# Patient Record
Sex: Female | Born: 1985 | Race: Black or African American | Hispanic: No | Marital: Single | Smoking: Never smoker
Health system: Southern US, Community
[De-identification: ages and names within clinical notes are randomized; demographics above are authoritative.]

## PROBLEM LIST (undated history)

## (undated) DIAGNOSIS — R112 Nausea with vomiting, unspecified: Secondary | ICD-10-CM

## (undated) DIAGNOSIS — Z9889 Other specified postprocedural states: Secondary | ICD-10-CM

## (undated) DIAGNOSIS — E559 Vitamin D deficiency, unspecified: Secondary | ICD-10-CM

## (undated) DIAGNOSIS — F32A Depression, unspecified: Secondary | ICD-10-CM

## (undated) DIAGNOSIS — F419 Anxiety disorder, unspecified: Secondary | ICD-10-CM

## (undated) DIAGNOSIS — F329 Major depressive disorder, single episode, unspecified: Secondary | ICD-10-CM

## (undated) DIAGNOSIS — B019 Varicella without complication: Secondary | ICD-10-CM

## (undated) DIAGNOSIS — K829 Disease of gallbladder, unspecified: Secondary | ICD-10-CM

## (undated) DIAGNOSIS — D649 Anemia, unspecified: Secondary | ICD-10-CM

## (undated) DIAGNOSIS — K219 Gastro-esophageal reflux disease without esophagitis: Secondary | ICD-10-CM

## (undated) HISTORY — DX: Disease of gallbladder, unspecified: K82.9

## (undated) HISTORY — DX: Depression, unspecified: F32.A

## (undated) HISTORY — PX: CHOLECYSTECTOMY: SHX55

## (undated) HISTORY — DX: Anxiety disorder, unspecified: F41.9

## (undated) HISTORY — DX: Varicella without complication: B01.9

## (undated) HISTORY — DX: Vitamin D deficiency, unspecified: E55.9

## (undated) HISTORY — PX: WISDOM TOOTH EXTRACTION: SHX21

## (undated) SURGERY — MANOMETRY, ESOPHAGUS

---
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
## 2010-04-07 ENCOUNTER — Ambulatory Visit (HOSPITAL_COMMUNITY): Admission: RE | Admit: 2010-04-07 | Discharge: 2010-04-07 | Payer: Self-pay | Admitting: Gastroenterology

## 2010-07-05 ENCOUNTER — Emergency Department (HOSPITAL_COMMUNITY): Admission: EM | Admit: 2010-07-05 | Discharge: 2010-07-05 | Payer: Self-pay | Admitting: Emergency Medicine

## 2010-10-01 ENCOUNTER — Inpatient Hospital Stay (HOSPITAL_COMMUNITY)
Admission: AD | Admit: 2010-10-01 | Discharge: 2010-10-01 | Payer: Self-pay | Source: Home / Self Care | Attending: Obstetrics and Gynecology | Admitting: Obstetrics and Gynecology

## 2010-12-26 LAB — URINALYSIS, ROUTINE W REFLEX MICROSCOPIC
Leukocytes, UA: NEGATIVE
Nitrite: NEGATIVE
Specific Gravity, Urine: >1.03 — ABNORMAL HIGH (ref 1.005–1.030)
Urobilinogen, UA: 0.2 mg/dL (ref 0.0–1.0)
pH: 6 (ref 5.0–8.0)

## 2010-12-26 LAB — COMPREHENSIVE METABOLIC PANEL
AST: 23 U/L (ref 0–37)
BUN: 8 mg/dL (ref 6–23)
CO2: 23 mEq/L (ref 19–32)
Calcium: 9.6 mg/dL (ref 8.4–10.5)
Creatinine, Ser: 0.72 mg/dL (ref 0.4–1.2)
GFR calc Af Amer: >60 mL/min (ref >60)
GFR calc non Af Amer: >60 mL/min (ref >60)
Total Bilirubin: 0.5 mg/dL (ref 0.3–1.2)

## 2010-12-26 LAB — CBC
Hemoglobin: 12.6 g/dL (ref 12.0–15.0)
MCH: 28 pg (ref 26.0–34.0)
MCHC: 32.9 g/dL (ref 30.0–36.0)
MCV: 85.1 fL (ref 78.0–100.0)
Platelets: 322 10*3/uL (ref 150–400)

## 2010-12-26 LAB — URINE CULTURE: Culture  Setup Time: 201112171056

## 2010-12-26 LAB — URINE MICROSCOPIC-ADD ON

## 2012-03-29 ENCOUNTER — Ambulatory Visit (INDEPENDENT_AMBULATORY_CARE_PROVIDER_SITE_OTHER): Payer: Managed Care, Other (non HMO) | Admitting: Emergency Medicine

## 2012-03-29 ENCOUNTER — Telehealth: Payer: Self-pay

## 2012-03-29 VITALS — BP 128/87 | HR 108 | Temp 99.0°F | Resp 16 | Ht 65.0 in | Wt 260.0 lb

## 2012-03-29 DIAGNOSIS — J018 Other acute sinusitis: Secondary | ICD-10-CM

## 2012-03-29 DIAGNOSIS — J020 Streptococcal pharyngitis: Secondary | ICD-10-CM

## 2012-03-29 DIAGNOSIS — J40 Bronchitis, not specified as acute or chronic: Secondary | ICD-10-CM

## 2012-03-29 MED ORDER — CEFPROZIL 500 MG PO TABS: 500.0000 mg | ORAL_TABLET | Freq: Two times a day (BID) | ORAL | Status: AC

## 2012-03-29 NOTE — Progress Notes (Signed)
  Subjective:    Patient ID: Krystal Harris, female    DOB: [birth year], 26 y.o.   MRN: [id number]  Sore Throat  This is a new problem. The current episode started yesterday. The problem has been unchanged. Neither side of throat is experiencing more pain than the other. The maximum temperature recorded prior to her arrival was 100 - 100.9 F. The fever has been present for less than 1 day. The pain is at a severity of 3/10. The pain is mild. Associated symptoms include coughing, neck pain and swollen glands. Pertinent negatives include no abdominal pain, congestion, diarrhea, drooling, ear discharge, ear pain, headaches, hoarse voice, plugged ear sensation, shortness of breath, stridor, trouble swallowing or vomiting. She has had no exposure to strep. She has tried acetaminophen for the symptoms. The treatment provided no relief.  Cough This is a new problem. The current episode started in the past 7 days. The problem has been unchanged. The problem occurs every few hours. The cough is productive of purulent sputum. Associated symptoms include a fever, nasal congestion, postnasal drip and a sore throat. Pertinent negatives include no chest pain, chills, ear congestion, ear pain, headaches, heartburn, hemoptysis, myalgias, rash, rhinorrhea, shortness of breath, sweats, weight loss or wheezing. Nothing aggravates the symptoms. She has tried nothing for the symptoms. The treatment provided no relief. Her past medical history is significant for pneumonia. There is no history of asthma, bronchiectasis, bronchitis, COPD, emphysema or environmental allergies.  Fever  Associated symptoms include coughing and a sore throat. Pertinent negatives include no abdominal pain, chest pain, congestion, diarrhea, ear pain, headaches, rash, vomiting or wheezing.      Review of Systems  Constitutional: Positive for fever, appetite change and fatigue. Negative for chills and weight loss.  HENT: Positive for sore throat, neck  pain and postnasal drip. Negative for ear pain, congestion, hoarse voice, rhinorrhea, drooling, trouble swallowing and ear discharge.   Eyes: Negative.   Respiratory: Positive for cough. Negative for hemoptysis, chest tightness, shortness of breath, wheezing and stridor.   Cardiovascular: Negative for chest pain.  Gastrointestinal: Negative.  Negative for heartburn, vomiting, abdominal pain and diarrhea.  Genitourinary: Negative.   Musculoskeletal: Negative for myalgias.  Skin: Negative.  Negative for rash.  Neurological: Negative for headaches.  Hematological: Negative for environmental allergies.       Objective:   Physical Exam  Nursing note and vitals reviewed. Constitutional: She appears well-developed and well-nourished.  HENT:  Head: Normocephalic and atraumatic.  Right Ear: External ear normal.  Left Ear: External ear normal.  Nose: Mucosal edema present. Right sinus exhibits maxillary sinus tenderness. Left sinus exhibits maxillary sinus tenderness.  Mouth/Throat: Oropharyngeal exudate, posterior oropharyngeal edema and posterior oropharyngeal erythema present.  Eyes: Conjunctivae and EOM are normal. Pupils are equal, round, and reactive to light.  Neck: Normal range of motion. Neck supple.  Cardiovascular: Normal rate and regular rhythm.   Pulmonary/Chest: Effort normal and breath sounds normal. No respiratory distress. She has no wheezes. She has no rales.  Abdominal: Soft.  Musculoskeletal: Normal range of motion.  Lymphadenopathy:    She has cervical adenopathy.  Neurological: She is alert.  Skin: Skin is warm and dry.          Assessment & Plan:

## 2012-03-29 NOTE — Telephone Encounter (Signed)
Patient calling in regards to prescription. States that pharmacy does not have it.

## 2012-03-29 NOTE — Telephone Encounter (Signed)
Called pharmacy and confirmed that they have. Advised patient.

## 2012-04-07 ENCOUNTER — Ambulatory Visit (INDEPENDENT_AMBULATORY_CARE_PROVIDER_SITE_OTHER): Payer: Managed Care, Other (non HMO) | Admitting: Emergency Medicine

## 2012-04-07 VITALS — BP 123/85 | HR 85 | Temp 98.7°F | Resp 16 | Ht 65.0 in | Wt 265.0 lb

## 2012-04-07 DIAGNOSIS — J029 Acute pharyngitis, unspecified: Secondary | ICD-10-CM

## 2012-04-07 NOTE — Progress Notes (Signed)
       Patient Name: Krystal Harris Date of Birth: [birth year] Medical Record Number: [id number] Gender: female Date of Encounter: 04/07/2012  History of Present Illness:  Krystal Harris is a 26 y.o. very pleasant female patient who presents with the following:  Treated for sinusitis and has persistent sore throat and one day of low grade fever.  Says her sinusitis resolved.      There is no problem list on file for this patient.  No past medical history on file. No past surgical history on file. History  Substance Use Topics  . Smoking status: Never Smoker   . Smokeless tobacco: Not on file  . Alcohol Use: Not on file   No family history on file. Allergies  Allergen Reactions  . Penicillins Nausea And Vomiting    Medication list has been reviewed and updated.  Prior to Admission medications   Medication Sig Start Date End Date Taking? Authorizing Provider  acetaminophen (TYLENOL) 325 MG tablet Take 650 mg by mouth every 6 (six) hours as needed.    Historical Provider, MD  cefPROZIL (CEFZIL) 500 MG tablet Take 1 tablet (500 mg total) by mouth 2 (two) times daily. 03/29/12 04/08/12  Phillips Odor, MD    Review of Systems:  As per HPI, otherwise negative.    Physical Examination: Filed Vitals:   04/07/12 1558  BP: 123/85  Pulse: 85  Temp: 98.7 F (37.1 C)  Resp: 16   Filed Vitals:   04/07/12 1558  Height: 5\' 5"  (1.651 m)  Weight: 265 lb (120.203 kg)   Body mass index is 44.10 kg/(m^2). Ideal Body Weight: Weight in (lb) to have BMI = 25: 149.9   GEN: WDWN, NAD, Non-toxic, A & O x 3 HEENT: Atraumatic, Normocephalic. Neck supple. No masses, No LAD.  Pharynx erythematous Ears and Nose: No external deformity. CV: RRR, No M/G/R. No JVD. No thrill. No extra heart sounds. PULM: CTA B, no wheezes, crackles, rhonchi. No retractions. No resp. distress. No accessory muscle use. ABD: S, NT, ND, +BS. No rebound. No HSM. EXTR: No c/c/e NEURO Normal gait.  PSYCH:  Normally interactive. Conversant. Not depressed or anxious appearing.  Calm demeanor.    Assessment and Plan: Pharyngitis Await mono  Carmelina Dane, MD

## 2012-04-09 LAB — EPSTEIN-BARR VIRUS VCA ANTIBODY PANEL
EBV EA IgG: <5 U/mL (ref <9.0)
EBV NA IgG: <3 U/mL (ref <18.0)
EBV VCA IgM: <10 U/mL (ref <36.0)

## 2012-04-11 ENCOUNTER — Telehealth: Payer: Self-pay

## 2012-04-11 ENCOUNTER — Encounter: Payer: Self-pay | Admitting: Emergency Medicine

## 2012-04-11 NOTE — Telephone Encounter (Signed)
EBV titer negative

## 2012-04-11 NOTE — Telephone Encounter (Signed)
Pt.notified

## 2012-04-11 NOTE — Telephone Encounter (Signed)
Pt states her throat is still sore and can't really afford to come in a third time. Please advise.

## 2012-04-11 NOTE — Telephone Encounter (Signed)
PT STATES SHE WAS SUPPOSE TO HAVE GOTTEN A CALL YESTERDAY REGARDING HER LABS AND DIDN'T HEAR FROM ANYONE PLEASE CALL [phone] AND SHE HOPE IT IS TODAY

## 2012-04-11 NOTE — Telephone Encounter (Signed)
Can someone review labs for Dr Dareen Piano today for pt and let us know what to tell her. EBV titer?

## 2013-02-21 ENCOUNTER — Ambulatory Visit (INDEPENDENT_AMBULATORY_CARE_PROVIDER_SITE_OTHER): Payer: Managed Care, Other (non HMO) | Admitting: Family Medicine

## 2013-02-21 ENCOUNTER — Encounter: Payer: Self-pay | Admitting: Family Medicine

## 2013-02-21 VITALS — BP 130/90 | HR 95 | Temp 98.9°F | Ht 64.75 in | Wt 257.2 lb

## 2013-02-21 DIAGNOSIS — M545 Low back pain: Secondary | ICD-10-CM

## 2013-02-21 DIAGNOSIS — M25472 Effusion, left ankle: Secondary | ICD-10-CM

## 2013-02-21 DIAGNOSIS — Z0000 Encounter for general adult medical examination without abnormal findings: Secondary | ICD-10-CM | POA: Insufficient documentation

## 2013-02-21 DIAGNOSIS — M25473 Effusion, unspecified ankle: Secondary | ICD-10-CM

## 2013-02-21 DIAGNOSIS — Z1331 Encounter for screening for depression: Secondary | ICD-10-CM

## 2013-02-21 LAB — CBC WITH DIFFERENTIAL/PLATELET
Basophils Relative: 0.5 % (ref 0.0–3.0)
Eosinophils Absolute: 0.1 10*3/uL (ref 0.0–0.7)
Eosinophils Relative: 0.9 % (ref 0.0–5.0)
Hemoglobin: 11.8 g/dL — ABNORMAL LOW (ref 12.0–15.0)
Lymphocytes Relative: 13.2 % (ref 12.0–46.0)
MCHC: 33.1 g/dL (ref 30.0–36.0)
Monocytes Relative: 6 % (ref 3.0–12.0)
Neutro Abs: 5.7 10*3/uL (ref 1.4–7.7)
Neutrophils Relative %: 79.4 % — ABNORMAL HIGH (ref 43.0–77.0)
RBC: 4.17 Mil/uL (ref 3.87–5.11)
WBC: 7.1 10*3/uL (ref 4.5–10.5)

## 2013-02-21 LAB — HEPATIC FUNCTION PANEL
ALT: 10 U/L (ref 0–35)
AST: 16 U/L (ref 0–37)
Total Bilirubin: 0.4 mg/dL (ref 0.3–1.2)
Total Protein: 7.9 g/dL (ref 6.0–8.3)

## 2013-02-21 LAB — BASIC METABOLIC PANEL
BUN: 9 mg/dL (ref 6–23)
CO2: 25 mEq/L (ref 19–32)
Chloride: 103 mEq/L (ref 96–112)
Creatinine, Ser: 0.7 mg/dL (ref 0.4–1.2)
Potassium: 4 mEq/L (ref 3.5–5.1)

## 2013-02-21 LAB — LIPID PANEL
Cholesterol: 150 mg/dL (ref 0–200)
LDL Cholesterol: 89 mg/dL (ref 0–99)
VLDL: 9.4 mg/dL (ref 0.0–40.0)

## 2013-02-21 LAB — TSH: TSH: 1.44 u[IU]/mL (ref 0.35–5.50)

## 2013-02-21 NOTE — Progress Notes (Signed)
  Subjective:    Patient ID: Krystal Harris, female    DOB: 1986-04-03, 27 y.o.   MRN: [id number]  HPI New to establish.  Previous MD- [facility]  GYN- Cousins (UTD on pap)  L ankle swelling- started in January.  No known injury.  Swelling is constant.  No pain.  Will ice and elevate but swelling continues.  R low back pain- achy, intermittent.  Resolves spontaneously.  Not related to particular activity.  No radiation of pain.  No bowel or bladder incontinence.  sxs started 'a couple of months ago'.  No known injury.  Improves w/ ibuprofen.   Review of Systems Patient reports no vision/ hearing changes, adenopathy,fever, weight change,  persistant/recurrent hoarseness , swallowing issues, chest pain, palpitations, persistant/recurrent cough, hemoptysis, dyspnea (rest/exertional/paroxysmal nocturnal), gastrointestinal bleeding (melena, rectal bleeding), abdominal pain, significant heartburn, bowel changes, GU symptoms (dysuria, hematuria, incontinence), Gyn symptoms (abnormal  bleeding, pain),  syncope, focal weakness, memory loss, numbness & tingling, skin/hair/nail changes, abnormal bruising or bleeding, anxiety, or depression.     Objective:   Physical Exam General Appearance:    Alert, cooperative, no distress, appears stated age  Head:    Normocephalic, without obvious abnormality, atraumatic  Eyes:    PERRL, conjunctiva/corneas clear, EOM's intact, fundi    benign, both eyes  Ears:    Normal TM's and external ear canals, both ears  Nose:   Nares normal, septum midline, mucosa normal, no drainage    or sinus tenderness  Throat:   Lips, mucosa, and tongue normal; teeth and gums normal  Neck:   Supple, symmetrical, trachea midline, no adenopathy;    Thyroid: no enlargement/tenderness/nodules  Back:     Symmetric, no curvature, ROM normal, no CVA tenderness  Lungs:     Clear to auscultation bilaterally, respirations unlabored  Chest Wall:    No tenderness or deformity   Heart:    Regular rate  and rhythm, S1 and S2 normal, no murmur, rub   or gallop  Breast Exam:    Deferred to GYN  Abdomen:     Soft, non-tender, bowel sounds active all four quadrants,    no masses, no organomegaly  Genitalia:    Deferred to GYN  Rectal:    Extremities:   Extremities normal, atraumatic, no cyanosis.  Mild swelling of L ankle bursa anterior to lateral malleolus- not tender  Pulses:   2+ and symmetric all extremities  Skin:   Skin color, texture, turgor normal, no rashes or lesions  Lymph nodes:   Cervical, supraclavicular, and axillary nodes normal  Neurologic:   CNII-XII intact, normal strength, sensation and reflexes    throughout          Assessment & Plan:

## 2013-02-21 NOTE — Patient Instructions (Addendum)
We'll notify you of your lab results and make any changes if needed Ice and elevate your ankle as needed.  Make sure you are wearing good supportive shoes The back pain is a spasm- do some gentle stretching, ibuprofen as needed, HEAT! Try and make healthy food choices and get regular exercise Call with any questions or concerns Welcome!  We're glad to have you!

## 2013-02-23 NOTE — Assessment & Plan Note (Signed)
New.  Pt's PE WNL today.  Pain is intermittent.  Based on pt's description sounds consistent w/ spasm.  Encouraged gentle stretching, regular exercise, weight loss, heating pad and ibuprofen prn.  Will follow.

## 2013-02-23 NOTE — Assessment & Plan Note (Signed)
New.  Minimal swelling today.  Not painful.  Appears to be consistent w/ bursa swelling.  No hx of injury.  No obvious cause.  Will continue to follow.

## 2013-02-23 NOTE — Assessment & Plan Note (Signed)
New.  Pt's PE WNL w/ exception of obesity and mild bursa swelling of L ankle.  UTD on GYN.  Check labs.  Anticipatory guidance provided.

## 2013-02-26 LAB — VITAMIN D 1,25 DIHYDROXY
Vitamin D 1, 25 (OH)2 Total: 53 pg/mL (ref 18–72)
Vitamin D2 1, 25 (OH)2: 9 pg/mL
Vitamin D3 1, 25 (OH)2: 44 pg/mL

## 2013-03-21 ENCOUNTER — Ambulatory Visit: Payer: Self-pay | Admitting: Family Medicine

## 2013-08-21 ENCOUNTER — Other Ambulatory Visit: Payer: Self-pay

## 2013-09-15 ENCOUNTER — Ambulatory Visit (INDEPENDENT_AMBULATORY_CARE_PROVIDER_SITE_OTHER): Payer: Managed Care, Other (non HMO) | Admitting: Internal Medicine

## 2013-09-15 ENCOUNTER — Ambulatory Visit: Payer: Managed Care, Other (non HMO)

## 2013-09-15 VITALS — BP 122/90 | HR 88 | Temp 98.3°F | Resp 16 | Ht 64.5 in | Wt 252.8 lb

## 2013-09-15 DIAGNOSIS — S90851A Superficial foreign body, right foot, initial encounter: Secondary | ICD-10-CM

## 2013-09-15 DIAGNOSIS — Z23 Encounter for immunization: Secondary | ICD-10-CM

## 2013-09-15 DIAGNOSIS — M79609 Pain in unspecified limb: Secondary | ICD-10-CM

## 2013-09-15 DIAGNOSIS — M79671 Pain in right foot: Secondary | ICD-10-CM

## 2013-09-15 DIAGNOSIS — IMO0002 Reserved for concepts with insufficient information to code with codable children: Secondary | ICD-10-CM

## 2013-09-15 NOTE — Progress Notes (Addendum)
   Subjective:    Patient ID: Krystal Harris, female    DOB: 08-Feb-1986, 27 y.o.   MRN: [id number] This chart was scribed for Ellamae Sia, MD by Clydene Laming, ED Scribe. This patient was seen in room 7 and the patient's care was started at 8:08 PM. HPI HPI Comments: Krystal Harris is a 27 y.o. female who presents to the [facility] complaining of a needle breaking off into the bottom of her right foot. Pt was setting up her Xbox when the incident occurred. She states the needle fell out of a bag on the floor.    Review of Systems  Skin: Positive for wound (right foot needle wound ).       Objective:   Physical Exam Filed Vitals:   09/15/13 1957  BP: 122/90  Pulse: 88  Temp: 98.3 F (36.8 C)  TempSrc: Oral  Resp: 16  Height: 5' 4.5" (1.638 m)  Weight: 252 lb 12.8 oz (114.669 kg)  SpO2: 100%   Entrance wound pinpoint mid sole Tender w/ palp lateral side of wound w/out FB palpation  [facility] reading (PRIMARY) by  Dr. Annaston Upham=FB midfoot       Assessment & Plan:  8:13 PM- Discussed treatment plan with pt at bedside. Pt verbalized understanding and agreement with plan.  I personally performed the services described in this documentation, which was scribed in my presence. The recorded information has been reviewed and is accurate.  Foreign body in foot, right, initial encounter - Plan: DG Foot 2 Views Right, TraMADol HCl 50 MG TBDP, Ambulatory referral to Orthopedic Surgery,   Need for prophylactic vaccination with combined diphtheria-tetanus-pertussis (DTP) vaccine - Plan: Tdap vaccine greater than or equal to 7yo IM  Foot pain, right - Plan: DG Foot 2 Views Right, TraMADol HCl 50 MG TBDP, Ambulatory referral to Orthopedic Surgery-[facility]

## 2013-09-15 NOTE — Progress Notes (Signed)
Procedure Note: Verbal consent obtained from the patient.  Local anesthesia with 1.5cc 2% lidocaine with epinephrine.  Betadine prep.  Sterile technique employed.  Puncture wound extended by approx 2mm proximally and distally.  Wound explored with splinter forceps.  Edge of the foreign body was palpated several times but could not be grasped after multiple attempts by Dr. Merla Riches and myself.  Wound cleansed and dressed.  Will refer patient to surgery for removal of foreign body.

## 2013-09-16 ENCOUNTER — Encounter (HOSPITAL_COMMUNITY): Payer: Self-pay | Admitting: *Deleted

## 2013-09-16 ENCOUNTER — Other Ambulatory Visit (HOSPITAL_COMMUNITY): Payer: Self-pay | Admitting: Orthopedic Surgery

## 2013-09-16 ENCOUNTER — Other Ambulatory Visit (HOSPITAL_COMMUNITY): Payer: Self-pay | Admitting: *Deleted

## 2013-09-16 ENCOUNTER — Encounter (HOSPITAL_COMMUNITY): Payer: Self-pay | Admitting: Pharmacist

## 2013-09-16 MED ORDER — TRAMADOL HCL 50 MG PO TBDP: ORAL_TABLET | ORAL | Status: DC

## 2013-09-16 NOTE — Progress Notes (Signed)
Pt called with time change, instructed pt to be here at 10:30 AM.

## 2013-09-17 ENCOUNTER — Ambulatory Visit (HOSPITAL_COMMUNITY): Payer: Managed Care, Other (non HMO) | Admitting: Anesthesiology

## 2013-09-17 ENCOUNTER — Encounter (HOSPITAL_COMMUNITY): Payer: Managed Care, Other (non HMO) | Admitting: Anesthesiology

## 2013-09-17 ENCOUNTER — Ambulatory Visit (HOSPITAL_COMMUNITY)
Admission: RE | Admit: 2013-09-17 | Discharge: 2013-09-17 | Disposition: A | Discharge status: Home / Self Care | Payer: Managed Care, Other (non HMO) | Source: Ambulatory Visit | Attending: Orthopedic Surgery | Admitting: Orthopedic Surgery

## 2013-09-17 ENCOUNTER — Encounter (HOSPITAL_COMMUNITY)
Admission: RE | Disposition: A | Discharge status: Home / Self Care | Payer: Self-pay | Source: Ambulatory Visit | Attending: Orthopedic Surgery

## 2013-09-17 DIAGNOSIS — W278XXA Contact with other nonpowered hand tool, initial encounter: Secondary | ICD-10-CM | POA: Insufficient documentation

## 2013-09-17 DIAGNOSIS — Z880 Allergy status to penicillin: Secondary | ICD-10-CM | POA: Insufficient documentation

## 2013-09-17 DIAGNOSIS — S91309A Unspecified open wound, unspecified foot, initial encounter: Secondary | ICD-10-CM | POA: Insufficient documentation

## 2013-09-17 DIAGNOSIS — Z1811 Retained magnetic metal fragments: Secondary | ICD-10-CM | POA: Insufficient documentation

## 2013-09-17 DIAGNOSIS — S90851A Superficial foreign body, right foot, initial encounter: Secondary | ICD-10-CM

## 2013-09-17 HISTORY — PX: FOREIGN BODY REMOVAL: SHX962

## 2013-09-17 LAB — COMPREHENSIVE METABOLIC PANEL
AST: 11 U/L (ref 0–37)
Albumin: 3.4 g/dL — ABNORMAL LOW (ref 3.5–5.2)
Alkaline Phosphatase: 99 U/L (ref 39–117)
BUN: 8 mg/dL (ref 6–23)
Calcium: 9.3 mg/dL (ref 8.4–10.5)
Creatinine, Ser: 0.67 mg/dL (ref 0.50–1.10)
GFR calc Af Amer: >90 mL/min (ref >90)
Total Protein: 7.9 g/dL (ref 6.0–8.3)

## 2013-09-17 LAB — CBC
MCH: 28.6 pg (ref 26.0–34.0)
MCHC: 32.4 g/dL (ref 30.0–36.0)
MCV: 88.3 fL (ref 78.0–100.0)
Platelets: 316 10*3/uL (ref 150–400)
RDW: 13 % (ref 11.5–15.5)
WBC: 6.8 10*3/uL (ref 4.0–10.5)

## 2013-09-17 SURGERY — REMOVAL FOREIGN BODY EXTREMITY
Anesthesia: General | Site: Foot | Laterality: Right

## 2013-09-17 MED ORDER — MEPERIDINE HCL 25 MG/ML IJ SOLN: 6.2500 mg | INTRAMUSCULAR | Status: DC | PRN

## 2013-09-17 MED ORDER — OXYCODONE HCL 5 MG/5ML PO SOLN: 5.0000 mg | Freq: Once | ORAL | Status: DC | PRN

## 2013-09-17 MED ORDER — MIDAZOLAM HCL 5 MG/5ML IJ SOLN
INTRAMUSCULAR | Status: DC | PRN
  Administered 2013-09-17: 2 mg via INTRAVENOUS

## 2013-09-17 MED ORDER — HYDROMORPHONE HCL PF 1 MG/ML IJ SOLN: 0.2500 mg | INTRAMUSCULAR | Status: DC | PRN

## 2013-09-17 MED ORDER — 0.9 % SODIUM CHLORIDE (POUR BTL) OPTIME
TOPICAL | Status: DC | PRN
  Administered 2013-09-17: 1000 mL

## 2013-09-17 MED ORDER — OXYCODONE HCL 5 MG PO TABS: 5.0000 mg | ORAL_TABLET | Freq: Once | ORAL | Status: DC | PRN

## 2013-09-17 MED ORDER — MIDAZOLAM HCL 2 MG/2ML IJ SOLN: 0.5000 mg | Freq: Once | INTRAMUSCULAR | Status: DC | PRN

## 2013-09-17 MED ORDER — ONDANSETRON HCL 4 MG/2ML IJ SOLN
INTRAMUSCULAR | Status: DC | PRN
  Administered 2013-09-17: 4 mg via INTRAVENOUS

## 2013-09-17 MED ORDER — LACTATED RINGERS IV SOLN
INTRAVENOUS | Status: DC | PRN
  Administered 2013-09-17 (×2): via INTRAVENOUS

## 2013-09-17 MED ORDER — LIDOCAINE HCL (CARDIAC) 20 MG/ML IV SOLN
INTRAVENOUS | Status: DC | PRN
  Administered 2013-09-17: 50 mg via INTRAVENOUS

## 2013-09-17 MED ORDER — FENTANYL CITRATE 0.05 MG/ML IJ SOLN
INTRAMUSCULAR | Status: DC | PRN
  Administered 2013-09-17 (×3): 50 ug via INTRAVENOUS
  Administered 2013-09-17: 100 ug via INTRAVENOUS

## 2013-09-17 MED ORDER — CLINDAMYCIN PHOSPHATE 900 MG/50ML IV SOLN
900.0000 mg | INTRAVENOUS | Status: AC
  Administered 2013-09-17: 900 mg via INTRAVENOUS
  Filled 2013-09-17: qty 50

## 2013-09-17 MED ORDER — LIDOCAINE HCL (PF) 1 % IJ SOLN
INTRAMUSCULAR | Status: AC
  Filled 2013-09-17: qty 30

## 2013-09-17 MED ORDER — LACTATED RINGERS IV SOLN
INTRAVENOUS | Status: DC
  Administered 2013-09-17: 12:00:00 via INTRAVENOUS

## 2013-09-17 MED ORDER — PROMETHAZINE HCL 25 MG/ML IJ SOLN: 6.2500 mg | INTRAMUSCULAR | Status: DC | PRN

## 2013-09-17 MED ORDER — HYDROMORPHONE HCL PF 1 MG/ML IJ SOLN
INTRAMUSCULAR | Status: DC | PRN
  Administered 2013-09-17: .1 mg via INTRAVENOUS
  Administered 2013-09-17 (×2): .2 mg via INTRAVENOUS
  Administered 2013-09-17: .1 mg via INTRAVENOUS
  Administered 2013-09-17: .2 mg via INTRAVENOUS
  Administered 2013-09-17 (×2): .1 mg via INTRAVENOUS

## 2013-09-17 MED ORDER — LIDOCAINE HCL 1 % IJ SOLN
INTRAMUSCULAR | Status: DC | PRN
  Administered 2013-09-17: 10 mL

## 2013-09-17 MED ORDER — PROPOFOL 10 MG/ML IV BOLUS
INTRAVENOUS | Status: DC | PRN
  Administered 2013-09-17: 200 mg via INTRAVENOUS

## 2013-09-17 SURGICAL SUPPLY — 56 items
BLADE AVERAGE 25X9 (BLADE) IMPLANT
BLADE MINI RND TIP GREEN BEAV (BLADE) IMPLANT
BNDG COHESIVE 1X5 TAN STRL LF (GAUZE/BANDAGES/DRESSINGS) IMPLANT
BNDG COHESIVE 4X5 TAN STRL (GAUZE/BANDAGES/DRESSINGS) ×2 IMPLANT
BNDG COHESIVE 6X5 TAN STRL LF (GAUZE/BANDAGES/DRESSINGS) IMPLANT
BNDG ESMARK 4X9 LF (GAUZE/BANDAGES/DRESSINGS) IMPLANT
BNDG GAUZE ELAST 4 BULKY (GAUZE/BANDAGES/DRESSINGS) ×2 IMPLANT
BNDG GAUZE STRTCH 6 (GAUZE/BANDAGES/DRESSINGS) IMPLANT
CLOTH BEACON ORANGE TIMEOUT ST (SAFETY) ×2 IMPLANT
CORDS BIPOLAR (ELECTRODE) IMPLANT
COTTON STERILE ROLL (GAUZE/BANDAGES/DRESSINGS) IMPLANT
COVER SURGICAL LIGHT HANDLE (MISCELLANEOUS) ×4 IMPLANT
CUFF TOURNIQUET SINGLE 18IN (TOURNIQUET CUFF) IMPLANT
CUFF TOURNIQUET SINGLE 24IN (TOURNIQUET CUFF) IMPLANT
DRAPE OEC MINIVIEW 54X84 (DRAPES) ×2 IMPLANT
DRAPE U-SHAPE 47X51 STRL (DRAPES) ×2 IMPLANT
DRSG ADAPTIC 3X8 NADH LF (GAUZE/BANDAGES/DRESSINGS) IMPLANT
DRSG EMULSION OIL 3X3 NADH (GAUZE/BANDAGES/DRESSINGS) ×2 IMPLANT
DRSG PAD ABDOMINAL 8X10 ST (GAUZE/BANDAGES/DRESSINGS) ×2 IMPLANT
DURAPREP 26ML APPLICATOR (WOUND CARE) ×2 IMPLANT
ELECT REM PT RETURN 9FT ADLT (ELECTROSURGICAL) ×2
ELECTRODE REM PT RTRN 9FT ADLT (ELECTROSURGICAL) ×1 IMPLANT
GAUZE SPONGE 2X2 8PLY STRL LF (GAUZE/BANDAGES/DRESSINGS) IMPLANT
GLOVE BIOGEL PI IND STRL 6.5 (GLOVE) ×3 IMPLANT
GLOVE BIOGEL PI IND STRL 7.0 (GLOVE) ×1 IMPLANT
GLOVE BIOGEL PI IND STRL 9 (GLOVE) ×1 IMPLANT
GLOVE BIOGEL PI INDICATOR 6.5 (GLOVE) ×3
GLOVE BIOGEL PI INDICATOR 7.0 (GLOVE) ×1
GLOVE BIOGEL PI INDICATOR 9 (GLOVE) ×1
GLOVE SURG ORTHO 9.0 STRL STRW (GLOVE) ×2 IMPLANT
GLOVE SURG SS PI 6.5 STRL IVOR (GLOVE) ×2 IMPLANT
GOWN PREVENTION PLUS XLARGE (GOWN DISPOSABLE) ×2 IMPLANT
GOWN SRG XL XLNG 56XLVL 4 (GOWN DISPOSABLE) ×2 IMPLANT
GOWN STRL NON-REIN XL XLG LVL4 (GOWN DISPOSABLE) ×2
KIT BASIN OR (CUSTOM PROCEDURE TRAY) ×2 IMPLANT
KIT ROOM TURNOVER OR (KITS) ×2 IMPLANT
MANIFOLD NEPTUNE II (INSTRUMENTS) IMPLANT
NEEDLE HYPO 25GX1X1/2 BEV (NEEDLE) IMPLANT
NEEDLE SPNL 18GX3.5 QUINCKE PK (NEEDLE) ×4 IMPLANT
NS IRRIG 1000ML POUR BTL (IV SOLUTION) ×2 IMPLANT
PACK ORTHO EXTREMITY (CUSTOM PROCEDURE TRAY) ×2 IMPLANT
PAD ARMBOARD 7.5X6 YLW CONV (MISCELLANEOUS) ×4 IMPLANT
PAD CAST 4YDX4 CTTN HI CHSV (CAST SUPPLIES) IMPLANT
PADDING CAST COTTON 4X4 STRL (CAST SUPPLIES)
SPECIMEN JAR SMALL (MISCELLANEOUS) IMPLANT
SPONGE GAUZE 2X2 STER 10/PKG (GAUZE/BANDAGES/DRESSINGS)
SPONGE GAUZE 4X4 12PLY (GAUZE/BANDAGES/DRESSINGS) ×2 IMPLANT
SPONGE LAP 18X18 X RAY DECT (DISPOSABLE) ×2 IMPLANT
SUCTION FRAZIER TIP 10 FR DISP (SUCTIONS) ×2 IMPLANT
SUT ETHILON 2 0 FS 18 (SUTURE) ×2 IMPLANT
SUT VIC AB 2-0 FS1 27 (SUTURE) IMPLANT
SYR CONTROL 10ML LL (SYRINGE) ×2 IMPLANT
TOWEL OR 17X24 6PK STRL BLUE (TOWEL DISPOSABLE) ×2 IMPLANT
TOWEL OR 17X26 10 PK STRL BLUE (TOWEL DISPOSABLE) ×2 IMPLANT
TUBE CONNECTING 12X1/4 (SUCTIONS) ×2 IMPLANT
WATER STERILE IRR 1000ML POUR (IV SOLUTION) ×2 IMPLANT

## 2013-09-17 NOTE — Op Note (Signed)
OPERATIVE REPORT  DATE OF SURGERY: 09/17/2013  PATIENT:  Krystal Harris,  27 y.o. female  PRE-OPERATIVE DIAGNOSIS:  Foreign Body Right Foot  POST-OPERATIVE DIAGNOSIS:  Foreign Body Right Foot  PROCEDURE:  Procedure(s): REMOVAL FOREIGN BODY NEEDLE PLANTAR ASPECT OF THE FOOT  SURGEON:  Surgeon(s): Nadara Mustard, MD  ANESTHESIA:   general  EBL:  min ML  SPECIMEN:  No Specimen  TOURNIQUET:  * No tourniquets in log *  PROCEDURE DETAILS: Patient is a 27 year old woman who presents for removal of deep retained needle plantar aspect right foot. She had an attempt to remove this well at urgent care and they were unsuccessful. Risks and benefits were discussed including infection neurovascular injury pain infection. Patient states she understands and wished to proceed at this time. Description of procedure patient was brought to the operating room and underwent a general anesthetic. After adequate levels of anesthesia were obtained patient's right lower extremity was prepped using DuraPrep and draped into a sterile field. A plantar incision was made directly over the penetration of the needle. 18-gauge needles were then used to triangulate into the needle. The plantar fascia was incised. The needle was embedded in the plantar muscle. The plantar muscle was dissected in the needle was removed. The wound was irrigated with normal saline. The incision was closed using 2-0 nylon. The area was locally infiltrated with 1% lidocaine 10 cc. She tolerated this well sterile dressing was applied with Adaptic orthopedic sponges AB dressing Kerlix and Coban. Patient was extubated taken to the PACU in stable condition.  PLAN OF CARE: Discharge to home after PACU  PATIENT DISPOSITION:  PACU - hemodynamically stable.   Nadara Mustard, MD 09/17/2013 2:36 PM

## 2013-09-17 NOTE — Anesthesia Preprocedure Evaluation (Addendum)
Anesthesia Evaluation  Patient identified by MRN, date of birth, ID band Patient awake    Reviewed: Allergy & Precautions, H&P , NPO status , Patient's Chart, lab work & pertinent test results  History of Anesthesia Complications Negative for: history of anesthetic complications  Airway Mallampati: I TM Distance: >3 FB Neck ROM: Full    Dental  (+) Dental Advisory Given   Pulmonary neg pulmonary ROS,  breath sounds clear to auscultation  Pulmonary exam normal       Cardiovascular negative cardio ROS  Rhythm:Regular Rate:Normal     Neuro/Psych negative neurological ROS     GI/Hepatic negative GI ROS, Neg liver ROS,   Endo/Other  Morbid obesity  Renal/GU negative Renal ROS     Musculoskeletal   Abdominal (+) + obese,   Peds  Hematology  (+) Blood dyscrasia (Hb 11.8), anemia ,   Anesthesia Other Findings   Reproductive/Obstetrics 09/17/13 Preg test NEG                        Anesthesia Physical Anesthesia Plan  ASA: II  Anesthesia Plan: General   Post-op Pain Management:    Induction: Intravenous  Airway Management Planned: LMA  Additional Equipment:   Intra-op Plan:   Post-operative Plan:   Informed Consent: I have reviewed the patients History and Physical, chart, labs and discussed the procedure including the risks, benefits and alternatives for the proposed anesthesia with the patient or authorized representative who has indicated his/her understanding and acceptance.   Dental advisory given and Dental Advisory Given  Plan Discussed with: CRNA, Surgeon and Anesthesiologist  Anesthesia Plan Comments: (Plan routine monitors, GA- LMA OK)       Anesthesia Quick Evaluation

## 2013-09-17 NOTE — H&P (Signed)
Krystal Harris is an 27 y.o. female.   Chief Complaint: Foreign body needle right foot HPI: Patient is a 27 year old woman who states that she stepped on a needle in her midfoot right foot. Patient went to urgent care she states he spent several hours trying to get the foreign body out and they were unsuccessful she has pain with weightbearing.  Past Medical History  Diagnosis Date  . Chicken pox   . Vitamin D deficiency   . Kidney stones     Past Surgical History  Procedure Laterality Date  . Wisdom tooth extraction      Family History  Problem Relation Age of Onset  . Diabetes Mother   . Hypertension Mother   . Stroke Maternal Grandmother   . Cancer Maternal Grandfather     lung  . Diabetes Maternal Grandfather   . Stroke Maternal Grandfather   . Hypertension Maternal Grandfather    Social History:  reports that she has never smoked. She has never used smokeless tobacco. She reports that she drinks alcohol. She reports that she does not use illicit drugs.  Allergies:  Allergies  Allergen Reactions  . Penicillins Hives and Nausea And Vomiting    No prescriptions prior to admission    No results found for this or any previous visit (from the past 48 hour(s)). Dg Foot 2 Views Right  09/16/2013   CLINICAL DATA:  Right foot pain.  EXAM: RIGHT FOOT - 2 VIEW  COMPARISON:  None.  FINDINGS: There is no evidence of fracture or dislocation. There is no evidence of arthropathy or other focal bone abnormality. Linear metallic density consistent with needle is seen in the plantar soft tissues inferior to the cuboid bone.  IMPRESSION: No fracture or dislocation is noted. Probable needle foreign body is seen in plantar soft tissues as described above.   Electronically Signed   By: Roque Lias M.D.   On: 09/16/2013 08:43    Review of Systems  All other systems reviewed and are negative.    Last menstrual period 09/05/2013. Physical Exam   On examination patient has a palpable  dorsalis pedis pulse she has an entry wound on the mid plantar aspect of her foot. Radiograph shows a large foreign body needle approximately 2 cm in length. Assessment/Plan Assessment: Foreign body needle plantar aspect right foot.  Plan: Will plan for removal of foreign body. Risks and benefits were discussed including infection neurovascular injury. Patient states he understands was pursued this time7  DUDA,MARCUS V 09/17/2013, 6:26 AM

## 2013-09-17 NOTE — Anesthesia Procedure Notes (Signed)
Procedure Name: LMA Insertion Date/Time: 09/17/2013 1:32 PM Performed by: Carmela Rima Pre-anesthesia Checklist: Patient identified, Timeout performed, Emergency Drugs available, Suction available and Patient being monitored Patient Re-evaluated:Patient Re-evaluated prior to inductionOxygen Delivery Method: Circle system utilized Preoxygenation: Pre-oxygenation with 100% oxygen Intubation Type: IV induction Ventilation: Mask ventilation without difficulty LMA: LMA inserted LMA Size: 4.0 Tube type: Oral Number of attempts: 1 Placement Confirmation: positive ETCO2 Tube secured with: Tape Dental Injury: Teeth and Oropharynx as per pre-operative assessment

## 2013-09-17 NOTE — Transfer of Care (Signed)
Immediate Anesthesia Transfer of Care Note  Patient: Krystal Harris  Procedure(s) Performed: Procedure(s) with comments: REMOVAL FOREIGN BODY NEEDLE PLANTAR ASPECT OF THE FOOT (Right) - Remove Foreign Body Right Foot  Patient Location: PACU  Anesthesia Type:General  Level of Consciousness: awake, alert  and oriented  Airway & Oxygen Therapy: Patient Spontanous Breathing and Patient connected to nasal cannula oxygen  Post-op Assessment: Report given to PACU RN, Post -op Vital signs reviewed and stable and Patient moving all extremities X 4  Post vital signs: Reviewed and stable  Complications: No apparent anesthesia complications

## 2013-09-19 ENCOUNTER — Encounter (HOSPITAL_COMMUNITY): Payer: Self-pay | Admitting: Orthopedic Surgery

## 2013-10-01 NOTE — Anesthesia Postprocedure Evaluation (Signed)
Anesthesia Post Note  Patient: Krystal Harris  Procedure(s) Performed: Procedure(s) (LRB): REMOVAL FOREIGN BODY NEEDLE PLANTAR ASPECT OF THE FOOT (Right)  Anesthesia type: General  Patient location: PACU  Post pain: Pain level controlled and Adequate analgesia  Post assessment: Post-op Vital signs reviewed, Patient's Cardiovascular Status Stable, Respiratory Function Stable, Patent Airway and Pain level controlled  Last Vitals:  Filed Vitals:   09/17/13 1700  BP:   Pulse: 101  Temp: 37 C  Resp: 13    Post vital signs: Reviewed and stable  Level of consciousness: awake, alert  and oriented  Complications: No apparent anesthesia complications

## 2013-11-28 ENCOUNTER — Ambulatory Visit (INDEPENDENT_AMBULATORY_CARE_PROVIDER_SITE_OTHER): Payer: Managed Care, Other (non HMO) | Admitting: Podiatrist

## 2013-11-28 ENCOUNTER — Encounter: Payer: Self-pay | Admitting: Podiatrist

## 2013-11-28 VITALS — BP 131/86 | HR 93 | Resp 18

## 2013-11-28 DIAGNOSIS — IMO0002 Reserved for concepts with insufficient information to code with codable children: Secondary | ICD-10-CM

## 2013-11-28 DIAGNOSIS — L910 Hypertrophic scar: Secondary | ICD-10-CM

## 2013-11-28 DIAGNOSIS — M792 Neuralgia and neuritis, unspecified: Secondary | ICD-10-CM

## 2013-11-28 DIAGNOSIS — S8490XA Injury of unspecified nerve at lower leg level, unspecified leg, initial encounter: Secondary | ICD-10-CM

## 2013-11-28 MED ORDER — AMITRIPTYLINE HCL 25 MG PO TABS: 25.0000 mg | ORAL_TABLET | Freq: Every day | ORAL | Status: DC

## 2013-11-28 NOTE — Progress Notes (Signed)
   Subjective:    Patient ID: Krystal Harris, female    DOB: July 30, 1986, 28 y.o.   MRN: [id number]  HPI about 2 months ago I stepped on a needle and it broke off in my foot and went to urgent care and they did x-rays and I showed them the needle and they tried to get it out and sore and tender and no feeling in the toes and on the side and I pretty much have no feeling and was never put on any antibotics and they referred me to [facility] orthopaedic doctors to surgery on it and they got the needle out and the doctor states that it is nerve issues and was put on gabapentin and also duloxetine which has not helped    Review of Systems  All other systems reviewed and are negative.       Objective:   Physical Exam GENERAL APPEARANCE: Alert, conversant. Appropriately groomed. No acute distress.  VASCULAR: Pedal pulses palpable at 2/4 DP and PT bilateral.  Capillary refill time is immediate to all digits,  Proximal to distal cooling it warm to warm.   NEUROLOGIC: Subjectively the patient relates decreased sensation to the plantar aspect of the right foot along the incision line and distally. She relates palpation along the incision lines sends tingles to her toes. MUSCULOSKELETAL: Scar tissue is palpated along the incision line and medially. Discomfort with pressure is noted.  DERMATOLOGIC: keloid-type formation and scar tissue status post surgery to remove a needle from her right foot is present.     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 Then recommended physical therapy for the scar tissue has a as well as the nerve and she would like to try that. A prescription for SOS physical therapy was written. Also discussed the possibility of orthotics to take the pressure of the  instep on the right foot where the incision is based. 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 Ultimately recommend that she may need to see a neurologist regarding the pain in her right foot but for now we'll start with physical therapy and see how well she does.

## 2013-12-12 ENCOUNTER — Ambulatory Visit: Payer: Managed Care, Other (non HMO)

## 2013-12-26 ENCOUNTER — Ambulatory Visit (INDEPENDENT_AMBULATORY_CARE_PROVIDER_SITE_OTHER): Payer: Managed Care, Other (non HMO) | Admitting: Podiatrist

## 2013-12-26 VITALS — BP 124/82 | HR 102 | Resp 18

## 2013-12-26 DIAGNOSIS — L905 Scar conditions and fibrosis of skin: Secondary | ICD-10-CM

## 2013-12-26 NOTE — Progress Notes (Signed)
Went on a field trip today and this is the worse the foot has been and it went numb and I am  still going to therapy twice a week Subjective: Patient presents today for continued pain on the right foot where she's developed a large scar and keloid formation. She has been in physical therapy and has made minimal progress.  Objective: Large scar formation and keloid is present on the plantar aspect the right foot where previous incision and drainage was performed by another physician. Physical therapy has done little to improve the appearance of the scar formation and the pain she is experiencing.   Assessment: Keloid and scar right foot   Plan: Recommended injection therapy and the patient wishes to pursue this. I injected the keloid scar and scar tissue on the plantar aspect the right foot with Kenalog and Marcaine mixture. An I'll see her back in 2 weeks for second injection. Also recommended - aspirar cream and we will make sure this could ordered for her.

## 2014-01-09 ENCOUNTER — Encounter: Payer: Self-pay | Admitting: Podiatrist

## 2014-01-09 ENCOUNTER — Encounter: Payer: Self-pay | Admitting: *Deleted

## 2014-01-09 ENCOUNTER — Ambulatory Visit (INDEPENDENT_AMBULATORY_CARE_PROVIDER_SITE_OTHER): Payer: Managed Care, Other (non HMO) | Admitting: Podiatrist

## 2014-01-09 VITALS — BP 133/67 | HR 96 | Resp 12

## 2014-01-09 DIAGNOSIS — L910 Hypertrophic scar: Secondary | ICD-10-CM

## 2014-01-09 DIAGNOSIS — S8490XA Injury of unspecified nerve at lower leg level, unspecified leg, initial encounter: Secondary | ICD-10-CM

## 2014-01-09 NOTE — Progress Notes (Signed)
  Subjective: Patient presents today for continued pain on the right foot where she's developed a large scar and keloid formation. She has been in physical therapy and has made minimal progress. We performed a kenalog injection last visit and it has improved.  Objective: Large scar formation and keloid is present on the plantar aspect the right foot where previous incision and drainage was performed by another physician. Physical therapy has done little to improve the appearance of the scar formation and the pain she is experiencing.   Assessment: Keloid and scar right foot   Plan: Recommended continued injection therapy and the patient wishes to pursue this. I injected the keloid scar and scar tissue on the plantar aspect the right foot with Kenalog and Marcaine mixture. An I'll see her back in 2 weeks for a third injection. Also recommended - aspirar cream and we will make sure this Is ordered for her. I will do a nerve block prior to the kenalog therapy as to pepper in the medication.

## 2014-01-20 NOTE — Progress Notes (Signed)
Dr Valentina Lucks ordered cream (BDV) Tendinosis-Strictures-Scarring containing Baclofen 2%, Diclofenac 3%, Verapamil 10% dispense 240gm with 5 refills from [facility].  Faxed [phone].

## 2014-01-28 ENCOUNTER — Ambulatory Visit (INDEPENDENT_AMBULATORY_CARE_PROVIDER_SITE_OTHER): Payer: Managed Care, Other (non HMO) | Admitting: Podiatrist

## 2014-01-28 ENCOUNTER — Encounter: Payer: Self-pay | Admitting: Podiatrist

## 2014-01-28 VITALS — BP 121/80 | HR 94 | Resp 17 | Ht 64.5 in | Wt 252.0 lb

## 2014-01-28 DIAGNOSIS — L905 Scar conditions and fibrosis of skin: Secondary | ICD-10-CM

## 2014-01-28 DIAGNOSIS — M792 Neuralgia and neuritis, unspecified: Secondary | ICD-10-CM

## 2014-01-28 DIAGNOSIS — L910 Hypertrophic scar: Secondary | ICD-10-CM

## 2014-01-28 DIAGNOSIS — IMO0002 Reserved for concepts with insufficient information to code with codable children: Secondary | ICD-10-CM

## 2014-01-28 MED ORDER — TRIAMCINOLONE ACETONIDE 10 MG/ML IJ SUSP
10.0000 mg | Freq: Once | INTRAMUSCULAR | Status: AC
  Administered 2014-01-28: 10 mg

## 2014-01-28 NOTE — Progress Notes (Signed)
Subjective: Patient presents today for continued pain on the right foot where she's developed a large scar and keloid formation. She has been in physical therapy and has made minimal progress. We performed a second kenalog injection last visit and it has improved.  Objective: Large scar formation and keloid is present on the plantar aspect the right foot where previous incision and drainage was performed by another physician. Physical therapy has done little to improve the appearance of the scar formation and the pain she is experiencing. Injection therapy with kenalog is helping although uncomfortable for the patient.    Assessment: Keloid and scar right foot   Plan: Recommended continued injection therapy and the patient wishes to pursue this. I injected the keloid scar and scar tissue on the plantar aspect the right foot with Kenalog and Marcaine mixture. An I'll see her back in 4 weeks for consideration of a 4th injection.  May need to excise old surgical scar in an attempt to improve the discomfort.. Will consider this once we get the scar tissue worked out.

## 2014-02-20 ENCOUNTER — Encounter: Payer: Self-pay | Admitting: Podiatrist

## 2014-02-20 ENCOUNTER — Ambulatory Visit (INDEPENDENT_AMBULATORY_CARE_PROVIDER_SITE_OTHER): Payer: Managed Care, Other (non HMO) | Admitting: Podiatrist

## 2014-02-20 VITALS — BP 118/76 | HR 105 | Resp 18

## 2014-02-20 DIAGNOSIS — L905 Scar conditions and fibrosis of skin: Secondary | ICD-10-CM

## 2014-02-20 DIAGNOSIS — S8490XA Injury of unspecified nerve at lower leg level, unspecified leg, initial encounter: Secondary | ICD-10-CM

## 2014-02-20 DIAGNOSIS — L910 Hypertrophic scar: Secondary | ICD-10-CM

## 2014-02-20 MED ORDER — TRIAMCINOLONE ACETONIDE 40 MG/ML IJ SUSP: 20.0000 mg | Freq: Once | INTRAMUSCULAR | Status: DC

## 2014-02-20 NOTE — Patient Instructions (Signed)
Call when  You would like to consider surgery-- give [place] about 2-3 week lead time to secure your time slot for surgery.   We will be removing your scar tissue and sewing back up pretty.

## 2014-02-20 NOTE — Progress Notes (Signed)
My surgery was 09/17/13 on my right foot and it is doing good and I had a shot in it and now it feels like electric shock going thru it  Subjective: Patient presents today for continued pain on the right foot where she's developed a large scar and keloid formation. She has been in physical therapy and has made minimal progress. We performed a third kenalog injection last visit and it has improved.  Objective: Large scar formation and keloid is present on the plantar aspect the right foot where previous incision and drainage was performed by another physician. Physical therapy has done little to improve the appearance of the scar formation and the pain she is experiencing. Injection therapy with kenalog is helping although uncomfortable for the patient.  Assessment: Keloid and scar right foot  Plan: Recommended continued injection therapy and the patient wishes to pursue this. I injected the keloid scar and scar tissue on the plantar aspect the right foot with Kenalog and Marcaine mixture.  She is ready to have the keloid removed-- we will schedule at [facility] at her convenience.

## 2014-02-25 ENCOUNTER — Ambulatory Visit: Payer: Managed Care, Other (non HMO) | Admitting: Podiatrist

## 2014-02-25 DIAGNOSIS — L910 Hypertrophic scar: Secondary | ICD-10-CM

## 2014-03-04 ENCOUNTER — Encounter: Payer: Self-pay | Admitting: Podiatrist

## 2014-03-04 ENCOUNTER — Ambulatory Visit (INDEPENDENT_AMBULATORY_CARE_PROVIDER_SITE_OTHER): Payer: Self-pay | Admitting: Podiatrist

## 2014-03-04 VITALS — BP 155/85 | HR 97 | Temp 99.1°F | Resp 16

## 2014-03-04 DIAGNOSIS — L910 Hypertrophic scar: Secondary | ICD-10-CM

## 2014-03-04 DIAGNOSIS — Z9889 Other specified postprocedural states: Secondary | ICD-10-CM

## 2014-03-04 NOTE — Progress Notes (Signed)
   Subjective:    Patient ID: Krystal Harris, female    DOB: 29-Apr-1986, 28 y.o.   MRN: [id number]  HPI Pt states that she is still having some pain in right foot, "shooting" type pain that comes and goes.  Pain medications are effective in treating.   Review of Systems     Objective:   Physical Exam Neurovascular status is intact.  Her feeling is starting to return in the digits that has been absent for the last 5 months.  The incision is healing well.  Minimal area of dehiscence in the central portion of the incision.  Mild maceration is present along the suture line centrally as well.  No redness, no swelling, no malodor, no signs of infection are present locally or systemically.  Overall the foot is improving.    Assessment & Plan:  Status post excision of keloid scar right foot (2.0 cm)  Plan:  Cleansed the incision well and allowed it to dry out completely.  I then applied dermabond to the incision line to reinforce the incision line itself.  steristrips are applied and a dry, sterile, compression dressing is placed.  She will continue to wear the air fracture walker and use her knee scooter.  I will see her back in 1 week.  We will evaluate the foot to see if the suture line is ready to be removed.  Likely the sutures will need to stay in for 3-4 weeks.

## 2014-03-13 ENCOUNTER — Ambulatory Visit (INDEPENDENT_AMBULATORY_CARE_PROVIDER_SITE_OTHER): Payer: Managed Care, Other (non HMO) | Admitting: *Deleted

## 2014-03-13 ENCOUNTER — Encounter: Payer: Managed Care, Other (non HMO) | Admitting: Podiatrist

## 2014-03-13 DIAGNOSIS — S8490XA Injury of unspecified nerve at lower leg level, unspecified leg, initial encounter: Secondary | ICD-10-CM

## 2014-03-13 NOTE — Progress Notes (Signed)
   Subjective:    Patient ID: Krystal Harris, female    DOB: Nov 07, 1985, 28 y.o.   MRN: [id number]  HPI  PT LT FOOT ARCH IS NOT READY FOR SUTURE REMOVAL AT THIS POINT BECAUSE HAVE SLIGHT OPENING ON THE WOUND  WHERE THE SUTURE  BUT NO DRAINAGE. BUT  PT REQUESTED TO SEE DR. E AGAIN IF POSSIBLE.    Review of Systems     Objective:   Physical Exam        Assessment & Plan:

## 2014-03-25 ENCOUNTER — Encounter: Payer: Self-pay | Admitting: Podiatrist

## 2014-03-25 ENCOUNTER — Ambulatory Visit (INDEPENDENT_AMBULATORY_CARE_PROVIDER_SITE_OTHER): Payer: Managed Care, Other (non HMO) | Admitting: Podiatrist

## 2014-03-25 ENCOUNTER — Encounter: Payer: Managed Care, Other (non HMO) | Admitting: Podiatrist

## 2014-03-25 VITALS — BP 161/100 | HR 100 | Resp 16

## 2014-03-25 DIAGNOSIS — Z9889 Other specified postprocedural states: Secondary | ICD-10-CM

## 2014-03-25 DIAGNOSIS — L910 Hypertrophic scar: Secondary | ICD-10-CM

## 2014-03-26 NOTE — Progress Notes (Signed)
Subjective:  Patient presents for follow up of right foot where a keloid scar was removed.  She is 1 month post op.  She relates she stepped on the foot wrong and there was a little bleeding.  She bandaged the foot and states it is OK now.  She relates the pain has resolved and the foot is looking more normal to her.  She is still using her rolling knee scooter and has been out of work at [employer] her summer job.  Objective:  Neurovascular status unchanged.  The incision line is healing nicely and sutures are removed.  The wound is well coapted and dermabond is stil in place.  The keloid scar does not appear to be returning on her foot.  Pain has improved significantly  Assessment:  Status post excision keloid scar  Plan:  Sutures removed today.  steristrips applied.  Compressive dressing applied.  Recommended she continue using her rolling scooter.  She is to stay in the boot and she may get the foot wet now in the shower.  She can place minimal/ 10% body weight on the foot for balance only.  She may return to work with a non weight bearing restriction.  i will see her again in 2 weeks and will lift restictions based on the appearance of the foot.

## 2014-04-10 ENCOUNTER — Encounter: Payer: Self-pay | Admitting: Podiatrist

## 2014-04-10 ENCOUNTER — Ambulatory Visit (INDEPENDENT_AMBULATORY_CARE_PROVIDER_SITE_OTHER): Payer: Managed Care, Other (non HMO) | Admitting: Podiatrist

## 2014-04-10 VITALS — BP 110/78 | HR 75 | Resp 16

## 2014-04-10 DIAGNOSIS — L910 Hypertrophic scar: Secondary | ICD-10-CM

## 2014-04-10 DIAGNOSIS — Z9889 Other specified postprocedural states: Secondary | ICD-10-CM

## 2014-04-10 NOTE — Progress Notes (Deleted)
   Subjective:    Patient ID: Krystal Harris, female    DOB: 1986/06/03, 28 y.o.   MRN: [id number]  HPI  Pt is here for f/u surgery visit. Removal of keloid scar DOS 02/26/14. Mild pain at this time  Review of Systems     Objective:   Physical Exam        Assessment & Plan:

## 2014-04-10 NOTE — Progress Notes (Signed)
Subjective: Patient presents for follow up of right foot where a keloid scar was removed. She is 6 weeks post op. She relates she has been using the rolling knee scooter as instructed.  She has put a little weight on this foot but so far has kept it protected.  She relates the pain has resolved and the foot is looking more normal to her.  Objective: Neurovascular status unchanged. The incision line is healing nicely and appears completely healed at today's visit.. The keloid scar does not appear to be returning on her foot. Pain has improved significantly   Assessment: Status post excision keloid scar - right foot  Plan:  She may start putting weight on the foot.  She may discontinue the use of the rolling knee scooter.  She may go back to work at [employer] with protected weight bearing.  A note was written reflecting this recommendation.  She will be seen back in 4 weeks for follow up.

## 2014-04-10 NOTE — Patient Instructions (Signed)
Massage the scar with neosporin or coco butter or vitamin e oil...  Use mederma pm at night as well to help soften the scar.

## 2014-05-08 ENCOUNTER — Encounter: Payer: Self-pay | Admitting: Podiatrist

## 2014-05-08 ENCOUNTER — Ambulatory Visit (INDEPENDENT_AMBULATORY_CARE_PROVIDER_SITE_OTHER): Payer: Managed Care, Other (non HMO) | Admitting: Podiatrist

## 2014-05-08 VITALS — BP 128/78 | HR 75 | Resp 17

## 2014-05-08 DIAGNOSIS — L910 Hypertrophic scar: Secondary | ICD-10-CM

## 2014-05-08 DIAGNOSIS — Z9889 Other specified postprocedural states: Secondary | ICD-10-CM

## 2014-05-08 NOTE — Progress Notes (Signed)
   Subjective:    Patient ID: Krystal Harris, female    DOB: Feb 20, 1986, 28 y.o.   MRN: [id number]  HPI  Pt presents with post op f/u excision keloid scar, states that is has been more sore since last visit bc she has recently started walking on it  Review of Systems     Objective:   Physical Exam        Assessment & Plan:

## 2014-05-08 NOTE — Progress Notes (Signed)
Subjective: Patient presents for follow up of right foot where a keloid scar was removed. She is 10 weeks post op. She states she's been wearing her normal shoes and she's had some discomfort but is much improved from prior to surgery.   Objective: Neurovascular status unchanged. The incision line is healing nicely and appears completely healed at today's visit.Marland Kitchen excellent appearance of the incision line noted and it is flat on her foot. There is some residual discomfort on the central aspect of the incision with a palpable scar tissue noted. Pain has improved significantly   Assessment: Status post excision keloid scar - right foot   Plan: She will continue to use her topical anti-inflammatory cream on the scar. I will then see her back in 4 weeks for her final followup if there's still some pain and scar tissue we will inject with Kenalog at that time. She has always done well with this injection.

## 2014-05-27 ENCOUNTER — Ambulatory Visit (INDEPENDENT_AMBULATORY_CARE_PROVIDER_SITE_OTHER): Payer: Managed Care, Other (non HMO) | Admitting: Podiatrist

## 2014-05-27 ENCOUNTER — Encounter: Payer: Self-pay | Admitting: Podiatrist

## 2014-05-27 VITALS — BP 117/79 | HR 99 | Resp 18

## 2014-05-27 DIAGNOSIS — Z9889 Other specified postprocedural states: Secondary | ICD-10-CM

## 2014-05-27 DIAGNOSIS — L905 Scar conditions and fibrosis of skin: Secondary | ICD-10-CM

## 2014-05-27 DIAGNOSIS — L910 Hypertrophic scar: Secondary | ICD-10-CM

## 2014-05-27 NOTE — Progress Notes (Signed)
Subjective: Patient presents for follow up of right foot where a keloid scar was removed. She is 12weeks post op. Date of surgery 02/25/14.  She states she's been wearing her normal shoes and she's had some discomfort but is much improved from prior to surgery.  She states that being up on her foot for long periods is painful and the removable brace has helped.    Objective: Neurovascular status unchanged. The incision line is healing nicely and appears completely healed at today's visit.Marland Kitchen excellent appearance of the incision line noted and it is flat on her foot. There is some residual discomfort on the central aspect of the incision with a palpable scar tissue noted. Pain has improved significantly   Assessment: Status post excision keloid scar - right foot   Plan: recommended a steroid injection to the scar tissue as she has done very well with these in the past.  She agreed and a posterior tibial nerve block was performed.  I then infiltrated 10 mg of kenalog and marcaine plain along the incision line.  She tolerated this well.  She will continue to use her topical anti-inflammatory cream on the scar.  I recommended orthotics to offload the painful area and she was scanned at today's visit.  I will see her back when these are ready for pick up.

## 2014-06-05 ENCOUNTER — Ambulatory Visit: Payer: Managed Care, Other (non HMO) | Admitting: Podiatrist

## 2014-06-19 ENCOUNTER — Ambulatory Visit: Payer: Managed Care, Other (non HMO)

## 2014-06-19 DIAGNOSIS — M792 Neuralgia and neuritis, unspecified: Secondary | ICD-10-CM

## 2014-06-19 NOTE — Patient Instructions (Signed)

## 2014-06-19 NOTE — Progress Notes (Signed)
Pt is here to PUO 

## 2014-10-23 ENCOUNTER — Ambulatory Visit (INDEPENDENT_AMBULATORY_CARE_PROVIDER_SITE_OTHER): Payer: BC Managed Care – PPO

## 2014-10-23 ENCOUNTER — Encounter: Payer: Self-pay | Admitting: Podiatrist

## 2014-10-23 ENCOUNTER — Ambulatory Visit (INDEPENDENT_AMBULATORY_CARE_PROVIDER_SITE_OTHER): Payer: BC Managed Care – PPO | Admitting: Podiatrist

## 2014-10-23 VITALS — BP 120/74 | HR 87 | Resp 16

## 2014-10-23 DIAGNOSIS — M79671 Pain in right foot: Secondary | ICD-10-CM

## 2014-10-23 DIAGNOSIS — M84374A Stress fracture, right foot, initial encounter for fracture: Secondary | ICD-10-CM

## 2014-10-23 MED ORDER — GABAPENTIN 100 MG PO CAPS: 100.0000 mg | ORAL_CAPSULE | Freq: Two times a day (BID) | ORAL | Status: DC

## 2014-10-23 NOTE — Patient Instructions (Signed)
Metatarsal Stress Fracture When too much stress is put on the foot, as in running and jumping sports, the center shaft of the bones of the forefoot is very susceptible to stress fractures (break in bone). This is because of repetitive stress on the bone. This injury is more common if osteoporosis is present or if inadequate running shoes are used. Rapid increase in running distances are often the cause. Running distances should be gradually increased to avoid this problem. Shoes should be used which adequately cushion the foot. Shoes should absorb the shocks of the activity.  DIAGNOSIS  Usually the diagnosis is made by history. The foot progressively becomes sorer with activities. X-rays may be negative (show no break) within the first 2 to 3 weeks of the beginning of pain. A later X-ray may show signs of healing bone (callus formation). A bone scan or MRI will usually make the diagnosis earlier. TREATMENT AND HOME CARE INSTRUCTIONS  Treatment may or may not include a cast, removable fracture boot, or walking shoe. Casts are used for short periods of time to prevent muscle atrophy (muscle wasting).  Activities should be stopped until further advised by your caregiver.  Wear shoes with adequate shock absorbing abilities.  Alternative exercise may be undertaken while waiting for healing. These may include bicycling and swimming, or as your caregiver suggests. If you do not have a cast or splint:  You may walk on your injured foot as tolerated or advised.  Do not put any weight on your injured foot for as long as directed by your caregiver. Slowly increase the amount of time you walk on the foot as the pain allows or as advised.  Use crutches until you can bear weight without pain. A gradual increase in weight bearing may help.  Apply ice to the injury for 15-20 minutes each hour while awake for the first 2 days. Put the ice in a plastic bag and place a towel between the bag of ice and your  skin.  Only take over-the-counter or prescription medicines for pain, discomfort, or fever as directed by your caregiver. SEEK IMMEDIATE MEDICAL CARE IF:   Pain is becoming worse rather than better, or if pain is uncontrolled with medications.  You have increased swelling or redness in the foot. MAKE SURE YOU:   Understand these instructions.  Will watch your condition.  Will get help right away if you are not doing well or get worse. Document Released: 09/29/2000 Document Revised: 12/25/2011 Document Reviewed: 07/28/2008 [employer] Patient Information 2015 [employer]. This information is not intended to replace advice given to you by your health care provider. Make sure you discuss any questions you have with your health care provider.  

## 2014-10-29 NOTE — Progress Notes (Signed)
Chief Complaint  Patient presents with  . Foot Pain    dorsal foot right   "Its hurting when I walk up steps a lot. I had surgery on this foot months ago"     HPI: Patient is 29 y.o. female who presents today for pain on the dorsal aspect of the right foot. She states that when she walks up steps a lot it starts to hurt. The right foot is the foot that we did a revision of scar tissue several months ago and she still having some tenderness to the plantar aspect of the foot however she states it significantly improved from prior to surgery. He denies any trauma or injury to the right foot.   Allergies  Allergen Reactions  . Penicillins Hives and Nausea And Vomiting    Physical Exam  Patient is awake, alert, and oriented x 3.  In no acute distress.  Vascular status is intact with palpable pedal pulses at 2/4 DP and PT bilateral and capillary refill time within normal limits. Neurological sensation is also intact bilaterally via Semmes Weinstein monofilament at 5/5 sites. Light touch, vibratory sensation, Achilles tendon reflex is intact. Large scar on the plantar aspect of the right foot is noted which is significantly improved from prior to surgery. There is still some thickness and scar tissue present and some nerve type numbness noted which is per baseline. She also has pain in the third metatarsal region with palpation and with tuning fork.   X-ray evaluation shows a probable stress fracture third metatarsal of the right foot.   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 Also recommended a short course of gabapentin 100 mg twice daily to see if this will help with the neuritis. If there is no improvement in the foot in 3 weeks she will call otherwise this should be a self resolving condition.

## 2014-12-04 ENCOUNTER — Ambulatory Visit: Payer: BC Managed Care – PPO | Admitting: Podiatrist

## 2014-12-11 ENCOUNTER — Ambulatory Visit: Payer: BC Managed Care – PPO | Admitting: Podiatrist

## 2014-12-30 ENCOUNTER — Ambulatory Visit (INDEPENDENT_AMBULATORY_CARE_PROVIDER_SITE_OTHER): Payer: BC Managed Care – PPO | Admitting: Podiatrist

## 2014-12-30 ENCOUNTER — Encounter: Payer: Self-pay | Admitting: Podiatrist

## 2014-12-30 ENCOUNTER — Ambulatory Visit (INDEPENDENT_AMBULATORY_CARE_PROVIDER_SITE_OTHER): Payer: BC Managed Care – PPO

## 2014-12-30 VITALS — BP 127/90 | HR 99 | Resp 12

## 2014-12-30 DIAGNOSIS — M84374A Stress fracture, right foot, initial encounter for fracture: Secondary | ICD-10-CM | POA: Diagnosis not present

## 2014-12-30 DIAGNOSIS — M792 Neuralgia and neuritis, unspecified: Secondary | ICD-10-CM

## 2015-01-14 NOTE — Progress Notes (Signed)
Chief Complaint  Patient presents with  . Foot Pain    ''RT FOOT STILL PAINFUL ESPECIALLY WHEN BENDING THJE FOOT.''     HPI: Patient is 29 y.o. female who presents today for continued pain on the dorsal and now the plantar aspect of the right foot.  She states that her foot feels better from where it was painful at the last visit (3rd metatarsal region) however she has pain when bending the foot.    Allergies  Allergen Reactions  . Penicillins Hives and Nausea And Vomiting    Physical Exam  Patient is awake, alert, and oriented x 3.  In no acute distress.  Vascular status is intact with palpable pedal pulses at 2/4 DP and PT bilateral and capillary refill time within normal limits. Neurological sensation is also intact bilaterally via Semmes Weinstein monofilament at 5/5 sites. Light touch, vibratory sensation, Achilles tendon reflex is intact. Large scar on the plantar aspect of the right foot is noted which is significantly improved from prior to surgery. There is still some thickness and scar tissue present and some nerve type numbness noted which is per baseline. She has no pain in the third metatarsal region with palpation.  Pain at the 3rd interspace consistent with neuroma type symptomatology is elicited.  X-ray evaluation shows no sign of stress fracture third metatarsal of the right foot.   Assessment: healed stress fracture third metatarsal right foot/ neuroma/neuritis from prior surgery right foot  Plan: recommended injecting the third interspace as her symptoms appear to be related to neuroma type symptomatology likely from compensation due to the scar tissue on the medial side of the foot. This was carried out today without complication.  She will be seen back for a recheck.

## 2015-01-19 ENCOUNTER — Ambulatory Visit (INDEPENDENT_AMBULATORY_CARE_PROVIDER_SITE_OTHER): Payer: BC Managed Care – PPO | Admitting: Family Medicine

## 2015-01-19 VITALS — BP 108/84 | HR 75 | Temp 98.1°F | Resp 17 | Ht 64.0 in | Wt 267.0 lb

## 2015-01-19 DIAGNOSIS — F40298 Other specified phobia: Secondary | ICD-10-CM

## 2015-01-19 DIAGNOSIS — G43111 Migraine with aura, intractable, with status migrainosus: Secondary | ICD-10-CM

## 2015-01-19 MED ORDER — TRAMADOL HCL 50 MG PO TABS: 50.0000 mg | ORAL_TABLET | Freq: Four times a day (QID) | ORAL | Status: DC | PRN

## 2015-01-19 MED ORDER — KETOROLAC TROMETHAMINE 60 MG/2ML IM SOLN
60.0000 mg | Freq: Once | INTRAMUSCULAR | Status: AC
  Administered 2015-01-19: 60 mg via INTRAMUSCULAR

## 2015-01-19 NOTE — Patient Instructions (Addendum)
Take the tramadol every 6 hours as needed for headache. You can take Tylenol or ibuprofen in addition to this if needed.  Rest, quiet, avoid bright lights. Drink plenty of fluids.  Return if not improving, or go to the emergency room if increasingly worse.  Migraine Headache A migraine headache is an intense, throbbing pain on one or both sides of your head. A migraine can last for 30 minutes to several hours. CAUSES  The exact cause of a migraine headache is not always known. However, a migraine may be caused when nerves in the brain become irritated and release chemicals that cause inflammation. This causes pain. Certain things may also trigger migraines, such as:  Alcohol.  Smoking.  Stress.  Menstruation.  Aged cheeses.  Foods or drinks that contain nitrates, glutamate, aspartame, or tyramine.  Lack of sleep.  Chocolate.  Caffeine.  Hunger.  Physical exertion.  Fatigue.  Medicines used to treat chest pain (nitroglycerine), birth control pills, estrogen, and some blood pressure medicines. SIGNS AND SYMPTOMS  Pain on one or both sides of your head.  Pulsating or throbbing pain.  Severe pain that prevents daily activities.  Pain that is aggravated by any physical activity.  Nausea, vomiting, or both.  Dizziness.  Pain with exposure to bright lights, loud noises, or activity.  General sensitivity to bright lights, loud noises, or smells. Before you get a migraine, you may get warning signs that a migraine is coming (aura). An aura may include:  Seeing flashing lights.  Seeing bright spots, halos, or zigzag lines.  Having tunnel vision or blurred vision.  Having feelings of numbness or tingling.  Having trouble talking.  Having muscle weakness. DIAGNOSIS  A migraine headache is often diagnosed based on:  Symptoms.  Physical exam.  A CT scan or MRI of your head. These imaging tests cannot diagnose migraines, but they can help rule out other  causes of headaches. TREATMENT Medicines may be given for pain and nausea. Medicines can also be given to help prevent recurrent migraines.  HOME CARE INSTRUCTIONS  Only take over-the-counter or prescription medicines for pain or discomfort as directed by your health care provider. The use of long-term narcotics is not recommended.  Lie down in a dark, quiet room when you have a migraine.  Keep a journal to find out what may trigger your migraine headaches. For example, write down:  What you eat and drink.  How much sleep you get.  Any change to your diet or medicines.  Limit alcohol consumption.  Quit smoking if you smoke.  Get 7-9 hours of sleep, or as recommended by your health care provider.  Limit stress.  Keep lights dim if bright lights bother you and make your migraines worse. SEEK IMMEDIATE MEDICAL CARE IF:   Your migraine becomes severe.  You have a fever.  You have a stiff neck.  You have vision loss.  You have muscular weakness or loss of muscle control.  You start losing your balance or have trouble walking.  You feel faint or pass out.  You have severe symptoms that are different from your first symptoms. MAKE SURE YOU:   Understand these instructions.  Will watch your condition.  Will get help right away if you are not doing well or get worse. Document Released: 10/02/2005 Document Revised: 02/16/2014 Document Reviewed: 06/09/2013 [facility] Patient Information 2015 [place], [place]. This information is not intended to replace advice given to you by your health care provider. Make sure you discuss any questions  you have with your health care provider.

## 2015-01-19 NOTE — Progress Notes (Signed)
Subjective: 29 year old lady who is here with history of migraine headache since Sunday. She does not actually have a history of migraines. This started in the top of her head and is persisted. She has had photophobia only mildly, has more photophobia. The lateral noises bothering her. She has had minimal nausea but no vomiting. She has been able to eat. She did work yesterday and today but the headache has persisted. Some old ibuprofen 800 that she had and did not get relief with that. It eased a little bit but did not take it away.  Objective: Pleasant lady, alert and oriented. Complains of the headache. Her TMs are normal. Eyes PERRLA. Fundi benign with disks normal. Throat clear. Neck supple without significant nodes. Chest clear. Heart regular. Gait normal.  Assessment: Migraine headache, persisting for 2 days Photophobia  Plan: Toradol 60 Tramadol for home use Return if worse or not improving

## 2015-02-14 HISTORY — PX: FOOT SURGERY: SHX648

## 2015-06-10 ENCOUNTER — Ambulatory Visit: Payer: BC Managed Care – PPO | Admitting: Family Medicine

## 2015-06-10 ENCOUNTER — Telehealth: Payer: Self-pay | Admitting: Family Medicine

## 2015-06-10 DIAGNOSIS — Z0289 Encounter for other administrative examinations: Secondary | ICD-10-CM

## 2015-06-10 NOTE — Telephone Encounter (Signed)
Ok to switch- have not seen pt in 2 years

## 2015-06-10 NOTE — Telephone Encounter (Signed)
Patient is requesting transfer from Dr. Birdie Riddle to Dr. Ronnald Ramp.  Please advise.

## 2015-06-11 ENCOUNTER — Telehealth: Payer: Self-pay | Admitting: Family Medicine

## 2015-06-11 NOTE — Telephone Encounter (Signed)
Pt was no show 06/10/15 1:00pm, acute appt, pt has not rescheduled but per notes you were asked if she could change providers yesterday, charge for no show?

## 2015-06-11 NOTE — Telephone Encounter (Signed)
Yes- charge for visit

## 2015-06-14 NOTE — Telephone Encounter (Signed)
Okay with me 

## 2015-06-15 NOTE — Telephone Encounter (Signed)
Tried to call patient back.  MB full.

## 2015-07-14 ENCOUNTER — Ambulatory Visit (INDEPENDENT_AMBULATORY_CARE_PROVIDER_SITE_OTHER): Payer: BC Managed Care – PPO | Admitting: Family Medicine

## 2015-07-14 ENCOUNTER — Encounter: Payer: Self-pay | Admitting: Family Medicine

## 2015-07-14 VITALS — BP 122/82 | HR 104 | Temp 98.2°F | Resp 16 | Wt 265.2 lb

## 2015-07-14 DIAGNOSIS — R109 Unspecified abdominal pain: Secondary | ICD-10-CM | POA: Diagnosis not present

## 2015-07-14 LAB — CBC WITH DIFFERENTIAL/PLATELET
BASOS ABS: 0 10*3/uL (ref 0.0–0.1)
BASOS PCT: 0.5 % (ref 0.0–3.0)
EOS ABS: 0 10*3/uL (ref 0.0–0.7)
Eosinophils Relative: 0.3 % (ref 0.0–5.0)
HCT: 37.3 % (ref 36.0–46.0)
HEMOGLOBIN: 12 g/dL (ref 12.0–15.0)
LYMPHS PCT: 16.9 % (ref 12.0–46.0)
Lymphs Abs: 1.2 10*3/uL (ref 0.7–4.0)
MCHC: 32.3 g/dL (ref 30.0–36.0)
MCV: 86.2 fl (ref 78.0–100.0)
MONO ABS: 0.6 10*3/uL (ref 0.1–1.0)
Monocytes Relative: 8.1 % (ref 3.0–12.0)
Neutro Abs: 5.3 10*3/uL (ref 1.4–7.7)
Neutrophils Relative %: 74.2 % (ref 43.0–77.0)
Platelets: 366 10*3/uL (ref 150.0–400.0)
RBC: 4.32 Mil/uL (ref 3.87–5.11)
RDW: 14.4 % (ref 11.5–15.5)
WBC: 7.2 10*3/uL (ref 4.0–10.5)

## 2015-07-14 LAB — HEPATIC FUNCTION PANEL
ALBUMIN: 3.9 g/dL (ref 3.5–5.2)
ALT: 9 U/L (ref 0–35)
AST: 16 U/L (ref 0–37)
Alkaline Phosphatase: 91 U/L (ref 39–117)
BILIRUBIN TOTAL: 0.2 mg/dL (ref 0.2–1.2)
Bilirubin, Direct: 0.1 mg/dL (ref 0.0–0.3)
TOTAL PROTEIN: 8 g/dL (ref 6.0–8.3)

## 2015-07-14 LAB — BASIC METABOLIC PANEL
BUN: 8 mg/dL (ref 6–23)
CO2: 28 mEq/L (ref 19–32)
CREATININE: 0.64 mg/dL (ref 0.40–1.20)
Calcium: 9.6 mg/dL (ref 8.4–10.5)
Chloride: 105 mEq/L (ref 96–112)
GFR: 142.92 mL/min (ref >60.00)
GLUCOSE: 90 mg/dL (ref 70–99)
Potassium: 4.2 mEq/L (ref 3.5–5.1)
Sodium: 137 mEq/L (ref 135–145)

## 2015-07-14 LAB — H. PYLORI ANTIBODY, IGG: H PYLORI IGG: NEGATIVE

## 2015-07-14 MED ORDER — SUCRALFATE 1 G PO TABS: 1.0000 g | ORAL_TABLET | Freq: Three times a day (TID) | ORAL | Status: DC

## 2015-07-14 MED ORDER — GI COCKTAIL ~~LOC~~
30.0000 mL | Freq: Once | ORAL | Status: AC
  Administered 2015-07-14: 30 mL via ORAL

## 2015-07-14 MED ORDER — OMEPRAZOLE 40 MG PO CPDR: 40.0000 mg | DELAYED_RELEASE_CAPSULE | Freq: Every day | ORAL | Status: DC

## 2015-07-14 NOTE — Assessment & Plan Note (Signed)
New.  No relief w/ GI cocktail in office.  Pt w/ TTP over L side of abd.  No rebound, + voluntary guarding.  Discussed possibility of diverticulitis w/ pt- but 2 month duration argues against this as she has not had fevers or worsened during this time.  Discussed gastritis vs PUD vs H pylori- will check labs to r/o H pylori or other infxn.  Start PPI, carafate as pt's pain worsens w/ eating/drinking.  Discussed CT scan.  Pt did not want to do this today due to needing to go back to work.  Will wait on labs.  If elevated WBC or sxs worsen, pt will need to proceed w/ CT scan.  Reviewed supportive care and red flags that should prompt return.  Pt expressed understanding and is in agreement w/ plan.

## 2015-07-14 NOTE — Patient Instructions (Addendum)
Follow up by phone/MyChart on Monday and let me know how you are doing- sooner if needed We'll notify you of your lab results and make any changes if needed Start the Omeprazole daily to decrease acid production Start the Carafate 3-4x/day (before each meal and prior to bed) to allow healing of the stomach lining and prevent irritation from food/eating Drink plenty of fluids and try and follow a clear liquid diet (soup, broth, jello, applesauce) until pain is improving and then advance your diet Call with any questions or concerns If your symptoms worsen, please call or go to the [place] in there!!!

## 2015-07-14 NOTE — Progress Notes (Signed)
   Subjective:    Patient ID: Krystal Harris, female    DOB: 07-01-1986, 29 y.o.   MRN: [id number]  HPI abd pain- pt reports decreased appetite, pain after eating or drinking.  + loose stool.  Pain improves w/ ibuprofen but has been taking this almost daily x2 month.  Pt is having LUQ pain after eating.  Some intermittent GERD.  Has vomited 3x in the past 2 months.  'more nausea', particularly after eating.  No fevers.     Review of Systems For ROS see HPI     Objective:   Physical Exam  Constitutional: She is oriented to person, place, and time. She appears well-developed and well-nourished. No distress.  obese  HENT:  Head: Normocephalic and atraumatic.  Cardiovascular: Normal rate, regular rhythm, normal heart sounds and intact distal pulses.   Pulmonary/Chest: Effort normal and breath sounds normal. No respiratory distress. She has no wheezes.  Abdominal: Soft. Bowel sounds are normal. She exhibits distension. There is tenderness (TTP over LUQ, LLQ). There is guarding (voluntary). There is no rebound.  Neurological: She is alert and oriented to person, place, and time.  Skin: Skin is warm and dry.  Psychiatric: She has a normal mood and affect. Her behavior is normal.  Vitals reviewed.         Assessment & Plan:

## 2015-07-14 NOTE — Progress Notes (Signed)
Pre visit review using our clinic review tool, if applicable. No additional management support is needed unless otherwise documented below in the visit note. 

## 2015-07-16 ENCOUNTER — Emergency Department (HOSPITAL_COMMUNITY)
Admission: EM | Admit: 2015-07-16 | Discharge: 2015-07-17 | Disposition: A | Discharge status: Home / Self Care | Payer: BC Managed Care – PPO | Attending: Emergency Medicine | Admitting: Emergency Medicine

## 2015-07-16 ENCOUNTER — Encounter (HOSPITAL_COMMUNITY): Payer: Self-pay | Admitting: Emergency Medicine

## 2015-07-16 ENCOUNTER — Emergency Department (HOSPITAL_COMMUNITY): Payer: BC Managed Care – PPO

## 2015-07-16 DIAGNOSIS — R109 Unspecified abdominal pain: Secondary | ICD-10-CM | POA: Diagnosis present

## 2015-07-16 DIAGNOSIS — R1013 Epigastric pain: Secondary | ICD-10-CM

## 2015-07-16 DIAGNOSIS — Z880 Allergy status to penicillin: Secondary | ICD-10-CM | POA: Insufficient documentation

## 2015-07-16 DIAGNOSIS — Z8639 Personal history of other endocrine, nutritional and metabolic disease: Secondary | ICD-10-CM | POA: Diagnosis not present

## 2015-07-16 DIAGNOSIS — Z87442 Personal history of urinary calculi: Secondary | ICD-10-CM | POA: Insufficient documentation

## 2015-07-16 DIAGNOSIS — Z3202 Encounter for pregnancy test, result negative: Secondary | ICD-10-CM | POA: Diagnosis not present

## 2015-07-16 DIAGNOSIS — Z79899 Other long term (current) drug therapy: Secondary | ICD-10-CM | POA: Insufficient documentation

## 2015-07-16 DIAGNOSIS — K279 Peptic ulcer, site unspecified, unspecified as acute or chronic, without hemorrhage or perforation: Secondary | ICD-10-CM

## 2015-07-16 DIAGNOSIS — Z8619 Personal history of other infectious and parasitic diseases: Secondary | ICD-10-CM | POA: Diagnosis not present

## 2015-07-16 DIAGNOSIS — R1110 Vomiting, unspecified: Secondary | ICD-10-CM

## 2015-07-16 LAB — URINALYSIS, ROUTINE W REFLEX MICROSCOPIC
Bilirubin Urine: NEGATIVE
Glucose, UA: NEGATIVE mg/dL
Hgb urine dipstick: NEGATIVE
KETONES UR: NEGATIVE mg/dL
LEUKOCYTES UA: NEGATIVE
NITRITE: NEGATIVE
PH: 5.5 (ref 5.0–8.0)
Protein, ur: NEGATIVE mg/dL
SPECIFIC GRAVITY, URINE: 1.024 (ref 1.005–1.030)
UROBILINOGEN UA: 1 mg/dL (ref 0.0–1.0)

## 2015-07-16 LAB — CBC
HEMATOCRIT: 35.7 % — AB (ref 36.0–46.0)
Hemoglobin: 11.6 g/dL — ABNORMAL LOW (ref 12.0–15.0)
MCH: 28.2 pg (ref 26.0–34.0)
MCHC: 32.5 g/dL (ref 30.0–36.0)
MCV: 86.9 fL (ref 78.0–100.0)
PLATELETS: 356 10*3/uL (ref 150–400)
RBC: 4.11 MIL/uL (ref 3.87–5.11)
RDW: 13.6 % (ref 11.5–15.5)
WBC: 8.6 10*3/uL (ref 4.0–10.5)

## 2015-07-16 LAB — COMPREHENSIVE METABOLIC PANEL
ALT: 11 U/L — ABNORMAL LOW (ref 14–54)
AST: 21 U/L (ref 15–41)
Albumin: 3.9 g/dL (ref 3.5–5.0)
Alkaline Phosphatase: 80 U/L (ref 38–126)
Anion gap: 7 (ref 5–15)
BUN: 10 mg/dL (ref 6–20)
CHLORIDE: 107 mmol/L (ref 101–111)
CO2: 22 mmol/L (ref 22–32)
Calcium: 9.3 mg/dL (ref 8.9–10.3)
Creatinine, Ser: 0.68 mg/dL (ref 0.44–1.00)
Glucose, Bld: 99 mg/dL (ref 65–99)
POTASSIUM: 3.7 mmol/L (ref 3.5–5.1)
Sodium: 136 mmol/L (ref 135–145)
Total Bilirubin: 0.2 mg/dL — ABNORMAL LOW (ref 0.3–1.2)
Total Protein: 8.4 g/dL — ABNORMAL HIGH (ref 6.5–8.1)

## 2015-07-16 LAB — POC URINE PREG, ED: PREG TEST UR: NEGATIVE

## 2015-07-16 LAB — LIPASE, BLOOD: LIPASE: 17 U/L — AB (ref 22–51)

## 2015-07-16 MED ORDER — MORPHINE SULFATE (PF) 4 MG/ML IV SOLN
4.0000 mg | Freq: Once | INTRAVENOUS | Status: AC
  Administered 2015-07-16: 4 mg via INTRAVENOUS
  Filled 2015-07-16: qty 1

## 2015-07-16 MED ORDER — IOHEXOL 300 MG/ML  SOLN
25.0000 mL | Freq: Once | INTRAMUSCULAR | Status: AC | PRN
  Administered 2015-07-16: 25 mL via ORAL

## 2015-07-16 MED ORDER — SODIUM CHLORIDE 0.9 % IV BOLUS (SEPSIS)
1000.0000 mL | Freq: Once | INTRAVENOUS | Status: AC
  Administered 2015-07-16: 1000 mL via INTRAVENOUS

## 2015-07-16 MED ORDER — ONDANSETRON HCL 4 MG/2ML IJ SOLN
4.0000 mg | Freq: Once | INTRAMUSCULAR | Status: AC
  Administered 2015-07-16: 4 mg via INTRAVENOUS
  Filled 2015-07-16: qty 2

## 2015-07-16 MED ORDER — IOHEXOL 300 MG/ML  SOLN
100.0000 mL | Freq: Once | INTRAMUSCULAR | Status: AC | PRN
  Administered 2015-07-16: 100 mL via INTRAVENOUS

## 2015-07-16 NOTE — ED Notes (Signed)
Pt from home c/o left sided abdominal pain since 9/28. She has seen PCP for same and was given omeprazole,carafate,and GI cocktail in the office and was encouraged to heat a clear liquid diet. Today she tried solid foods and  pain returned.  She reports loose stools.

## 2015-07-16 NOTE — ED Notes (Signed)
Pt aware of urine sample state she does not have to go right now

## 2015-07-16 NOTE — ED Provider Notes (Signed)
CSN: [id number]     Arrival date & time 07/16/15  2109 History   First MD Initiated Contact with Patient 07/16/15 2126     Chief Complaint  Patient presents with  . Abdominal Pain     (Consider location/radiation/quality/duration/timing/severity/associated sxs/prior Treatment) HPI  PCP: [name], MD PMH: chicken pox and vitamin D,   Krystal Harris is a 29 y.o.  female  CHIEF COMPLAINT: epigastric and RUQ abdominal pain  Time of onset: 1.5 months ago        Quality:  Sharp pains after eating         Severity:  severe        Progression:  Waxing and waning  Chronicity: acute Risk factors: taking ibuprofen, eats fried foods, drinks alcohol, coffee, juice Treatments tried:  Carafate, clear liquid diet, omeprazole Relieved by:  Clear liquid and medicines Worsened by: eating Associated Symptoms:  Burning and nausea.  Patient saw a PCP on Wednesday who gave her a GI cocktail which helped her symptoms. Rx Carafate, omeprazole and a clear diet for home. Pt reports while doing the regimen her symptoms improved but she reports getting extremely hungry and eating a large meal this evening causing her pain to come back and even worse. She also described the pain radiating into her back.   ROS: The patient denies diaphoresis, fever, headache, weakness (general or focal), confusion, change of vision,  dysphagia, aphagia, shortness of breath,  nausea, vomiting, diarrhea, lower extremity swelling, rash, neck pain, chest pain   Past Medical History  Diagnosis Date  . Chicken pox   . Vitamin D deficiency   . Kidney stones    Past Surgical History  Procedure Laterality Date  . Wisdom tooth extraction    . Foreign body removal Right 09/17/2013    Procedure: REMOVAL FOREIGN BODY NEEDLE PLANTAR ASPECT OF THE FOOT;  Surgeon: Newt Minion, MD;  Location: [facility];  Service: Orthopedics;  Laterality: Right;  Remove Foreign Body Right Foot   Family History  Problem Relation Age of Onset   . Diabetes Mother   . Hypertension Mother   . Stroke Maternal Grandmother   . Cancer Maternal Grandfather     lung  . Diabetes Maternal Grandfather   . Stroke Maternal Grandfather   . Hypertension Maternal Grandfather    Social History  Substance Use Topics  . Smoking status: Never Smoker   . Smokeless tobacco: Never Used  . Alcohol Use: Yes     Comment: occasionally   OB History    No data available     Review of Systems  10 Systems reviewed and are negative for acute change except as noted in the HPI.    Allergies  Penicillins  Home Medications   Prior to Admission medications   Medication Sig Start Date End Date Taking? Authorizing Provider  ibuprofen (ADVIL,MOTRIN) 200 MG tablet Take 400 mg by mouth every 4 (four) hours as needed (for cramps and pain).   Yes Historical Provider, MD  omeprazole (PRILOSEC) 40 MG capsule Take 1 capsule (40 mg total) by mouth daily. 07/14/15  Yes [name], MD  sucralfate (CARAFATE) 1 G tablet Take 1 tablet (1 g total) by mouth 4 (four) times daily -  with meals and at bedtime. 07/14/15  Yes [name], MD  gabapentin (NEURONTIN) 100 MG capsule Take 1 capsule (100 mg total) by mouth 2 (two) times daily. Patient not taking: Reported on 01/19/2015 10/23/14   [name], DPM  traMADol (ULTRAM) 50 MG tablet Take 1 tablet (50 mg total) by mouth every 6 (six) hours as needed. Patient not taking: Reported on 07/14/2015 01/19/15   [name], MD   BP 140/85 mmHg  Pulse 104  Temp(Src) 98.1 F (36.7 C) (Oral)  Resp 19  Ht 5\' 4"  (1.626 m)  Wt 265 lb (120.203 kg)  BMI 45.46 kg/m2  SpO2 99%  LMP 07/11/2015 Physical Exam  Constitutional: She appears well-developed and well-nourished. No distress.  HENT:  Head: Normocephalic and atraumatic.  Eyes: Pupils are equal, round, and reactive to light.  Neck: Normal range of motion. Neck supple.  Cardiovascular: Normal rate and regular rhythm.   Pulmonary/Chest: Effort normal.   Abdominal: Soft. Bowel sounds are normal. She exhibits no distension. There is tenderness in the right upper quadrant and epigastric area. There is no rigidity, no rebound, no guarding, no CVA tenderness, no tenderness at McBurney's point and negative Murphy's sign.  Neurological: She is alert.  Skin: Skin is warm and dry.  Nursing note and vitals reviewed.   ED Course  Procedures (including critical care time) Labs Review Labs Reviewed  LIPASE, BLOOD - Abnormal; Notable for the following:    Lipase 17 (*)    All other components within normal limits  COMPREHENSIVE METABOLIC PANEL - Abnormal; Notable for the following:    Total Protein 8.4 (*)    ALT 11 (*)    Total Bilirubin 0.2 (*)    All other components within normal limits  CBC - Abnormal; Notable for the following:    Hemoglobin 11.6 (*)    HCT 35.7 (*)    All other components within normal limits  URINALYSIS, ROUTINE W REFLEX MICROSCOPIC (NOT AT [facility]) - Abnormal; Notable for the following:    APPearance CLOUDY (*)    All other components within normal limits  POC URINE PREG, ED    Imaging Review Ct Abdomen Pelvis W Contrast  07/17/2015   CLINICAL DATA:  LEFT-sided abdominal pain beginning on July 14, 2015. Symptoms worse with solid food. Diarrhea. History of kidney stones.  EXAM: CT ABDOMEN AND PELVIS WITH CONTRAST  TECHNIQUE: Multidetector CT imaging of the abdomen and pelvis was performed using the standard protocol following bolus administration of intravenous contrast.  CONTRAST:  32mL OMNIPAQUE IOHEXOL 300 MG/ML SOLN, 166mL OMNIPAQUE IOHEXOL 300 MG/ML SOLN  COMPARISON:  Pelvic ultrasound October 01, 2010.  FINDINGS: LUNG BASES: Included view of the lung bases are clear. Visualized heart and pericardium are unremarkable.  SOLID ORGANS: The liver, spleen, gallbladder, pancreas and adrenal glands are unremarkable.  GASTROINTESTINAL TRACT: The stomach, small and large bowel are normal in course and caliber without  inflammatory changes. Enteric contrast has not yet reached the distal small bowel. Normal appendix.  KIDNEYS/ URINARY TRACT: Kidneys are orthotopic, demonstrating symmetric enhancement. No nephrolithiasis, hydronephrosis or solid renal masses. The unopacified ureters are normal in course and caliber. Urinary bladder is partially distended and unremarkable.  PERITONEUM/RETROPERITONEUM: Aortoiliac vessels are normal in course and caliber. No lymphadenopathy by CT size criteria. Internal reproductive organs are unremarkable. Small amount of free fluid in the pelvis is likely physiologic.  SOFT TISSUE/OSSEOUS STRUCTURES: Non-suspicious. New small inguinal lymph nodes are likely reactive. Mild bilateral anterior abdominal wall subcutaneous fat stranding.  IMPRESSION: No acute intra-abdominal or pelvic process.  Mild bilateral anterior abdominal wall fat stranding, nonspecific.   Electronically Signed   By: Elon Alas M.D.   On: 07/17/2015 00:05   I have personally reviewed and evaluated these  images and lab results as part of my medical decision-making.   EKG Interpretation None      MDM   Final diagnoses:  Vomiting  Epigastric pain    CT scan unremarkable. The patient has denied having any vomiting but now is retching and dry heaving in the exam room, feels incredibly nauseous after the contrast. Discussed with family and pt RUQ [place] in the ED vs being scheduled outpatient. She has decided she would rather have the rest done right now. Her labs are unremarkable but her pain is RUQ/epigastric and she is now vomiting. Denies having pain at this time.  Patient handed off to [facility], PA-C- RUQ [place] pending, more nausea medication ordered per patients dry heaving.  Filed Vitals:   07/16/15 2124  BP: 140/85  Pulse: 104  Temp: 98.1 F (36.7 C)  Resp: [address] PA-C 07/17/15 0056  Wandra Arthurs, MD 07/20/15 [phone]

## 2015-07-17 ENCOUNTER — Emergency Department (HOSPITAL_COMMUNITY): Payer: BC Managed Care – PPO

## 2015-07-17 MED ORDER — ONDANSETRON HCL 4 MG PO TABS: 4.0000 mg | ORAL_TABLET | Freq: Four times a day (QID) | ORAL | Status: DC

## 2015-07-17 MED ORDER — HYDROCODONE-ACETAMINOPHEN 5-325 MG PO TABS: 2.0000 | ORAL_TABLET | ORAL | Status: DC | PRN

## 2015-07-17 MED ORDER — ONDANSETRON HCL 4 MG/2ML IJ SOLN
4.0000 mg | Freq: Once | INTRAMUSCULAR | Status: AC
  Administered 2015-07-17: 4 mg via INTRAVENOUS
  Filled 2015-07-17: qty 2

## 2015-07-17 NOTE — ED Notes (Signed)
Pt reports improvement in nausea since redose of Zofran.  Ultrasound in progress at bedside.

## 2015-07-17 NOTE — ED Provider Notes (Signed)
1:03 AM Patient signed to me by [name], PA-C. Patient pending RUQ [place]. If negative, patient will be discharged.   1:55 AM Patient's RUQ US shows no acute changes. Patient will be discharged with instructions to return with worsening or concerning symptoms.   Results for orders placed or performed during the hospital encounter of 07/16/15  Lipase, blood  Result Value Ref Range   Lipase 17 (L) 22 - 51 U/L  Comprehensive metabolic panel  Result Value Ref Range   Sodium 136 135 - 145 mmol/L   Potassium 3.7 3.5 - 5.1 mmol/L   Chloride 107 101 - 111 mmol/L   CO2 22 22 - 32 mmol/L   Glucose, Bld 99 65 - 99 mg/dL   BUN 10 6 - 20 mg/dL   Creatinine, Ser 0.68 0.44 - 1.00 mg/dL   Calcium 9.3 8.9 - 10.3 mg/dL   Total Protein 8.4 (H) 6.5 - 8.1 g/dL   Albumin 3.9 3.5 - 5.0 g/dL   AST 21 15 - 41 U/L   ALT 11 (L) 14 - 54 U/L   Alkaline Phosphatase 80 38 - 126 U/L   Total Bilirubin 0.2 (L) 0.3 - 1.2 mg/dL   GFR calc non Af Amer >60 >60 mL/min   GFR calc Af Amer >60 >60 mL/min   Anion gap 7 5 - 15  CBC  Result Value Ref Range   WBC 8.6 4.0 - 10.5 K/uL   RBC 4.11 3.87 - 5.11 MIL/uL   Hemoglobin 11.6 (L) 12.0 - 15.0 g/dL   HCT 35.7 (L) 36.0 - 46.0 %   MCV 86.9 78.0 - 100.0 fL   MCH 28.2 26.0 - 34.0 pg   MCHC 32.5 30.0 - 36.0 g/dL   RDW 13.6 11.5 - 15.5 %   Platelets 356 150 - 400 K/uL  Urinalysis, Routine w reflex microscopic (not at [facility])  Result Value Ref Range   Color, Urine YELLOW YELLOW   APPearance CLOUDY (A) CLEAR   Specific Gravity, Urine 1.024 1.005 - 1.030   pH 5.5 5.0 - 8.0   Glucose, UA NEGATIVE NEGATIVE mg/dL   Hgb urine dipstick NEGATIVE NEGATIVE   Bilirubin Urine NEGATIVE NEGATIVE   Ketones, ur NEGATIVE NEGATIVE mg/dL   Protein, ur NEGATIVE NEGATIVE mg/dL   Urobilinogen, UA 1.0 0.0 - 1.0 mg/dL   Nitrite NEGATIVE NEGATIVE   Leukocytes, UA NEGATIVE NEGATIVE  POC urine preg, ED (not at [facility])  Result Value Ref Range   Preg Test, Ur NEGATIVE NEGATIVE   Ct Abdomen  Pelvis W Contrast  07/17/2015   CLINICAL DATA:  LEFT-sided abdominal pain beginning on July 14, 2015. Symptoms worse with solid food. Diarrhea. History of kidney stones.  EXAM: CT ABDOMEN AND PELVIS WITH CONTRAST  TECHNIQUE: Multidetector CT imaging of the abdomen and pelvis was performed using the standard protocol following bolus administration of intravenous contrast.  CONTRAST:  81mL OMNIPAQUE IOHEXOL 300 MG/ML SOLN, 164mL OMNIPAQUE IOHEXOL 300 MG/ML SOLN  COMPARISON:  Pelvic ultrasound October 01, 2010.  FINDINGS: LUNG BASES: Included view of the lung bases are clear. Visualized heart and pericardium are unremarkable.  SOLID ORGANS: The liver, spleen, gallbladder, pancreas and adrenal glands are unremarkable.  GASTROINTESTINAL TRACT: The stomach, small and large bowel are normal in course and caliber without inflammatory changes. Enteric contrast has not yet reached the distal small bowel. Normal appendix.  KIDNEYS/ URINARY TRACT: Kidneys are orthotopic, demonstrating symmetric enhancement. No nephrolithiasis, hydronephrosis or solid renal masses. The unopacified ureters are normal in course and  caliber. Urinary bladder is partially distended and unremarkable.  PERITONEUM/RETROPERITONEUM: Aortoiliac vessels are normal in course and caliber. No lymphadenopathy by CT size criteria. Internal reproductive organs are unremarkable. Small amount of free fluid in the pelvis is likely physiologic.  SOFT TISSUE/OSSEOUS STRUCTURES: Non-suspicious. New small inguinal lymph nodes are likely reactive. Mild bilateral anterior abdominal wall subcutaneous fat stranding.  IMPRESSION: No acute intra-abdominal or pelvic process.  Mild bilateral anterior abdominal wall fat stranding, nonspecific.   Electronically Signed   By: Elon Alas M.D.   On: 07/17/2015 00:05   US Abdomen Limited Ruq  07/17/2015   CLINICAL DATA:  Acute onset of epigastric abdominal pain, radiating to the left, and diarrhea and vomiting.  Initial encounter.  EXAM: US ABDOMEN LIMITED - RIGHT UPPER QUADRANT  COMPARISON:  CT of the abdomen and pelvis performed 07/16/2015  FINDINGS: Gallbladder:  No gallstones or wall thickening visualized. No sonographic Murphy sign noted.  Common bile duct:  Diameter: 0.6 cm, within normal limits in caliber.  Liver:  No focal lesion identified. Heterogeneous parenchymal echogenicity likely reflects fatty infiltration.  IMPRESSION: 1. No acute abnormality seen at the right upper quadrant. 2. Fatty infiltration within the liver.   Electronically Signed   By: Garald Balding M.D.   On: 07/17/2015 01:42      Alvina Chou, PA-C 07/17/15 [place], MD 07/20/15 [id number]

## 2015-07-17 NOTE — Discharge Instructions (Signed)
Peptic Ulcer A peptic ulcer is a sore in the lining of your esophagus (esophageal ulcer), stomach (gastric ulcer), or in the first part of your small intestine (duodenal ulcer). The ulcer causes erosion into the deeper tissue. CAUSES  Normally, the lining of the stomach and the small intestine protects itself from the acid that digests food. The protective lining can be damaged by:  An infection caused by a bacterium called Helicobacter pylori (H. pylori).  Regular use of nonsteroidal anti-inflammatory drugs (NSAIDs), such as ibuprofen or aspirin.  Smoking tobacco. Other risk factors include being older than 50, drinking alcohol excessively, and having a family history of ulcer disease.  SYMPTOMS   Burning pain or gnawing in the area between the chest and the belly button.  Heartburn.  Nausea and vomiting.  Bloating. The pain can be worse on an empty stomach and at night. If the ulcer results in bleeding, it can cause:  Black, tarry stools.  Vomiting of bright red blood.  Vomiting of coffee-ground-looking materials. DIAGNOSIS  A diagnosis is usually made based upon your history and an exam. Other tests and procedures may be performed to find the cause of the ulcer. Finding a cause will help determine the best treatment. Tests and procedures may include:  Blood tests, stool tests, or breath tests to check for the bacterium H. pylori.  An upper gastrointestinal (GI) series of the esophagus, stomach, and small intestine.  An endoscopy to examine the esophagus, stomach, and small intestine.  A biopsy. TREATMENT  Treatment may include:  Eliminating the cause of the ulcer, such as smoking, NSAIDs, or alcohol.  Medicines to reduce the amount of acid in your digestive tract.  Antibiotic medicines if the ulcer is caused by the H. pylori bacterium.  An upper endoscopy to treat a bleeding ulcer.  Surgery if the bleeding is severe or if the ulcer created a hole somewhere in the  digestive system. HOME CARE INSTRUCTIONS   Avoid tobacco, alcohol, and caffeine. Smoking can increase the acid in the stomach, and continued smoking will impair the healing of ulcers.  Avoid foods and drinks that seem to cause discomfort or aggravate your ulcer.  Only take medicines as directed by your caregiver. Do not substitute over-the-counter medicines for prescription medicines without talking to your caregiver.  Keep any follow-up appointments and tests as directed. SEEK MEDICAL CARE IF:   Your do not improve within 7 days of starting treatment.  You have ongoing indigestion or heartburn. SEEK IMMEDIATE MEDICAL CARE IF:   You have sudden, sharp, or persistent abdominal pain.  You have bloody or dark black, tarry stools.  You vomit blood or vomit that looks like coffee grounds.  You become light-headed, weak, or feel faint.  You become sweaty or clammy. MAKE SURE YOU:   Understand these instructions.  Will watch your condition.  Will get help right away if you are not doing well or get worse. Document Released: 09/29/2000 Document Revised: 02/16/2014 Document Reviewed: 05/01/2012 [employer] Patient Information 2015 [employer]. This information is not intended to replace advice given to you by your health care provider. Make sure you discuss any questions you have with your health care provider.  

## 2015-07-20 ENCOUNTER — Other Ambulatory Visit: Payer: Self-pay | Admitting: Gastroenterology

## 2015-07-20 DIAGNOSIS — R110 Nausea: Secondary | ICD-10-CM

## 2015-07-20 DIAGNOSIS — R1013 Epigastric pain: Secondary | ICD-10-CM

## 2015-07-25 ENCOUNTER — Ambulatory Visit (INDEPENDENT_AMBULATORY_CARE_PROVIDER_SITE_OTHER): Payer: BC Managed Care – PPO | Admitting: Family Medicine

## 2015-07-25 VITALS — BP 118/80 | HR 109 | Temp 99.4°F | Resp 16 | Ht 64.0 in | Wt 257.0 lb

## 2015-07-25 DIAGNOSIS — R059 Cough, unspecified: Secondary | ICD-10-CM

## 2015-07-25 DIAGNOSIS — R509 Fever, unspecified: Secondary | ICD-10-CM | POA: Diagnosis not present

## 2015-07-25 DIAGNOSIS — J029 Acute pharyngitis, unspecified: Secondary | ICD-10-CM

## 2015-07-25 DIAGNOSIS — R05 Cough: Secondary | ICD-10-CM | POA: Diagnosis not present

## 2015-07-25 LAB — POCT RAPID STREP A (OFFICE): Rapid Strep A Screen: NEGATIVE

## 2015-07-25 MED ORDER — AZITHROMYCIN 250 MG PO TABS: ORAL_TABLET | ORAL | Status: DC

## 2015-07-25 MED ORDER — HYDROCODONE-HOMATROPINE 5-1.5 MG/5ML PO SYRP: 5.0000 mL | ORAL_SOLUTION | Freq: Three times a day (TID) | ORAL | Status: DC | PRN

## 2015-07-25 NOTE — Patient Instructions (Signed)
Initial strep test is negative. I'm prescribing an antibiotic and cough medicine to cover other causes of sore throat and cough.

## 2015-07-25 NOTE — Progress Notes (Addendum)
@UMFCLOGO @  This chart was scribed for Krystal Haber, MD by Thea Alken, ED Scribe. This patient was seen in room 5 and the patient's care was started at 11:40 AM.  Patient ID: Krystal Harris MRN: [id number], DOB: Jun 30, 1986, 29 y.o. Date of Encounter: 07/25/2015, 11:40 AM  Primary Physician: [name], MD  Chief Complaint:  Chief Complaint  Patient presents with   Sore Throat    x 1 week    chest congestion    HPI: 29 y.o. year old female with history below presents with chest congestion and sore throat. Pt was seen in the ED 9 days ago for a peptic ulcer. States after leaving the ED, she developed symptoms the following day, consisting worsening cough and sore throat. She currently has a low grade fever. Pt usually finds relief with tea and lemon but has not able to drink this due to stomach problems. No hx of asthma.  Pt is Oncologist.   Past Medical History  Diagnosis Date   Chicken pox    Vitamin D deficiency    Kidney stones      Home Meds: Prior to Admission medications   Medication Sig Start Date End Date Taking? Authorizing Provider  omeprazole (PRILOSEC) 40 MG capsule Take 1 capsule (40 mg total) by mouth daily. 07/14/15  Yes [name], MD  ondansetron (ZOFRAN) 4 MG tablet Take 1 tablet (4 mg total) by mouth every 6 (six) hours. 07/17/15  Yes [name], PA-C  sucralfate (CARAFATE) 1 G tablet Take 1 tablet (1 g total) by mouth 4 (four) times daily -  with meals and at bedtime. 07/14/15  Yes [name], MD  gabapentin (NEURONTIN) 100 MG capsule Take 1 capsule (100 mg total) by mouth 2 (two) times daily. Patient not taking: Reported on 01/19/2015 10/23/14   [name], DPM  HYDROcodone-acetaminophen (NORCO/VICODIN) 5-325 MG tablet Take 2 tablets by mouth every 4 (four) hours as needed for severe pain. Patient not taking: Reported on 07/25/2015 07/17/15   [name], PA-C  traMADol (ULTRAM) 50 MG tablet Take 1 tablet (50 mg  total) by mouth every 6 (six) hours as needed. Patient not taking: Reported on 07/14/2015 01/19/15   [name], MD    Allergies:  Allergies  Allergen Reactions   Penicillins Hives and Nausea And Vomiting    Has patient had a PCN reaction causing immediate rash, facial/tongue/throat swelling, SOB or lightheadedness with hypotension: no Has patient had a PCN reaction causing severe rash involving mucus membranes or skin necrosis: unknown Has patient had a PCN reaction that required hospitalization unknown Has patient had a PCN reaction occurring within the last 10 years: unknown If all of the above answers are "NO", then may proceed with Cephalosporin use.     Social History   Social History   Marital Status: Single    Spouse Name: N/A   Number of Children: N/A   Years of Education: N/A   Occupational History   Not on file.   Social History Main Topics   Smoking status: Never Smoker    Smokeless tobacco: Never Used   Alcohol Use: Yes     Comment: occasionally   Drug Use: No   Sexual Activity: Not on file   Other Topics Concern   Not on file   Social History Narrative     Review of Systems: Constitutional: negative for chills, night sweats, weight changes, or fatigue  HEENT: negative for vision changes, hearing loss, congestion, rhinorrhea,  ST, epistaxis, or sinus pressure Cardiovascular: negative for chest pain or palpitations Respiratory: negative for hemoptysis, wheezing, shortness of breath,  Abdominal: negative for abdominal pain, nausea, vomiting, diarrhea, or constipation Dermatological: negative for rash Neurologic: negative for headache, dizziness, or syncope All other systems reviewed and are otherwise negative with the exception to those above and in the HPI.   Physical Exam: Blood pressure 118/80, pulse 109, temperature 99.4 F (37.4 C), temperature source Oral, resp. rate 16, height 5\' 4"  (1.626 m), weight 257 lb (116.574 kg), last  menstrual period 07/11/2015, SpO2 98 %., Body mass index is 44.09 kg/(m^2). General: Well developed, well nourished, in no acute distress. Head: Normocephalic, atraumatic, eyes without discharge, sclera non-icteric, nares are without discharge. Bilateral auditory canals clear, TM's are without perforation, pearly grey and translucent with reflective cone of light bilaterally. Oral cavity moist, posterior pharynx with mild erythema. No peritonsillar abscess, or post nasal drip.  Neck: Supple. No thyromegaly. Full ROM. No lymphadenopathy. Lungs: Clear bilaterally to auscultation without wheezes, rales, or rhonchi. Breathing is unlabored. Heart: RRR with S1 S2. No murmurs, rubs, or gallops appreciated. Abdomen: Soft, non-tender, non-distended with normoactive bowel sounds. No hepatomegaly. No rebound/guarding. No obvious abdominal masses. Msk:  Strength and tone normal for age. Extremities/Skin: Warm and dry. No clubbing or cyanosis. No edema. No rashes or suspicious lesions. Neuro: Alert and oriented X 3. Moves all extremities spontaneously. Gait is normal. CNII-XII grossly in tact. Psych:  Responds to questions appropriately with a normal affect.   Labs: Results for orders placed or performed during the hospital encounter of 07/16/15  Lipase, blood  Result Value Ref Range   Lipase 17 (L) 22 - 51 U/L  Comprehensive metabolic panel  Result Value Ref Range   Sodium 136 135 - 145 mmol/L   Potassium 3.7 3.5 - 5.1 mmol/L   Chloride 107 101 - 111 mmol/L   CO2 22 22 - 32 mmol/L   Glucose, Bld 99 65 - 99 mg/dL   BUN 10 6 - 20 mg/dL   Creatinine, Ser 0.68 0.44 - 1.00 mg/dL   Calcium 9.3 8.9 - 10.3 mg/dL   Total Protein 8.4 (H) 6.5 - 8.1 g/dL   Albumin 3.9 3.5 - 5.0 g/dL   AST 21 15 - 41 U/L   ALT 11 (L) 14 - 54 U/L   Alkaline Phosphatase 80 38 - 126 U/L   Total Bilirubin 0.2 (L) 0.3 - 1.2 mg/dL   GFR calc non Af Amer >60 >60 mL/min   GFR calc Af Amer >60 >60 mL/min   Anion gap 7 5 - 15  CBC   Result Value Ref Range   WBC 8.6 4.0 - 10.5 K/uL   RBC 4.11 3.87 - 5.11 MIL/uL   Hemoglobin 11.6 (L) 12.0 - 15.0 g/dL   HCT 35.7 (L) 36.0 - 46.0 %   MCV 86.9 78.0 - 100.0 fL   MCH 28.2 26.0 - 34.0 pg   MCHC 32.5 30.0 - 36.0 g/dL   RDW 13.6 11.5 - 15.5 %   Platelets 356 150 - 400 K/uL  Urinalysis, Routine w reflex microscopic (not at [facility])  Result Value Ref Range   Color, Urine YELLOW YELLOW   APPearance CLOUDY (A) CLEAR   Specific Gravity, Urine 1.024 1.005 - 1.030   pH 5.5 5.0 - 8.0   Glucose, UA NEGATIVE NEGATIVE mg/dL   Hgb urine dipstick NEGATIVE NEGATIVE   Bilirubin Urine NEGATIVE NEGATIVE   Ketones, ur NEGATIVE NEGATIVE mg/dL   Protein, ur  NEGATIVE NEGATIVE mg/dL   Urobilinogen, UA 1.0 0.0 - 1.0 mg/dL   Nitrite NEGATIVE NEGATIVE   Leukocytes, UA NEGATIVE NEGATIVE  POC urine preg, ED (not at [facility])  Result Value Ref Range   Preg Test, Ur NEGATIVE NEGATIVE      ASSESSMENT AND PLAN:  29 y.o. year old female with acute sore throat. This chart was scribed in my presence and reviewed by me personally.    ICD-9-CM ICD-10-CM   1. Sore throat 462 J02.9 POCT rapid strep A     azithromycin (ZITHROMAX) 250 MG tablet     Culture, Group A Strep  2. Fever, unspecified fever cause 780.60 R50.9 POCT rapid strep A     azithromycin (ZITHROMAX) 250 MG tablet     HYDROcodone-homatropine (HYCODAN) 5-1.5 MG/5ML syrup  3. Cough 786.2 R05 POCT rapid strep A     azithromycin (ZITHROMAX) 250 MG tablet     HYDROcodone-homatropine (HYCODAN) 5-1.5 MG/5ML syrup     By signing my name below, I, Raven Small, attest that this documentation has been prepared under the direction and in the presence of Krystal Haber, MD.  Electronically Signed: Thea Alken, ED Scribe. 07/25/2015. 11:48 AM.  Signed, Krystal Haber, MD 07/25/2015 11:40 AM

## 2015-07-27 LAB — CULTURE, GROUP A STREP: Organism ID, Bacteria: NORMAL

## 2015-07-29 ENCOUNTER — Ambulatory Visit (HOSPITAL_COMMUNITY)
Admission: RE | Admit: 2015-07-29 | Discharge: 2015-07-29 | Disposition: A | Discharge status: Home / Self Care | Payer: BC Managed Care – PPO | Source: Ambulatory Visit | Attending: Gastroenterology | Admitting: Gastroenterology

## 2015-07-29 DIAGNOSIS — R112 Nausea with vomiting, unspecified: Secondary | ICD-10-CM | POA: Diagnosis not present

## 2015-07-29 DIAGNOSIS — R1013 Epigastric pain: Secondary | ICD-10-CM | POA: Diagnosis present

## 2015-07-29 DIAGNOSIS — R110 Nausea: Secondary | ICD-10-CM

## 2015-07-29 MED ORDER — TECHNETIUM TC 99M MEBROFENIN IV KIT
5.2000 | PACK | Freq: Once | INTRAVENOUS | Status: DC | PRN
  Administered 2015-07-29: 5 via INTRAVENOUS
  Filled 2015-07-29: qty 6

## 2015-07-29 MED ORDER — SINCALIDE 5 MCG IJ SOLR
INTRAMUSCULAR | Status: AC
  Filled 2015-07-29: qty 5

## 2015-07-29 MED ORDER — SINCALIDE 5 MCG IJ SOLR
0.0200 ug/kg | Freq: Once | INTRAMUSCULAR | Status: AC
  Administered 2015-07-29: 2.3 ug via INTRAVENOUS

## 2015-07-29 MED ORDER — STERILE WATER FOR INJECTION IJ SOLN
INTRAMUSCULAR | Status: AC
  Administered 2015-07-29: 6 mL
  Filled 2015-07-29: qty 10

## 2015-08-04 ENCOUNTER — Ambulatory Visit (HOSPITAL_COMMUNITY): Payer: BC Managed Care – PPO

## 2015-08-13 ENCOUNTER — Other Ambulatory Visit: Payer: Self-pay | Admitting: General Surgery

## 2016-01-05 ENCOUNTER — Ambulatory Visit (INDEPENDENT_AMBULATORY_CARE_PROVIDER_SITE_OTHER): Payer: BC Managed Care – PPO | Admitting: Family Medicine

## 2016-01-05 ENCOUNTER — Telehealth: Payer: Self-pay | Admitting: Family Medicine

## 2016-01-05 ENCOUNTER — Encounter: Payer: Self-pay | Admitting: Family Medicine

## 2016-01-05 VITALS — BP 122/90 | HR 100 | Temp 98.4°F | Ht 64.0 in | Wt 276.2 lb

## 2016-01-05 DIAGNOSIS — R05 Cough: Secondary | ICD-10-CM | POA: Diagnosis not present

## 2016-01-05 DIAGNOSIS — R0989 Other specified symptoms and signs involving the circulatory and respiratory systems: Secondary | ICD-10-CM | POA: Diagnosis not present

## 2016-01-05 DIAGNOSIS — J209 Acute bronchitis, unspecified: Secondary | ICD-10-CM

## 2016-01-05 DIAGNOSIS — J029 Acute pharyngitis, unspecified: Secondary | ICD-10-CM | POA: Diagnosis not present

## 2016-01-05 DIAGNOSIS — R059 Cough, unspecified: Secondary | ICD-10-CM

## 2016-01-05 DIAGNOSIS — R509 Fever, unspecified: Secondary | ICD-10-CM | POA: Diagnosis not present

## 2016-01-05 LAB — POCT INFLUENZA A/B
Influenza A, POC: NEGATIVE
Influenza B, POC: NEGATIVE

## 2016-01-05 MED ORDER — BENZONATATE 100 MG PO CAPS: 100.0000 mg | ORAL_CAPSULE | Freq: Three times a day (TID) | ORAL | Status: DC | PRN

## 2016-01-05 MED ORDER — HYDROCODONE-HOMATROPINE 5-1.5 MG/5ML PO SYRP: 5.0000 mL | ORAL_SOLUTION | Freq: Three times a day (TID) | ORAL | Status: DC | PRN

## 2016-01-05 MED ORDER — AZITHROMYCIN 250 MG PO TABS: ORAL_TABLET | ORAL | Status: DC

## 2016-01-05 MED ORDER — IBUPROFEN 200 MG PO TABS: 400.0000 mg | ORAL_TABLET | Freq: Once | ORAL | Status: DC

## 2016-01-05 NOTE — Telephone Encounter (Signed)
Called pt to ask about the flu shot. Unable to lvm.

## 2016-01-05 NOTE — Progress Notes (Signed)
[place] at [facility] [address] [place], [place] [address] [phone]  Date:  01/05/2016   Name:  Krystal Harris   DOB:  1986-02-10   MRN:  [id number]  PCP:  [name], MD    Chief Complaint: URI   History of Present Illness:  Krystal Harris is a 30 y.o. very pleasant female patient who presents with the following:  Generally healthy young lady here today with illness.  Today is Wednesday.  On Saturday she started to have a cough.  By Monday the cough was worse and now her chest hurts with cough.  She is tired but not achy No ST, she does have a runny nose and sneezing No GI symptoms No meds used today but yesterday she used some OTC cold preps/ antipyretics  LMP 3/4   Patient Active Problem List   Diagnosis Date Noted  . Left sided abdominal pain 07/14/2015  . Routine general medical examination at a health care facility 02/21/2013  . Low back pain 02/21/2013  . Left ankle swelling 02/21/2013    Past Medical History  Diagnosis Date  . Chicken pox   . Vitamin D deficiency   . Kidney stones     Past Surgical History  Procedure Laterality Date  . Wisdom tooth extraction    . Foreign body removal Right 09/17/2013    Procedure: REMOVAL FOREIGN BODY NEEDLE PLANTAR ASPECT OF THE FOOT;  Surgeon: Newt Minion, MD;  Location: [place] [facility];  Service: Orthopedics;  Laterality: Right;  Remove Foreign Body Right Foot    Social History  Substance Use Topics  . Smoking status: Never Smoker   . Smokeless tobacco: Never Used  . Alcohol Use: Yes     Comment: occasionally    Family History  Problem Relation Age of Onset  . Diabetes Mother   . Hypertension Mother   . Stroke Maternal Grandmother   . Cancer Maternal Grandfather     lung  . Diabetes Maternal Grandfather   . Stroke Maternal Grandfather   . Hypertension Maternal Grandfather     Allergies  Allergen Reactions  . Penicillins Hives and Nausea And  Vomiting    Has patient had a PCN reaction causing immediate rash, facial/tongue/throat swelling, SOB or lightheadedness with hypotension: no Has patient had a PCN reaction causing severe rash involving mucus membranes or skin necrosis: unknown Has patient had a PCN reaction that required hospitalization unknown Has patient had a PCN reaction occurring within the last 10 years: unknown If all of the above answers are "NO", then may proceed with Cephalosporin use.     Medication list has been reviewed and updated.  Current Outpatient Prescriptions on File Prior to Visit  Medication Sig Dispense Refill  . omeprazole (PRILOSEC) 40 MG capsule Take 1 capsule (40 mg total) by mouth daily. 30 capsule 3  . sucralfate (CARAFATE) 1 G tablet Take 1 tablet (1 g total) by mouth 4 (four) times daily -  with meals and at bedtime. 90 tablet 0   No current facility-administered medications on file prior to visit.    Review of Systems:  As per HPI- otherwise negative.   Physical Examination: Filed Vitals:   01/05/16 1536  BP: 122/90  Pulse: 116  Temp: 99.3 F (37.4 C)   Filed Vitals:   01/05/16 1536  Height: 5\' 4"  (1.626 m)  Weight: 276 lb 3.2 oz (125.283 kg)   Body mass index is 47.39  kg/(m^2). Ideal Body Weight: Weight in (lb) to have BMI = 25: 145.3  GEN: WDWN, NAD, Non-toxic, A & O x 3, obese, looks well HEENT: Atraumatic, Normocephalic. Neck supple. No masses, No LAD.  Bilateral TM wnl, oropharynx normal.  PEERL,EOMI.   Ears and Nose: No external deformity. CV: RRR, No M/G/R. No JVD. No thrill. No extra heart sounds. PULM: CTA B, no wheezes, crackles, rhonchi. No retractions. No resp. distress. No accessory muscle use. EXTR: No c/c/e NEURO Normal gait.  PSYCH: Normally interactive. Conversant. Not depressed or anxious appearing.  Calm demeanor.   Given 400 mg of ibuprofen and recheck temp and pulse 20 min later Results for orders placed or performed in visit on 01/05/16  POCT  Influenza A/B  Result Value Ref Range   Influenza A, POC Negative Negative   Influenza B, POC Negative Negative   Assessment and Plan: Fever, unspecified - Plan: POCT Influenza A/B, ibuprofen (ADVIL,MOTRIN) tablet 400 mg  Cough - Plan: POCT Influenza A/B, ibuprofen (ADVIL,MOTRIN) tablet 400 mg, HYDROcodone-homatropine (HYCODAN) 5-1.5 MG/5ML syrup, azithromycin (ZITHROMAX) 250 MG tablet, benzonatate (TESSALON) 100 MG capsule, DISCONTINUED: benzonatate (TESSALON) 100 MG capsule, DISCONTINUED: azithromycin (ZITHROMAX) 250 MG tablet  Chest congestion - Plan: POCT Influenza A/B, ibuprofen (ADVIL,MOTRIN) tablet 400 mg  Sore throat  Fever, unspecified fever cause - Plan: HYDROcodone-homatropine (HYCODAN) 5-1.5 MG/5ML syrup  Acute bronchitis, unspecified organism - Plan: azithromycin (ZITHROMAX) 250 MG tablet, DISCONTINUED: azithromycin (ZITHROMAX) 250 MG tablet  Here today with fever and cough.  Flu test negative.  Will treat for bronchitis/ walking pneumonia with azithromycin, tessalon and hycodan for night Encouraged rest and hydration  See patient instructions for more details.   Meds ordered this encounter  Medications  . ibuprofen (ADVIL,MOTRIN) tablet 400 mg    Sig:   . DISCONTD: benzonatate (TESSALON) 100 MG capsule    Sig: Take 1 capsule (100 mg total) by mouth 3 (three) times daily as needed for cough.    Dispense:  40 capsule    Refill:  0  . DISCONTD: azithromycin (ZITHROMAX) 250 MG tablet    Sig: Take 2 tabs PO x 1 dose, then 1 tab PO QD x 4 days    Dispense:  6 tablet    Refill:  0  . HYDROcodone-homatropine (HYCODAN) 5-1.5 MG/5ML syrup    Sig: Take 5 mLs by mouth every 8 (eight) hours as needed for cough.    Dispense:  120 mL    Refill:  0  . azithromycin (ZITHROMAX) 250 MG tablet    Sig: Take 2 tabs PO x 1 dose, then 1 tab PO QD x 4 days    Dispense:  6 tablet    Refill:  0  . benzonatate (TESSALON) 100 MG capsule    Sig: Take 1 capsule (100 mg total) by mouth 3  (three) times daily as needed for cough.    Dispense:  40 capsule    Refill:  0     Signed Lamar Blinks, MD

## 2016-01-05 NOTE — Patient Instructions (Signed)
We are going to treat you for bronchitis with azithromycin (antibiotic) and also with tessalon perles for cough and hycodan cough syrup for cough at night. The cough syrup will make you sleepy so do not use it when you have to drive. Let me know if you are not getting better in the next few days- Sooner if worse.

## 2016-01-10 ENCOUNTER — Ambulatory Visit (INDEPENDENT_AMBULATORY_CARE_PROVIDER_SITE_OTHER): Payer: BC Managed Care – PPO | Admitting: Family Medicine

## 2016-01-10 ENCOUNTER — Ambulatory Visit (HOSPITAL_BASED_OUTPATIENT_CLINIC_OR_DEPARTMENT_OTHER)
Admission: RE | Admit: 2016-01-10 | Discharge: 2016-01-10 | Disposition: A | Discharge status: Home / Self Care | Payer: BC Managed Care – PPO | Source: Ambulatory Visit | Attending: Family Medicine | Admitting: Family Medicine

## 2016-01-10 VITALS — BP 108/84 | HR 96 | Temp 99.2°F | Ht 64.5 in | Wt 271.0 lb

## 2016-01-10 DIAGNOSIS — R059 Cough, unspecified: Secondary | ICD-10-CM

## 2016-01-10 DIAGNOSIS — R509 Fever, unspecified: Secondary | ICD-10-CM | POA: Diagnosis not present

## 2016-01-10 DIAGNOSIS — R918 Other nonspecific abnormal finding of lung field: Secondary | ICD-10-CM | POA: Insufficient documentation

## 2016-01-10 DIAGNOSIS — R05 Cough: Secondary | ICD-10-CM

## 2016-01-10 MED ORDER — HYDROCODONE-HOMATROPINE 5-1.5 MG/5ML PO SYRP: 5.0000 mL | ORAL_SOLUTION | Freq: Three times a day (TID) | ORAL | Status: DC | PRN

## 2016-01-10 MED ORDER — PREDNISONE 20 MG PO TABS: ORAL_TABLET | ORAL | Status: DC

## 2016-01-10 NOTE — Patient Instructions (Signed)
Please go downstairs for a chest x-ray to make sure you do not have pneumonia. I will send you your result on mychart and will call if any other treatment is needed.  Otherwise, please take 1 pill of prednisone daily for 5 days. Continue to use OTC medications and the cough syrup as needed.  However remember that the cough syrup can be habit forming- start tapering off your use now.    Let me know if you are not doing better by the end of the week- Sooner if worse.

## 2016-01-10 NOTE — Progress Notes (Signed)
[place] at [facility] [address], [place], [place] [address] [phone]  Date:  01/10/2016   Name:  Krystal Harris   DOB:  Mar 15, 1986   MRN:  [id number]  PCP:  [name], MD    Chief Complaint: Cough   History of Present Illness:  Krystal Harris is a 30 y.o. very pleasant female patient who presents with the following:  She was seen here last week with illness- HPI from that visit:  Generally healthy young lady here today with illness. Today is Wednesday.  On Saturday she started to have a cough. By Monday the cough was worse and now her chest hurts with cough.  She is tired but not achy No ST, she does have a runny nose and sneezing No GI symptoms No meds used today but yesterday she used some OTC cold preps/ antipyretics  LMP 12/18/2015   Negative flu swab, She was treated with azithromycin and tessalon, hycodan. She finished the abx yesterday.  She notes that her sx are now more nasal congestion and her ears will pop. She may cough up some blood tinged mucus.  She is still coughing.    She has noted a temp up to 99.6, T max of 100.2 on Saturday.   She does have a ST, no GI symptoms She is using the cough medication- she is about out of the hycodan. It does help her to sleep.     Patient Active Problem List   Diagnosis Date Noted  . Left sided abdominal pain 07/14/2015  . Routine general medical examination at a health care facility 02/21/2013  . Low back pain 02/21/2013  . Left ankle swelling 02/21/2013    Past Medical History  Diagnosis Date  . Chicken pox   . Vitamin D deficiency   . Kidney stones     Past Surgical History  Procedure Laterality Date  . Wisdom tooth extraction    . Foreign body removal Right 09/17/2013    Procedure: REMOVAL FOREIGN BODY NEEDLE PLANTAR ASPECT OF THE FOOT;  Surgeon: Newt Minion, MD;  Location: [facility];  Service: Orthopedics;  Laterality: Right;  Remove Foreign  Body Right Foot    Social History  Substance Use Topics  . Smoking status: Never Smoker   . Smokeless tobacco: Never Used  . Alcohol Use: Yes     Comment: occasionally    Family History  Problem Relation Age of Onset  . Diabetes Mother   . Hypertension Mother   . Stroke Maternal Grandmother   . Cancer Maternal Grandfather     lung  . Diabetes Maternal Grandfather   . Stroke Maternal Grandfather   . Hypertension Maternal Grandfather     Allergies  Allergen Reactions  . Penicillins Hives and Nausea And Vomiting    Has patient had a PCN reaction causing immediate rash, facial/tongue/throat swelling, SOB or lightheadedness with hypotension: no Has patient had a PCN reaction causing severe rash involving mucus membranes or skin necrosis: unknown Has patient had a PCN reaction that required hospitalization unknown Has patient had a PCN reaction occurring within the last 10 years: unknown If all of the above answers are "NO", then may proceed with Cephalosporin use.     Medication list has been reviewed and updated.  Current Outpatient Prescriptions on File Prior to Visit  Medication Sig Dispense Refill  . benzonatate (TESSALON) 100 MG capsule Take 1 capsule (100 mg total) by mouth 3 (  three) times daily as needed for cough. 40 capsule 0  . HYDROcodone-homatropine (HYCODAN) 5-1.5 MG/5ML syrup Take 5 mLs by mouth every 8 (eight) hours as needed for cough. 120 mL 0  . omeprazole (PRILOSEC) 40 MG capsule Take 1 capsule (40 mg total) by mouth daily. 30 capsule 3  . sucralfate (CARAFATE) 1 G tablet Take 1 tablet (1 g total) by mouth 4 (four) times daily -  with meals and at bedtime. 90 tablet 0   Current Facility-Administered Medications on File Prior to Visit  Medication Dose Route Frequency Provider Last Rate Last Dose  . ibuprofen (ADVIL,MOTRIN) tablet 400 mg  400 mg Oral Once Darreld Mclean, MD        Review of Systems:  As per HPI- otherwise negative.   Physical  Examination: Filed Vitals:   01/10/16 1057  Pulse: 112  Temp: 99.2 F (37.3 C)   Filed Vitals:   01/10/16 1057  Height: 5' 4.5" (1.638 m)  Weight: 271 lb (122.925 kg)   Body mass index is 45.82 kg/(m^2). Ideal Body Weight: Weight in (lb) to have BMI = 25: 147.6  GEN: WDWN, NAD, Non-toxic, A & O x 3, obese, looks well but is coughing some in room HEENT: Atraumatic, Normocephalic. Neck supple. No masses, No LAD.  Bilateral TM show clear fluid effusion oropharynx normal.  PEERL,EOMI.   Ears and Nose: No external deformity. CV: RRR, No M/G/R. No JVD. No thrill. No extra heart sounds. PULM: CTA B, no wheezes, crackles, rhonchi. No retractions. No resp. distress. No accessory muscle use. EXTR: No c/c/e NEURO Normal gait.  PSYCH: Normally interactive. Conversant. Not depressed or anxious appearing.  Calm demeanor.    Assessment and Plan Fever, unspecified fever cause - Plan: DG Chest 2 View, HYDROcodone-homatropine (HYCODAN) 5-1.5 MG/5ML syrup  Cough - Plan: DG Chest 2 View, HYDROcodone-homatropine (HYCODAN) 5-1.5 MG/5ML syrup, predniSONE (DELTASONE) 20 MG tablet  Here today with persistent illness.   Add prednisone for 5 days, CXR today to ensure no lobar pneumonia.  Counseled to taper off hycodan and she plans to do so No history of DM  Pulse Readings from Last 3 Encounters:  01/10/16 96  01/05/16 100  07/25/15 109     Signed Lamar Blinks, MD

## 2016-03-17 ENCOUNTER — Ambulatory Visit (INDEPENDENT_AMBULATORY_CARE_PROVIDER_SITE_OTHER): Payer: BC Managed Care – PPO | Admitting: Family Medicine

## 2016-03-17 ENCOUNTER — Telehealth: Payer: Self-pay | Admitting: Family Medicine

## 2016-03-17 ENCOUNTER — Encounter: Payer: Self-pay | Admitting: Family Medicine

## 2016-03-17 VITALS — BP 121/84 | HR 81 | Temp 98.2°F | Ht 64.5 in | Wt 282.2 lb

## 2016-03-17 DIAGNOSIS — R202 Paresthesia of skin: Secondary | ICD-10-CM | POA: Diagnosis not present

## 2016-03-17 DIAGNOSIS — R200 Anesthesia of skin: Secondary | ICD-10-CM

## 2016-03-17 NOTE — Telephone Encounter (Signed)
Called pt and left detailed message to return call for further assessment and scheduling. Advised in message to be seen in ED immediately over the weekend if alarm symptoms (weakness of extremity, slurred speech, facial drooping).

## 2016-03-17 NOTE — Telephone Encounter (Signed)
Agree w/ advice given 

## 2016-03-17 NOTE — Telephone Encounter (Signed)
Message Forwarded to nurse at [facility] location for triage.

## 2016-03-17 NOTE — Patient Instructions (Signed)

## 2016-03-17 NOTE — Progress Notes (Signed)
Pre visit review using our clinic review tool, if applicable. No additional management support is needed unless otherwise documented below in the visit note. 

## 2016-03-17 NOTE — Telephone Encounter (Signed)
[place] Patient Name: Krystal Harris DOB: [birth year] Initial Comment Caller states she has a numbness and a tingling feeling in her right thumb and it eventually goes to her whole hand. This has been going on for about 2 weeks and it comes at different times. Nurse Assessment Nurse: Dimas Chyle, RN, Dellis Filbert Date/Time Eilene Ghazi Time): 03/17/2016 3:26:07 PM Confirm and document reason for call. If symptomatic, describe symptoms. You must click the next button to save text entered. ---Caller states she has a numbness and a tingling feeling in her right thumb and it eventually goes to her whole hand. This has been going on for about 2 weeks and it comes at different times. Has the patient traveled out of the country within the last 30 days? ---No Does the patient have any new or worsening symptoms? ---Yes Will a triage be completed? ---Yes Related visit to physician within the last 2 weeks? ---No Does the PT have any chronic conditions? (i.e. diabetes, asthma, etc.) ---No Is the patient pregnant or possibly pregnant? (Ask all females between the ages of 15-55) ---No Is this a behavioral health or substance abuse call? ---No Guidelines Guideline Title Affirmed Question Affirmed Notes Neurologic Deficit [1] Tingling (e.g., pins and needles) of the face, arm / hand, or leg / foot on one side of the body AND [2] present now Final Disposition User See Physician within 4 Hours (or PCP triage) Dimas Chyle, RN, Dellis Filbert Referrals REFERRED TO PCP OFFICE REFERRED TO PCP OFFICE Disagree/Comply: Comply

## 2016-03-18 DIAGNOSIS — R200 Anesthesia of skin: Secondary | ICD-10-CM | POA: Insufficient documentation

## 2016-03-18 DIAGNOSIS — R202 Paresthesia of skin: Principal | ICD-10-CM

## 2016-03-18 NOTE — Assessment & Plan Note (Signed)
Wrist splint r hand Ice , rest If no improvement refer to hand surgeon

## 2016-03-18 NOTE — Progress Notes (Signed)
Patient ID: Krystal Harris, female    DOB: [birth year]  Age: 30 y.o. MRN: [id number]    Subjective:  Subjective HPI Krystal Harris presents for c/o R hand numbness and tingling x 2 weeks.  She does sleep on her arm at times.  Numbness and tingling occurs off and on all day.    Review of Systems  Constitutional: Negative for diaphoresis, appetite change, fatigue and unexpected weight change.  Eyes: Negative for pain, redness and visual disturbance.  Respiratory: Negative for cough, chest tightness, shortness of breath and wheezing.   Cardiovascular: Negative for chest pain, palpitations and leg swelling.  Endocrine: Negative for cold intolerance, heat intolerance, polydipsia, polyphagia and polyuria.  Genitourinary: Negative for dysuria, frequency and difficulty urinating.  Neurological: Positive for numbness. Negative for dizziness, light-headedness and headaches.    History Past Medical History  Diagnosis Date  . Chicken pox   . Vitamin D deficiency   . Kidney stones     She has past surgical history that includes Wisdom tooth extraction and Foreign Body Removal (Right, 09/17/2013).   Her family history includes Cancer in her maternal grandfather; Diabetes in her maternal grandfather and mother; Hypertension in her maternal grandfather and mother; Stroke in her maternal grandfather and maternal grandmother.She reports that she has never smoked. She has never used smokeless tobacco. She reports that she drinks alcohol. She reports that she does not use illicit drugs.  Current Outpatient Prescriptions on File Prior to Visit  Medication Sig Dispense Refill  . omeprazole (PRILOSEC) 40 MG capsule Take 1 capsule (40 mg total) by mouth daily. 30 capsule 3  . sucralfate (CARAFATE) 1 G tablet Take 1 tablet (1 g total) by mouth 4 (four) times daily -  with meals and at bedtime. 90 tablet 0   No current facility-administered medications on file prior to visit.     Objective:   Objective Physical Exam  Constitutional: She is oriented to person, place, and time. She appears well-developed and well-nourished.  HENT:  Head: Normocephalic and atraumatic.  Eyes: Conjunctivae and EOM are normal.  Neck: Normal range of motion. Neck supple. No JVD present. Carotid bruit is not present. No thyromegaly present.  Cardiovascular: Normal rate, regular rhythm and normal heart sounds.   No murmur heard. Pulmonary/Chest: Effort normal and breath sounds normal. No respiratory distress. She has no wheezes. She has no rales. She exhibits no tenderness.  Musculoskeletal: She exhibits no edema.  Neurological: She is alert and oriented to person, place, and time.  Tingling R hand all fingers except 5 th finger  Psychiatric: She has a normal mood and affect. Her behavior is normal. Thought content normal.   BP 121/84 mmHg  Pulse 81  Temp(Src) 98.2 F (36.8 C) (Oral)  Ht 5' 4.5" (1.638 m)  Wt 282 lb 3.2 oz (128.005 kg)  BMI 47.71 kg/m2  SpO2 98% Wt Readings from Last 3 Encounters:  03/17/16 282 lb 3.2 oz (128.005 kg)  01/10/16 271 lb (122.925 kg)  01/05/16 276 lb 3.2 oz (125.283 kg)     Lab Results  Component Value Date   WBC 8.6 07/16/2015   HGB 11.6* 07/16/2015   HCT 35.7* 07/16/2015   PLT 356 07/16/2015   GLUCOSE 99 07/16/2015   CHOL 150 02/21/2013   TRIG 47.0 02/21/2013   HDL 52.00 02/21/2013   LDLCALC 89 02/21/2013   ALT 11* 07/16/2015   AST 21 07/16/2015   NA 136 07/16/2015   K 3.7 07/16/2015   CL 107 07/16/2015  CREATININE 0.68 07/16/2015   BUN 10 07/16/2015   CO2 22 07/16/2015   TSH 1.44 02/21/2013    Dg Chest 2 View  01/10/2016  CLINICAL DATA:  Cough, chest congestion for the past week with onset of hemoptysis 2 days ago, clinical diagnosis of bronchitis made 5 days ago. Nonsmoker. CT scan of the chest of April 04, 2010 chest x-ray of the same day EXAM: CHEST  2 VIEW COMPARISON:  Chest x-ray of July 05, 2010 FINDINGS: The lungs are adequately  inflated. There is no focal infiltrate. There is increased interstitial density in the perihilar regions greatest on the right. There is no pleural effusion. The heart and pulmonary vascularity are normal. The mediastinum is normal in width. IMPRESSION: Mild perihilar prominence especially on the right suggests subsegmental atelectasis possibly reflecting acute bronchitis. There is no alveolar pneumonia. Electronically Signed   By: David  Martinique M.D.   On: 01/10/2016 12:12     Assessment & Plan:  Plan I have discontinued Krystal Harris's benzonatate, HYDROcodone-homatropine, and predniSONE. I am also having her maintain her omeprazole and sucralfate. We will stop administering ibuprofen.  No orders of the defined types were placed in this encounter.    Problem List Items Addressed This Visit    Numbness and tingling in right hand - Primary    Wrist splint r hand Ice , rest If no improvement refer to hand surgeon         Follow-up: Return if symptoms worsen or fail to improve.  Ann Held, DO

## 2016-04-05 ENCOUNTER — Encounter: Payer: Self-pay | Admitting: Family Medicine

## 2016-04-05 DIAGNOSIS — R200 Anesthesia of skin: Secondary | ICD-10-CM

## 2016-04-05 DIAGNOSIS — R202 Paresthesia of skin: Principal | ICD-10-CM

## 2016-04-06 NOTE — Telephone Encounter (Signed)
Ref placed.      KP 

## 2016-04-06 NOTE — Telephone Encounter (Signed)
Refer to hand surgeon 

## 2016-06-22 ENCOUNTER — Ambulatory Visit (INDEPENDENT_AMBULATORY_CARE_PROVIDER_SITE_OTHER): Payer: BC Managed Care – PPO | Admitting: Family Medicine

## 2016-06-22 ENCOUNTER — Encounter: Payer: Self-pay | Admitting: Family Medicine

## 2016-06-22 VITALS — BP 108/80 | HR 122 | Temp 98.9°F | Ht 64.0 in | Wt 287.0 lb

## 2016-06-22 DIAGNOSIS — B9789 Other viral agents as the cause of diseases classified elsewhere: Principal | ICD-10-CM

## 2016-06-22 DIAGNOSIS — H6981 Other specified disorders of Eustachian tube, right ear: Secondary | ICD-10-CM

## 2016-06-22 DIAGNOSIS — J069 Acute upper respiratory infection, unspecified: Secondary | ICD-10-CM | POA: Diagnosis not present

## 2016-06-22 MED ORDER — METHYLPREDNISOLONE 4 MG PO TBPK: ORAL_TABLET | ORAL | 0 refills | Status: DC

## 2016-06-22 NOTE — Patient Instructions (Signed)
Claritin (loratadine), Allegra (fexofenadine), Zyrtec (cetirizine); This is listed in order from weakest to strongest.  Flonase (fluticasone); 2 sprays each side once daily. Aim towards same side eye.  There are available OTC, and the generic versions, which may be cheaper, are in parentheses. Show this to a pharmacist if you have trouble finding any of these items.

## 2016-06-22 NOTE — Progress Notes (Signed)
Pre visit review using our clinic review tool, if applicable. No additional management support is needed unless otherwise documented below in the visit note. 

## 2016-06-22 NOTE — Progress Notes (Signed)
Chief Complaint  Patient presents with  . Nasal Congestion    and chest-sxs started on Sat-cough (product-yellowish)      Subjective: Patient is a 30 y.o. female here for nasal congestion and productive cough.  Started 5 days ago. She has facial pressure, nasal congestion/rhinnorhea, R ear pain/pressure, and a productive cough. Denies fevers, sick contacts, sore throat, eye complaints, or shortness of breath. No hx of asthma, COPD or smoking.  ROS: HEENT: As noted in HPI Lungs: Denies SOB, +cough  Family History  Problem Relation Age of Onset  . Diabetes Mother   . Hypertension Mother   . Stroke Maternal Grandmother   . Cancer Maternal Grandfather     lung  . Diabetes Maternal Grandfather   . Stroke Maternal Grandfather   . Hypertension Maternal Grandfather    Past Medical History:  Diagnosis Date  . Chicken pox   . Kidney stones   . Vitamin D deficiency    Allergies  Allergen Reactions  . Penicillins Hives and Nausea And Vomiting    Has patient had a PCN reaction causing immediate rash, facial/tongue/throat swelling, SOB or lightheadedness with hypotension: no Has patient had a PCN reaction causing severe rash involving mucus membranes or skin necrosis: unknown Has patient had a PCN reaction that required hospitalization unknown Has patient had a PCN reaction occurring within the last 10 years: unknown If all of the above answers are "NO", then may proceed with Cephalosporin use.     Current Outpatient Prescriptions:  .  omeprazole (PRILOSEC) 40 MG capsule, Take 1 capsule (40 mg total) by mouth daily., Disp: 30 capsule, Rfl: 3 .  sucralfate (CARAFATE) 1 G tablet, Take 1 tablet (1 g total) by mouth 4 (four) times daily -  with meals and at bedtime., Disp: 90 tablet, Rfl: 0 .  methylPREDNISolone (MEDROL DOSEPAK) 4 MG TBPK tablet, Follow instructions on package., Disp: 21 tablet, Rfl: 0  Objective: BP 108/80 (BP Location: Left Arm, Patient Position: Sitting, Cuff Size:  Large)   Pulse (!) 122   Temp 98.9 F (37.2 C) (Oral)   Ht 5\' 4"  (1.626 m)   Wt 287 lb (130.2 kg)   LMP 06/07/2016 (Exact Date)   SpO2 99%   BMI 49.26 kg/m  General: Awake, appears stated age HEENT: MMM, pharynx not erythematous or with exudates, EOMi, nares patent, turbinates swollen, greater on the R. Ears patent, TM's negative.  Heart: RRR, no murmurs Lungs: CTAB, no rales, wheezes or rhonchi. Normal effort Psych: Age appropriate judgment and insight, normal affect and mood  Assessment and Plan: Viral URI with cough  ETD (eustachian tube dysfunction), right - Plan: methylPREDNISolone (MEDROL DOSEPAK) 4 MG TBPK tablet  Orders as above. Instructed to use daily antihistamine and daily Flonase as well.  Letter for work given. F/u if symptoms worsen or fail to improve. The patient voiced understanding and agreement to the plan.  [place], DO 06/22/16  4:28 PM

## 2016-08-23 ENCOUNTER — Encounter: Payer: Self-pay | Admitting: Family Medicine

## 2016-08-23 ENCOUNTER — Ambulatory Visit (INDEPENDENT_AMBULATORY_CARE_PROVIDER_SITE_OTHER): Payer: BC Managed Care – PPO | Admitting: Family Medicine

## 2016-08-23 VITALS — BP 120/78 | HR 93 | Temp 98.3°F | Ht 64.0 in | Wt 284.4 lb

## 2016-08-23 DIAGNOSIS — R1031 Right lower quadrant pain: Secondary | ICD-10-CM

## 2016-08-23 DIAGNOSIS — Z23 Encounter for immunization: Secondary | ICD-10-CM | POA: Diagnosis not present

## 2016-08-23 DIAGNOSIS — R197 Diarrhea, unspecified: Secondary | ICD-10-CM | POA: Diagnosis not present

## 2016-08-23 MED ORDER — DICYCLOMINE HCL 10 MG PO CAPS: 10.0000 mg | ORAL_CAPSULE | Freq: Three times a day (TID) | ORAL | 0 refills | Status: DC

## 2016-08-23 NOTE — Progress Notes (Signed)
Chief Complaint  Patient presents with  . Abdominal Pain    lower (R) side-started on Monday-she had diarrhea and gas on Sunday    Krystal Harris is here for abdominal pain.  Duration: 4 days Nighttime awakenings? No Bleeding? No Weight loss? No  Aching/crampy pain in RLQ Palliation: Laying down Provocation: Eating and drinking Associated symptoms: diarrhea, pain with food Denies: fever, nausea and vomiting Treatment to date: Tylenol  ROS: Constitutional: No fevers GI: No N/V/D/C, no bleeding + pain  Past Medical History:  Diagnosis Date  . Chicken pox   . Kidney stones   . Vitamin D deficiency    Family History  Problem Relation Age of Onset  . Diabetes Mother   . Hypertension Mother   . Stroke Maternal Grandmother   . Cancer Maternal Grandfather     lung  . Diabetes Maternal Grandfather   . Stroke Maternal Grandfather   . Hypertension Maternal Grandfather    Past Surgical History:  Procedure Laterality Date  . FOREIGN BODY REMOVAL Right 09/17/2013   Procedure: REMOVAL FOREIGN BODY NEEDLE PLANTAR ASPECT OF THE FOOT;  Surgeon: Newt Minion, MD;  Location: [facility];  Service: Orthopedics;  Laterality: Right;  Remove Foreign Body Right Foot  . WISDOM TOOTH EXTRACTION      BP 120/78 (BP Location: Left Arm, Patient Position: Sitting, Cuff Size: Large)   Pulse 93   Temp 98.3 F (36.8 C) (Oral)   Ht 5\' 4"  (1.626 m)   Wt 284 lb 6.4 oz (129 kg)   LMP 08/04/2016   SpO2 99%   BMI 48.82 kg/m  Gen.: Awake, alert, appears stated age 30: Mucous membranes moist without mucosal lesions Heart: Regular rate and rhythm without murmurs, no LE edema Lungs: Clear auscultation bilaterally, no rales or wheezing, normal effort without accessory muscle use. Abdomen: Bowel sounds are present. Abdomen is soft, TTP in RLQ and suprapubic region, nondistended, no masses or organomegaly. Negative Murphy's, Rovsing's, and Carnett's sign. MSK: Gait normal Psych: Age appropriate judgment  and insight. Normal mood and affect.  Diarrhea, unspecified type - Plan: Stool Culture, Ova and parasite examination  Right lower quadrant abdominal pain - Plan: dicyclomine (BENTYL) 10 MG capsule  Orders as above. R/o infectious diarrhea. F/u prn. Pt voiced understanding and agreement to the plan.  Pen Mar, DO 08/23/16 4:25 PM

## 2016-08-23 NOTE — Progress Notes (Signed)
Pre visit review using our clinic review tool, if applicable. No additional management support is needed unless otherwise documented below in the visit note. 

## 2016-09-27 ENCOUNTER — Encounter: Payer: Self-pay | Admitting: General Practice

## 2016-09-27 ENCOUNTER — Ambulatory Visit (INDEPENDENT_AMBULATORY_CARE_PROVIDER_SITE_OTHER): Payer: BC Managed Care – PPO | Admitting: Family Medicine

## 2016-09-27 ENCOUNTER — Encounter: Payer: Self-pay | Admitting: Family Medicine

## 2016-09-27 DIAGNOSIS — K760 Fatty (change of) liver, not elsewhere classified: Secondary | ICD-10-CM | POA: Diagnosis not present

## 2016-09-27 DIAGNOSIS — J069 Acute upper respiratory infection, unspecified: Secondary | ICD-10-CM

## 2016-09-27 DIAGNOSIS — B9789 Other viral agents as the cause of diseases classified elsewhere: Secondary | ICD-10-CM | POA: Diagnosis not present

## 2016-09-27 LAB — CBC WITH DIFFERENTIAL/PLATELET
BASOS ABS: 0 10*3/uL (ref 0.0–0.1)
Basophils Relative: 0.5 % (ref 0.0–3.0)
EOS ABS: 0.5 10*3/uL (ref 0.0–0.7)
EOS PCT: 6.6 % — AB (ref 0.0–5.0)
HCT: 36.3 % (ref 36.0–46.0)
HEMOGLOBIN: 11.8 g/dL — AB (ref 12.0–15.0)
Lymphocytes Relative: 15.5 % (ref 12.0–46.0)
Lymphs Abs: 1.2 10*3/uL (ref 0.7–4.0)
MCHC: 32.5 g/dL (ref 30.0–36.0)
MCV: 83 fl (ref 78.0–100.0)
MONO ABS: 0.5 10*3/uL (ref 0.1–1.0)
Monocytes Relative: 6.6 % (ref 3.0–12.0)
Neutro Abs: 5.3 10*3/uL (ref 1.4–7.7)
Neutrophils Relative %: 70.8 % (ref 43.0–77.0)
Platelets: 355 10*3/uL (ref 150.0–400.0)
RBC: 4.37 Mil/uL (ref 3.87–5.11)
RDW: 15.2 % (ref 11.5–15.5)
WBC: 7.5 10*3/uL (ref 4.0–10.5)

## 2016-09-27 LAB — HEPATIC FUNCTION PANEL
ALBUMIN: 3.8 g/dL (ref 3.5–5.2)
ALT: 8 U/L (ref 0–35)
AST: 15 U/L (ref 0–37)
Alkaline Phosphatase: 89 U/L (ref 39–117)
Bilirubin, Direct: 0.1 mg/dL (ref 0.0–0.3)
TOTAL PROTEIN: 7.7 g/dL (ref 6.0–8.3)
Total Bilirubin: 0.3 mg/dL (ref 0.2–1.2)

## 2016-09-27 MED ORDER — CETIRIZINE HCL 10 MG PO TABS: 10.0000 mg | ORAL_TABLET | Freq: Every day | ORAL | 11 refills | Status: DC

## 2016-09-27 MED ORDER — GUAIFENESIN-CODEINE 100-10 MG/5ML PO SYRP: 10.0000 mL | ORAL_SOLUTION | Freq: Three times a day (TID) | ORAL | 0 refills | Status: DC | PRN

## 2016-09-27 NOTE — Progress Notes (Signed)
   Subjective:    Patient ID: Krystal Harris, female    DOB: 12-24-1985, 30 y.o.   MRN: [id number]  HPI Fatty liver- pt was called to enter a study for fatty liver but she was unaware that she had this.  Pt saw Dr Collene Mares last fall for gallbladder issue and at that time, she had CT done that was WNL.  US done 07/17/15 noted fatty liver but CT done 07/16/15 does not comment on this.  All labs that I have for review are WNL.  Pt has not had abd pain since removal of gallbladder Oct 2016.    URI- sxs started Sunday w/ sore throat.  Developed laryngitis on Monday and stayed home from work b/c 'i felt awful'.  + cough- intermittently productive but barking.  Tm 100.3 yesterday.  No sinus pain/pressure.  No ear pain.    Review of Systems For ROS see HPI     Objective:   Physical Exam  Constitutional: She is oriented to person, place, and time. She appears well-developed and well-nourished. No distress.  HENT:  Head: Normocephalic and atraumatic.  Right Ear: Tympanic membrane normal.  Left Ear: Tympanic membrane normal.  Nose: Mucosal edema and rhinorrhea present. Right sinus exhibits no maxillary sinus tenderness and no frontal sinus tenderness. Left sinus exhibits no maxillary sinus tenderness and no frontal sinus tenderness.  Mouth/Throat: Mucous membranes are normal. Posterior oropharyngeal erythema (w/ PND) present.  Eyes: Conjunctivae and EOM are normal. Pupils are equal, round, and reactive to light.  Neck: Normal range of motion. Neck supple.  Cardiovascular: Normal rate, regular rhythm and normal heart sounds.   Pulmonary/Chest: Effort normal and breath sounds normal. No respiratory distress. She has no wheezes. She has no rales.  Abdominal: Soft. Bowel sounds are normal. She exhibits no distension. There is no tenderness. There is no rebound.  Lymphadenopathy:    She has no cervical adenopathy.  Neurological: She is alert and oriented to person, place, and time.  Skin: Skin is warm and  dry.  Psychiatric: She has a normal mood and affect. Her behavior is normal. Thought content normal.  Vitals reviewed.         Assessment & Plan:

## 2016-09-27 NOTE — Progress Notes (Signed)
Pre visit review using our clinic review tool, if applicable. No additional management support is needed unless otherwise documented below in the visit note. 

## 2016-09-27 NOTE — Patient Instructions (Signed)
Schedule your complete physical in 4-6 months We'll notify you of your lab results and make any changes if needed Continue to work on low carb diet and get regular exercise to improve fatty liver if present Start the Zyrtec daily for post-nasal drip Mucinex DM for daytime cough, syrup for nights/weekends- will cause drowsiness Drink plenty of fluids REST! Call with any questions or concerns Happy Holidays!!!

## 2016-09-27 NOTE — Assessment & Plan Note (Signed)
New.  Noted on [place] but not on CT from fall 2016.  LFTs have been WNL.  Repeat labs today.  Stressed need for healthy diet and regular exercise to improve fatty liver.  Pt expressed understanding and is in agreement w/ plan.

## 2016-09-27 NOTE — Assessment & Plan Note (Signed)
Pt's sxs and PE consistent w/ viral illness.  No evidence of bacterial infxn so no need for abx.  Cough meds prn.  Reviewed supportive care and red flags that should prompt return.  Pt expressed understanding and is in agreement w/ plan.

## 2016-09-28 ENCOUNTER — Ambulatory Visit: Payer: BC Managed Care – PPO | Admitting: Family Medicine

## 2016-11-29 ENCOUNTER — Ambulatory Visit: Payer: BC Managed Care – PPO | Admitting: Family Medicine

## 2016-11-30 ENCOUNTER — Ambulatory Visit (HOSPITAL_BASED_OUTPATIENT_CLINIC_OR_DEPARTMENT_OTHER): Payer: BC Managed Care – PPO

## 2016-11-30 ENCOUNTER — Other Ambulatory Visit: Payer: Self-pay | Admitting: *Deleted

## 2016-11-30 ENCOUNTER — Encounter: Payer: Self-pay | Admitting: Physician Assistant

## 2016-11-30 ENCOUNTER — Ambulatory Visit (HOSPITAL_BASED_OUTPATIENT_CLINIC_OR_DEPARTMENT_OTHER)
Admission: RE | Admit: 2016-11-30 | Discharge: 2016-11-30 | Disposition: A | Discharge status: Home / Self Care | Payer: BC Managed Care – PPO | Source: Ambulatory Visit | Attending: Physician Assistant | Admitting: Physician Assistant

## 2016-11-30 ENCOUNTER — Ambulatory Visit (INDEPENDENT_AMBULATORY_CARE_PROVIDER_SITE_OTHER): Payer: BC Managed Care – PPO | Admitting: Physician Assistant

## 2016-11-30 ENCOUNTER — Encounter (HOSPITAL_BASED_OUTPATIENT_CLINIC_OR_DEPARTMENT_OTHER): Payer: Self-pay

## 2016-11-30 ENCOUNTER — Other Ambulatory Visit: Payer: Self-pay | Admitting: Physician Assistant

## 2016-11-30 ENCOUNTER — Telehealth: Payer: Self-pay | Admitting: *Deleted

## 2016-11-30 VITALS — BP 122/84 | HR 89 | Temp 98.5°F | Resp 14 | Ht 64.0 in | Wt 275.0 lb

## 2016-11-30 DIAGNOSIS — R2231 Localized swelling, mass and lump, right upper limb: Secondary | ICD-10-CM

## 2016-11-30 NOTE — Patient Instructions (Signed)
Please go to the [facility] at [address] for your ultrasound. It is at 10:30. I will call you with your results. We will determine treatment based on that.   If you note any new or worsening symptoms, please let me know.

## 2016-11-30 NOTE — Progress Notes (Signed)
Pre visit review using our clinic review tool, if applicable. No additional management support is needed unless otherwise documented below in the visit note. 

## 2016-11-30 NOTE — Telephone Encounter (Signed)
The imaging center at [place] called because patient came in for the check of the lump under her arm, and they are not able to ultrasound that are because it is with breast tissue. It was cleared with [facility] for them to send patient over to the breast center for an ultrasound of the right breast with axillary.   Orders were placed and the imaging center was going to handle getting her in today to have it done.     They asked that I complete a phone note routed to Elyn Aquas since he was the ordering provider, just so he would know what was going on.

## 2016-11-30 NOTE — Telephone Encounter (Signed)
Noted! Thank you

## 2016-11-30 NOTE — Progress Notes (Signed)
Patient presents to clinic today c/o a lump in R axillary region present x 1 month. Denies redness or warmth at site. Some mild tenderness intermittently. Was seen by GYN 2 weeks ago. Felt could be infective and was started on Bactrim. Took as directed without any change in symptoms. GYN recommended [place] for further assessment. States she has not been schedule yet and is anxious about the lump. Wants to see if we can help expedite her [place]. Patient denies fever, chills, malaise or fatigue.   Past Medical History:  Diagnosis Date  . Chicken pox   . Kidney stones   . Vitamin D deficiency     Current Outpatient Prescriptions on File Prior to Visit  Medication Sig Dispense Refill  . cetirizine (ZYRTEC) 10 MG tablet Take 1 tablet (10 mg total) by mouth daily. 30 tablet 11  . omeprazole (PRILOSEC) 40 MG capsule Take 1 capsule (40 mg total) by mouth daily. 30 capsule 3  . sucralfate (CARAFATE) 1 G tablet Take 1 tablet (1 g total) by mouth 4 (four) times daily -  with meals and at bedtime. 90 tablet 0   No current facility-administered medications on file prior to visit.     Allergies  Allergen Reactions  . Penicillins Hives and Nausea And Vomiting    Has patient had a PCN reaction causing immediate rash, facial/tongue/throat swelling, SOB or lightheadedness with hypotension: no Has patient had a PCN reaction causing severe rash involving mucus membranes or skin necrosis: unknown Has patient had a PCN reaction that required hospitalization unknown Has patient had a PCN reaction occurring within the last 10 years: unknown If all of the above answers are "NO", then may proceed with Cephalosporin use.     Family History  Problem Relation Age of Onset  . Diabetes Mother   . Hypertension Mother   . Stroke Maternal Grandmother   . Cancer Maternal Grandfather     lung  . Diabetes Maternal Grandfather   . Stroke Maternal Grandfather   . Hypertension Maternal Grandfather     Social History    Social History  . Marital status: Single    Spouse name: N/A  . Number of children: N/A  . Years of education: N/A   Social History Main Topics  . Smoking status: Never Smoker  . Smokeless tobacco: Never Used  . Alcohol use Yes     Comment: occasionally  . Drug use: No  . Sexual activity: Not Asked   Other Topics Concern  . None   Social History Narrative  . None   Review of Systems - See HPI.  All other ROS are negative.  BP 122/84   Pulse 89   Temp 98.5 F (36.9 C) (Oral)   Resp 14   Ht 5\' 4"  (1.626 m)   Wt 275 lb (124.7 kg)   SpO2 99%   BMI 47.20 kg/m   Physical Exam  Constitutional: She is oriented to person, place, and time and well-developed, well-nourished, and in no distress.  HENT:  Head: Normocephalic and atraumatic.  Cardiovascular: Normal rate, regular rhythm, normal heart sounds and intact distal pulses.   Pulmonary/Chest: Effort normal and breath sounds normal. No respiratory distress. She has no wheezes. She has no rales. She exhibits no tenderness.  Lymphadenopathy:       Head (right side): No submental, no submandibular, no tonsillar, no preauricular, no posterior auricular and no occipital adenopathy present.       Head (left side): No submental, no submandibular,  no tonsillar, no preauricular, no posterior auricular and no occipital adenopathy present.    She has no cervical adenopathy.       Right cervical: No superficial cervical, no deep cervical and no posterior cervical adenopathy present.      Left cervical: No superficial cervical, no deep cervical and no posterior cervical adenopathy present.       Right axillary: No pectoral and no lateral adenopathy present.       Right: No supraclavicular adenopathy present.       Left: No supraclavicular adenopathy present.  Neurological: She is alert and oriented to person, place, and time.  Skin: Skin is warm and dry. No rash noted.     Psychiatric: Affect normal.  Vitals reviewed.   Recent  Results (from the past 2160 hour(s))  Hepatic function panel     Status: None   Collection Time: 09/27/16  9:42 AM  Result Value Ref Range   Total Bilirubin 0.3 0.2 - 1.2 mg/dL   Bilirubin, Direct 0.1 0.0 - 0.3 mg/dL   Alkaline Phosphatase 89 39 - 117 U/L   AST 15 0 - 37 U/L   ALT 8 0 - 35 U/L   Total Protein 7.7 6.0 - 8.3 g/dL   Albumin 3.8 3.5 - 5.2 g/dL  CBC with Differential/Platelet     Status: Abnormal   Collection Time: 09/27/16  9:42 AM  Result Value Ref Range   WBC 7.5 4.0 - 10.5 K/uL   RBC 4.37 3.87 - 5.11 Mil/uL   Hemoglobin 11.8 (L) 12.0 - 15.0 g/dL   HCT 36.3 36.0 - 46.0 %   MCV 83.0 78.0 - 100.0 fl   MCHC 32.5 30.0 - 36.0 g/dL   RDW 15.2 11.5 - 15.5 %   Platelets 355.0 150.0 - 400.0 K/uL   Neutrophils Relative % 70.8 43.0 - 77.0 %   Lymphocytes Relative 15.5 12.0 - 46.0 %   Monocytes Relative 6.6 3.0 - 12.0 %   Eosinophils Relative 6.6 (H) 0.0 - 5.0 %   Basophils Relative 0.5 0.0 - 3.0 %   Neutro Abs 5.3 1.4 - 7.7 K/uL   Lymphs Abs 1.2 0.7 - 4.0 K/uL   Monocytes Absolute 0.5 0.1 - 1.0 K/uL   Eosinophils Absolute 0.5 0.0 - 0.7 K/uL   Basophils Absolute 0.0 0.0 - 0.1 K/uL   Assessment/Plan: 1. Axillary mass, right Feels too firm for lipoma. Potential cyst. No cervical, occipital lymphadenopathy. No sign of active infection. Will obtain US today to assess. Will proceed with management after [place] assessment.   - Ambulatory referral to General Surgery - [place] RT UPPER EXTREM LTD SOFT TISSUE NON VASCULAR; Future   Leeanne Rio, PA-C

## 2016-12-01 ENCOUNTER — Ambulatory Visit
Admission: RE | Admit: 2016-12-01 | Discharge: 2016-12-01 | Disposition: A | Discharge status: Home / Self Care | Payer: BC Managed Care – PPO | Source: Ambulatory Visit | Attending: Family Medicine | Admitting: Family Medicine

## 2016-12-01 DIAGNOSIS — R2231 Localized swelling, mass and lump, right upper limb: Secondary | ICD-10-CM

## 2016-12-04 ENCOUNTER — Telehealth: Payer: Self-pay | Admitting: Family Medicine

## 2016-12-04 ENCOUNTER — Emergency Department (HOSPITAL_COMMUNITY): Payer: BC Managed Care – PPO

## 2016-12-04 ENCOUNTER — Emergency Department (HOSPITAL_COMMUNITY)
Admission: EM | Admit: 2016-12-04 | Discharge: 2016-12-04 | Disposition: A | Discharge status: Home / Self Care | Payer: BC Managed Care – PPO | Attending: Emergency Medicine | Admitting: Emergency Medicine

## 2016-12-04 ENCOUNTER — Encounter (HOSPITAL_COMMUNITY): Payer: Self-pay

## 2016-12-04 DIAGNOSIS — Y998 Other external cause status: Secondary | ICD-10-CM | POA: Insufficient documentation

## 2016-12-04 DIAGNOSIS — M545 Low back pain, unspecified: Secondary | ICD-10-CM

## 2016-12-04 DIAGNOSIS — Z79899 Other long term (current) drug therapy: Secondary | ICD-10-CM | POA: Insufficient documentation

## 2016-12-04 DIAGNOSIS — Y9351 Activity, roller skating (inline) and skateboarding: Secondary | ICD-10-CM | POA: Insufficient documentation

## 2016-12-04 DIAGNOSIS — Y929 Unspecified place or not applicable: Secondary | ICD-10-CM | POA: Insufficient documentation

## 2016-12-04 LAB — POC URINE PREG, ED: Preg Test, Ur: NEGATIVE

## 2016-12-04 MED ORDER — CYCLOBENZAPRINE HCL 10 MG PO TABS: 10.0000 mg | ORAL_TABLET | Freq: Two times a day (BID) | ORAL | 0 refills | Status: DC | PRN

## 2016-12-04 NOTE — ED Triage Notes (Addendum)
PT C/O LOWER BACK PAIN AFTER FALLING OFF OF A HOVER BOARD ON Saturday. PT DENIES HEAD INJURY, LOC, NUMBNESS, OR URINARY SYMPTOMS. PT AMBULATORY IN TRIAGE.

## 2016-12-04 NOTE — Discharge Instructions (Signed)
Please read attached information. If you experience any new or worsening signs or symptoms please return to the emergency room for evaluation. Please follow-up with your primary care provider or specialist as discussed. Please use medication prescribed only as directed and discontinue taking if you have any concerning signs or symptoms.   °

## 2016-12-04 NOTE — Telephone Encounter (Signed)
Patient Name: [name] DOB: 1985/11/11 Initial Comment Caller states fell off hoverboard and hit back and head; can barely walk; Nurse Assessment Nurse: Zorita Pang, RN, Deborah Date/Time (Eastern Time): 12/04/2016 2:17:49 PM Confirm and document reason for call. If symptomatic, describe symptoms. ---The caller states that she fell on a hoverboard on Saturday and she hit her head and back. She states that she got some of the hot patches on her lower back. 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 Does the patient have any new or worsening symptoms? ---Yes Will a triage be completed? ---Yes Related visit to physician within the last 2 weeks? ---Yes Does the PT have any chronic conditions? (i.e. diabetes, asthma, etc.) ---No Is the patient pregnant or possibly pregnant? (Ask all females between the ages of 43-55) ---No Is this a behavioral health or substance abuse call? ---No Guidelines Guideline Title Affirmed Question Affirmed Notes Back Injury [1] SEVERE PAIN in kidney area (flank) AND [2] follows direct blow to that site Final Disposition User Go to ED Now Zorita Pang, RN, Neoma Laming Comments The caller states that she fell off of a hoverboard on Saturday evening and she has been having pain in the flank area to the lower back since. She states that it would be a 7 on the 1-10 pain scale. 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 She stated that she plans to go to [facility]. This nurse advised the caller to call before she goes to determine whether they have x-ray capability and the caller verbalizes understanding. Referrals [facility] Disagree/Comply: Comply

## 2016-12-04 NOTE — ED Notes (Signed)
Patient was alert, oriented and stable upon discharge. RN went over AVS and patient had no further questions.  Patient was advised to avoid driving while taking prescribed muscle relaxers.

## 2016-12-04 NOTE — ED Provider Notes (Signed)
Care assumed from [facility], PA-C, at shift change. Pt here with lower back pain after a fall, please see previous provider's notes for full HPI/history. Results so far show Upreg neg. Awaiting Lumbar spine xray. Plan is to d/c if xray is negative, as pt has been told that if the results were unremarkable then she would just be discharged with Jeff's discharge instructions that they agreed upon.    Results for orders placed or performed during the hospital encounter of 12/04/16  POC Urine Pregnancy, ED (do NOT order at [facility])  Result Value Ref Range   Preg Test, Ur NEGATIVE NEGATIVE   Dg Lumbar Spine Complete  Result Date: 12/04/2016 CLINICAL DATA:  Low back pain, right worse than left, since a fall from a hoverboard 2 days ago. EXAM: LUMBAR SPINE - COMPLETE 4+ VIEW COMPARISON:  None. FINDINGS: There is no evidence of lumbar spine fracture. Alignment is normal. Intervertebral disc spaces are maintained. IMPRESSION: Normal exam. Electronically Signed   By: Inge Rise M.D.   On: 12/04/2016 20:27   [place] Limited Joint Space Structures Up Right  Result Date: 12/01/2016 CLINICAL DATA:  31 year old female with a palpable abnormality in her right axilla. The patient states this is sometimes painful and sometimes changes in size. She has applied warm compresses to this area as well as tried antibiotics, however a persistent palpable mass remains. EXAM: ULTRASOUND OF THE RIGHT AXILLA COMPARISON:  None. FINDINGS: Physical examination of the right axilla reveals a firm nodular area involving the skin. Targeted ultrasound of the right axilla at site of palpable concern was performed demonstrating a heterogeneous collection just beneath the skin surface measuring approximately 2.3 x 0.5 x 3.7 cm. A small thin tract is seen extending to the skin with findings most suggestive of a sebaceous cyst. IMPRESSION: Sebaceous cyst in the right axilla measuring up to 3.7 cm. RECOMMENDATION: The patient states she has an  appointment within a surgeon next week for further management. If desired, ultrasound-guided cyst drainage could be attempted. I have discussed the findings and recommendations with the patient. Results were also provided in writing at the conclusion of the visit. If applicable, a reminder letter will be sent to the patient regarding the next appointment. BI-RADS CATEGORY  2: Benign. Electronically Signed   By: Everlean Alstrom M.D.   On: 12/01/2016 12:43     8:34 PM Xray negative. D/c pt with previously outlined plan per Lenn Sink' notes. He explained the diagnosis and have given explicit precautions to return to the ER including for any other new or worsening symptoms. Discharge instructions concerning home care and prescriptions have been given. The patient is STABLE and is discharged to home in good condition.    [address] M[address]onagh, PA-C 12/04/16 2034    Leo Grosser, MD 12/05/16 [phone]

## 2016-12-04 NOTE — Telephone Encounter (Signed)
Per chart, pt went to the ER as originally advised.

## 2016-12-04 NOTE — ED Provider Notes (Signed)
[place] DEPT Provider Note   CSN: [id number] Arrival date & time: 12/04/16  1701  By signing my name below, I, Sonum Patel, attest that this documentation has been prepared under the direction and in the presence of [employer], PA-C. Electronically Signed: Sonum Patel, Education administrator. 12/04/16. 8:16 PM.  History   Chief Complaint Chief Complaint  Patient presents with  . Fall  . Back Pain    The history is provided by the patient. No language interpreter was used.     HPI Comments:  Krystal Harris is a 31 y.o. female who presents to the Emergency Department complaining of constant, unchanged lower back pain that began after falling off of a hover board 2 days ago. She states she fell backwards landing on her buttocks and lower back. She denies head injury or LOC. She tried a leftover Percocet without relief. She denies numbness, weakness, paresthesia, incontinence.   Past Medical History:  Diagnosis Date  . Chicken pox   . Kidney stones   . Vitamin D deficiency     Patient Active Problem List   Diagnosis Date Noted  . Fatty liver 09/27/2016  . URI (upper respiratory infection) 09/27/2016  . Numbness and tingling in right hand 03/18/2016  . Left sided abdominal pain 07/14/2015  . Routine general medical examination at a health care facility 02/21/2013  . Low back pain 02/21/2013  . Left ankle swelling 02/21/2013    Past Surgical History:  Procedure Laterality Date  . FOREIGN BODY REMOVAL Right 09/17/2013   Procedure: REMOVAL FOREIGN BODY NEEDLE PLANTAR ASPECT OF THE FOOT;  Surgeon: Newt Minion, MD;  Location: [place];  Service: Orthopedics;  Laterality: Right;  Remove Foreign Body Right Foot  . WISDOM TOOTH EXTRACTION      OB History    No data available       Home Medications    Prior to Admission medications   Medication Sig Start Date End Date Taking? Authorizing Provider  cetirizine (ZYRTEC) 10 MG tablet Take 1 tablet (10 mg total) by mouth daily.  09/27/16   [name], MD  cyclobenzaprine (FLEXERIL) 10 MG tablet Take 1 tablet (10 mg total) by mouth 2 (two) times daily as needed for muscle spasms. 12/04/16   Okey Regal, PA-C  omeprazole (PRILOSEC) 40 MG capsule Take 1 capsule (40 mg total) by mouth daily. 07/14/15   [name], MD  sucralfate (CARAFATE) 1 G tablet Take 1 tablet (1 g total) by mouth 4 (four) times daily -  with meals and at bedtime. 07/14/15   [name], MD    Family History Family History  Problem Relation Age of Onset  . Diabetes Mother   . Hypertension Mother   . Stroke Maternal Grandmother   . Cancer Maternal Grandfather     lung  . Diabetes Maternal Grandfather   . Stroke Maternal Grandfather   . Hypertension Maternal Grandfather     Social History Social History  Substance Use Topics  . Smoking status: Never Smoker  . Smokeless tobacco: Never Used  . Alcohol use Yes     Comment: occasionally     Allergies   Penicillins   Review of Systems Review of Systems  Musculoskeletal: Positive for back pain.  Neurological: Negative for syncope, weakness and numbness.     Physical Exam Updated Vital Signs BP (!) 136/105 (BP Location: Left Arm)   Pulse 100   Temp 98.7 F (37.1 C) (Oral)   Resp 16   Ht  5\' 4"  (1.626 m)   Wt 126.6 kg   LMP 11/26/2016   SpO2 100%   BMI 47.90 kg/m   Physical Exam  Constitutional: She is oriented to person, place, and time. She appears well-developed and well-nourished.  HENT:  Head: Normocephalic and atraumatic.  Cardiovascular: Normal rate.   Pulmonary/Chest: Effort normal.  Musculoskeletal:  Patient has no CT or L-spine tenderness. Distal sensation strength and motor function intact  Neurological: She is alert and oriented to person, place, and time.  Skin: Skin is warm and dry.  Psychiatric: She has a normal mood and affect.  Nursing note and vitals reviewed.    ED Treatments / Results  DIAGNOSTIC STUDIES: Oxygen Saturation  is 100% on RA, normal by my interpretation.    COORDINATION OF CARE: 6:44 PM Discussed treatment plan with pt at bedside and pt agreed to plan.   Labs (all labs ordered are listed, but only abnormal results are displayed) Labs Reviewed  POC URINE PREG, ED    EKG  EKG Interpretation None       Radiology No results found.  Procedures Procedures (including critical care time)  Medications Ordered in ED Medications - No data to display   Initial Impression / Assessment and Plan / ED Course  I have reviewed the triage vital signs and the nursing notes.  Pertinent labs & imaging results that were available during my care of the patient were reviewed by me and considered in my medical decision making (see chart for details).      Final Clinical Impressions(s) / ED Diagnoses   Final diagnoses:  Acute bilateral low back pain without sciatica    Labs: POC urine preg   Imaging: DG lumbar   Consults:  Therapeutics:  Discharge Meds:   Assessment/Plan: 31 year old female presents status post fall. She has no tenderness upon evaluation. Patient complaining of back pain and need for complete evaluation to rule out any significant causes of back pain. She has no neurological deficits, no red flags for back pain. She will have plain films of her lumbar spine, anticipate no significant findings. She will be discharged home on muscle relaxers, orthopedic follow-up.     New Prescriptions New Prescriptions   CYCLOBENZAPRINE (FLEXERIL) 10 MG TABLET    Take 1 tablet (10 mg total) by mouth 2 (two) times daily as needed for muscle spasms.    I personally performed the services described in this documentation, which was scribed in my presence. The recorded information has been reviewed and is accurate.   Okey Regal, PA-C 12/04/16 2016    Leo Grosser, MD 12/05/16 [phone]

## 2016-12-08 ENCOUNTER — Ambulatory Visit: Payer: Self-pay | Admitting: General Surgery

## 2016-12-08 NOTE — H&P (Signed)
History of Present Illness (Ruvim Risko MD; 12/08/2016 10:51 AM) The patient is a 30 year old female who presents with a complaint of cyst. 22-year-old female who is referred by Dr. Mann for evaluation of a right axillary cyst. Patient states she's had pain and noticed increase in size. Patient ultrasound which revealed: IMPRESSION: Sebaceous cyst in the right axilla measuring up to 3.7 cm.  Patient states that she's tried warm compresses over this does not help. Patient was also given a trial of Bactrim which did not improve.   Allergies (Christen Lambert, RMA; 12/08/2016 10:31 AM) Penicillin G Pot in Dextrose *PENICILLINS*   Medication History (Christen Lambert, RMA; 12/08/2016 10:32 AM) Flexeril (10MG Tablet, Oral) Active. Omeprazole (40MG Capsule ER, Oral) Active. Medications Reconciled  Vitals (Christen Lambert RMA; 12/08/2016 10:33 AM) 12/08/2016 10:32 AM Weight: 274.4 lb Height: 64in Body Surface Area: 2.24 m Body Mass Index: 47.1 kg/m  Temp.: 98.6F  Pulse: 68 (Regular)  BP: 130/90 (Sitting, Left Arm, Standard)       Physical Exam (Maxie Slovacek MD; 12/08/2016 10:51 AM) General Mental Status-Alert. General Appearance-Consistent with stated age. Hydration-Well hydrated. Voice-Normal.  Integumentary Note: Right axillary cyst approximately 2 x 3 cm, subcutaneous, noninflamed, no drainage   Cardiovascular Cardiovascular examination reveals -normal heart sounds, regular rate and rhythm with no murmurs and normal pedal pulses bilaterally.  Abdomen Inspection Inspection of the abdomen reveals - No Hernias. Skin - Scar - no surgical scars. Palpation/Percussion Palpation and Percussion of the abdomen reveal - Soft, Non Tender, No Rebound tenderness, No Rigidity (guarding) and No hepatosplenomegaly. Auscultation Auscultation of the abdomen reveals - Bowel sounds normal.  Neurologic Neurologic evaluation reveals -alert and oriented x 3  with no impairment of recent or remote memory. Mental Status-Normal.    Assessment & Plan (Taron Mondor MD; 12/08/2016 10:52 AM) SEBACEOUS CYST OF RIGHT AXILLA (L72.3) Impression: 30-year-old female with a right sebaceous cyst 1. Will proceed. In for excision of cyst 2. I discussed with the patient the risks and benefits the procedure to include but not limited to: Infection, bleeding, damage to structures, possible recurrence, possible wound healing issues. Patient was understanding and wished to proceed. 

## 2016-12-20 NOTE — Patient Instructions (Signed)
Krystal Harris  12/20/2016   Your procedure is scheduled on: 12/29/16  Report to [facility] Main  Entrance take [place]  elevators to 3rd floor to  [place] at   [phone].  Call this number if you have problems the morning of surgery [phone]   Remember: ONLY 1 PERSON MAY GO WITH YOU TO SHORT STAY TO GET  READY MORNING OF YOUR SURGERY.  Do not eat food or drink liquids :After Midnight.     Take these medicines the morning of surgery with A SIP OF WATER: PRILOSEC IF NEEDED                                You may not have any metal on your body including hair pins and              piercings  Do not wear jewelry, make-up, lotions, powders or perfumes, deodorant             Do not wear nail polish.  Do not shave  48 hours prior to surgery.              Men may shave face and neck.   Do not bring valuables to the hospital. [place].  Contacts, dentures or bridgework may not be worn into surgery.      Patients discharged the day of surgery will not be allowed to drive home.  Name and phone number of your driver:  Special Instructions: N/A              Please read over the following fact sheets you were given: _____________________________________________________________________             [facility] - Preparing for Surgery Before surgery, you can play an important role.  Because skin is not sterile, your skin needs to be as free of germs as possible.  You can reduce the number of germs on your skin by washing with CHG (chlorahexidine gluconate) soap before surgery.  CHG is an antiseptic cleaner which kills germs and bonds with the skin to continue killing germs even after washing. Please DO NOT use if you have an allergy to CHG or antibacterial soaps.  If your skin becomes reddened/irritated stop using the CHG and inform your nurse when you arrive at Short Stay. Do not shave (including legs and  underarms) for at least 48 hours prior to the first CHG shower.  You may shave your face/neck. Please follow these instructions carefully:  1.  Shower with CHG Soap the night before surgery and the  morning of Surgery.  2.  If you choose to wash your hair, wash your hair first as usual with your  normal  shampoo.  3.  After you shampoo, rinse your hair and body thoroughly to remove the  shampoo.                           4.  Use CHG as you would any other liquid soap.  You can apply chg directly  to the skin and wash  Gently with a scrungie or clean washcloth.  5.  Apply the CHG Soap to your body ONLY FROM THE NECK DOWN.   Do not use on face/ open                           Wound or open sores. Avoid contact with eyes, ears mouth and genitals (private parts).                       Wash face,  Genitals (private parts) with your normal soap.             6.  Wash thoroughly, paying special attention to the area where your surgery  will be performed.  7.  Thoroughly rinse your body with warm water from the neck down.  8.  DO NOT shower/wash with your normal soap after using and rinsing off  the CHG Soap.                9.  Pat yourself dry with a clean towel.            10.  Wear clean pajamas.            11.  Place clean sheets on your bed the night of your first shower and do not  sleep with pets. Day of Surgery : Do not apply any lotions/deodorants the morning of surgery.  Please wear clean clothes to the hospital/surgery center.  FAILURE TO FOLLOW THESE INSTRUCTIONS MAY RESULT IN THE CANCELLATION OF YOUR SURGERY PATIENT SIGNATURE_________________________________  NURSE SIGNATURE__________________________________  ________________________________________________________________________

## 2016-12-25 ENCOUNTER — Encounter (HOSPITAL_COMMUNITY): Payer: Self-pay

## 2016-12-25 ENCOUNTER — Encounter (HOSPITAL_COMMUNITY)
Admission: RE | Admit: 2016-12-25 | Discharge: 2016-12-25 | Disposition: A | Discharge status: Home / Self Care | Payer: BC Managed Care – PPO | Source: Ambulatory Visit | Attending: General Surgery | Admitting: General Surgery

## 2016-12-25 DIAGNOSIS — Z01818 Encounter for other preprocedural examination: Secondary | ICD-10-CM | POA: Diagnosis not present

## 2016-12-25 DIAGNOSIS — R229 Localized swelling, mass and lump, unspecified: Secondary | ICD-10-CM | POA: Diagnosis not present

## 2016-12-25 HISTORY — DX: Gastro-esophageal reflux disease without esophagitis: K21.9

## 2016-12-25 HISTORY — DX: Other specified postprocedural states: R11.2

## 2016-12-25 HISTORY — DX: Nausea with vomiting, unspecified: Z98.890

## 2016-12-25 LAB — CBC
HCT: 34.8 % — ABNORMAL LOW (ref 36.0–46.0)
HEMOGLOBIN: 11.2 g/dL — AB (ref 12.0–15.0)
MCH: 26.7 pg (ref 26.0–34.0)
MCHC: 32.2 g/dL (ref 30.0–36.0)
MCV: 82.9 fL (ref 78.0–100.0)
Platelets: 369 10*3/uL (ref 150–400)
RBC: 4.2 MIL/uL (ref 3.87–5.11)
RDW: 14.4 % (ref 11.5–15.5)
WBC: 8 10*3/uL (ref 4.0–10.5)

## 2016-12-25 LAB — HCG, SERUM, QUALITATIVE: PREG SERUM: NEGATIVE

## 2016-12-25 NOTE — Progress Notes (Signed)
CXR 01/10/16 epic

## 2016-12-28 MED ORDER — VANCOMYCIN HCL 10 G IV SOLR
1500.0000 mg | INTRAVENOUS | Status: AC
  Administered 2016-12-29: 1500 mg via INTRAVENOUS
  Filled 2016-12-28: qty 1500

## 2016-12-29 ENCOUNTER — Ambulatory Visit (HOSPITAL_COMMUNITY): Payer: BC Managed Care – PPO | Admitting: Anesthesiology

## 2016-12-29 ENCOUNTER — Encounter (HOSPITAL_COMMUNITY)
Admission: RE | Disposition: A | Discharge status: Home / Self Care | Payer: Self-pay | Source: Ambulatory Visit | Attending: General Surgery

## 2016-12-29 ENCOUNTER — Ambulatory Visit (HOSPITAL_COMMUNITY)
Admission: RE | Admit: 2016-12-29 | Discharge: 2016-12-29 | Disposition: A | Discharge status: Home / Self Care | Payer: BC Managed Care – PPO | Source: Ambulatory Visit | Attending: General Surgery | Admitting: General Surgery

## 2016-12-29 ENCOUNTER — Encounter (HOSPITAL_COMMUNITY): Payer: Self-pay | Admitting: *Deleted

## 2016-12-29 DIAGNOSIS — Z6841 Body Mass Index (BMI) 40.0 and over, adult: Secondary | ICD-10-CM | POA: Insufficient documentation

## 2016-12-29 DIAGNOSIS — K219 Gastro-esophageal reflux disease without esophagitis: Secondary | ICD-10-CM | POA: Insufficient documentation

## 2016-12-29 DIAGNOSIS — L723 Sebaceous cyst: Secondary | ICD-10-CM | POA: Diagnosis present

## 2016-12-29 DIAGNOSIS — L720 Epidermal cyst: Secondary | ICD-10-CM | POA: Diagnosis not present

## 2016-12-29 HISTORY — PX: CYST EXCISION: SHX5701

## 2016-12-29 SURGERY — CYST REMOVAL
Anesthesia: General | Site: Axilla | Laterality: Right

## 2016-12-29 MED ORDER — ONDANSETRON HCL 4 MG/2ML IJ SOLN
INTRAMUSCULAR | Status: AC
  Filled 2016-12-29: qty 2

## 2016-12-29 MED ORDER — HYDROMORPHONE HCL 2 MG/ML IJ SOLN
INTRAMUSCULAR | Status: AC
  Filled 2016-12-29: qty 1

## 2016-12-29 MED ORDER — MORPHINE SULFATE (PF) 4 MG/ML IV SOLN: 2.0000 mg | INTRAVENOUS | Status: DC | PRN

## 2016-12-29 MED ORDER — SODIUM CHLORIDE 0.9% FLUSH: 3.0000 mL | INTRAVENOUS | Status: DC | PRN

## 2016-12-29 MED ORDER — ACETAMINOPHEN 650 MG RE SUPP
650.0000 mg | RECTAL | Status: DC | PRN
  Filled 2016-12-29: qty 1

## 2016-12-29 MED ORDER — PROPOFOL 10 MG/ML IV BOLUS
INTRAVENOUS | Status: DC | PRN
  Administered 2016-12-29: 200 mg via INTRAVENOUS

## 2016-12-29 MED ORDER — HYDROMORPHONE HCL 1 MG/ML IJ SOLN
0.2500 mg | INTRAMUSCULAR | Status: DC | PRN
  Administered 2016-12-29: 0.5 mg via INTRAVENOUS
  Administered 2016-12-29 (×4): 0.25 mg via INTRAVENOUS
  Administered 2016-12-29: 0.5 mg via INTRAVENOUS

## 2016-12-29 MED ORDER — SODIUM CHLORIDE 0.9 % IV SOLN: 250.0000 mL | INTRAVENOUS | Status: DC | PRN

## 2016-12-29 MED ORDER — OXYCODONE HCL 5 MG PO TABS
5.0000 mg | ORAL_TABLET | ORAL | Status: DC | PRN
Start: 2016-12-29 — End: 2016-12-29
  Administered 2016-12-29: 10 mg via ORAL
  Filled 2016-12-29: qty 2

## 2016-12-29 MED ORDER — MIDAZOLAM HCL 2 MG/2ML IJ SOLN
INTRAMUSCULAR | Status: AC
  Filled 2016-12-29: qty 2

## 2016-12-29 MED ORDER — LACTATED RINGERS IV SOLN
INTRAVENOUS | Status: DC
  Administered 2016-12-29: 1000 mL via INTRAVENOUS

## 2016-12-29 MED ORDER — SODIUM CHLORIDE 0.9% FLUSH: 3.0000 mL | Freq: Two times a day (BID) | INTRAVENOUS | Status: DC

## 2016-12-29 MED ORDER — FENTANYL CITRATE (PF) 100 MCG/2ML IJ SOLN
INTRAMUSCULAR | Status: DC | PRN
  Administered 2016-12-29: 50 ug via INTRAVENOUS
  Administered 2016-12-29 (×2): 25 ug via INTRAVENOUS

## 2016-12-29 MED ORDER — ACETAMINOPHEN 325 MG PO TABS: 650.0000 mg | ORAL_TABLET | ORAL | Status: DC | PRN

## 2016-12-29 MED ORDER — MEPERIDINE HCL 50 MG/ML IJ SOLN: 6.2500 mg | INTRAMUSCULAR | Status: DC | PRN

## 2016-12-29 MED ORDER — FENTANYL CITRATE (PF) 100 MCG/2ML IJ SOLN
INTRAMUSCULAR | Status: AC
  Filled 2016-12-29: qty 2

## 2016-12-29 MED ORDER — ONDANSETRON HCL 4 MG/2ML IJ SOLN
INTRAMUSCULAR | Status: DC | PRN
  Administered 2016-12-29: 4 mg via INTRAVENOUS

## 2016-12-29 MED ORDER — OXYCODONE-ACETAMINOPHEN 5-325 MG PO TABS: 1.0000 | ORAL_TABLET | ORAL | 0 refills | Status: DC | PRN

## 2016-12-29 MED ORDER — BUPIVACAINE-EPINEPHRINE 0.25% -1:200000 IJ SOLN
INTRAMUSCULAR | Status: AC
  Filled 2016-12-29: qty 1

## 2016-12-29 MED ORDER — PROPOFOL 10 MG/ML IV BOLUS
INTRAVENOUS | Status: AC
  Filled 2016-12-29: qty 20

## 2016-12-29 MED ORDER — BUPIVACAINE-EPINEPHRINE 0.25% -1:200000 IJ SOLN
INTRAMUSCULAR | Status: DC | PRN
  Administered 2016-12-29: 8 mL

## 2016-12-29 MED ORDER — 0.9 % SODIUM CHLORIDE (POUR BTL) OPTIME
TOPICAL | Status: DC | PRN
  Administered 2016-12-29: 1000 mL

## 2016-12-29 MED ORDER — ONDANSETRON HCL 4 MG/2ML IJ SOLN
4.0000 mg | Freq: Once | INTRAMUSCULAR | Status: AC | PRN
  Administered 2016-12-29: 4 mg via INTRAVENOUS

## 2016-12-29 MED ORDER — LIDOCAINE 2% (20 MG/ML) 5 ML SYRINGE
INTRAMUSCULAR | Status: DC | PRN
  Administered 2016-12-29: 100 mg via INTRAVENOUS

## 2016-12-29 MED ORDER — MIDAZOLAM HCL 2 MG/2ML IJ SOLN
INTRAMUSCULAR | Status: DC | PRN
  Administered 2016-12-29: 2 mg via INTRAVENOUS

## 2016-12-29 SURGICAL SUPPLY — 25 items
BENZOIN TINCTURE PRP APPL 2/3 (GAUZE/BANDAGES/DRESSINGS) IMPLANT
BLADE HEX COATED 2.75 (ELECTRODE) ×3 IMPLANT
COVER SURGICAL LIGHT HANDLE (MISCELLANEOUS) ×3 IMPLANT
DECANTER SPIKE VIAL GLASS SM (MISCELLANEOUS) IMPLANT
DRAPE LAPAROTOMY T 102X78X121 (DRAPES) IMPLANT
DRAPE LAPAROTOMY TRNSV 102X78 (DRAPE) IMPLANT
ELECT REM PT RETURN 9FT ADLT (ELECTROSURGICAL) ×3
ELECTRODE REM PT RTRN 9FT ADLT (ELECTROSURGICAL) ×1 IMPLANT
EVACUATOR SILICONE 100CC (DRAIN) IMPLANT
GAUZE SPONGE 4X4 12PLY STRL (GAUZE/BANDAGES/DRESSINGS) ×3 IMPLANT
GLOVE BIO SURGEON STRL SZ7.5 (GLOVE) ×6 IMPLANT
GLOVE BIOGEL PI IND STRL 7.0 (GLOVE) ×1 IMPLANT
GLOVE BIOGEL PI INDICATOR 7.0 (GLOVE) ×2
GOWN STRL REUS W/TWL XL LVL3 (GOWN DISPOSABLE) ×6 IMPLANT
KIT BASIN OR (CUSTOM PROCEDURE TRAY) ×3 IMPLANT
MARKER SKIN DUAL TIP RULER LAB (MISCELLANEOUS) IMPLANT
NEEDLE HYPO 25X1 1.5 SAFETY (NEEDLE) ×3 IMPLANT
NS IRRIG 1000ML POUR BTL (IV SOLUTION) ×3 IMPLANT
PACK GENERAL/GYN (CUSTOM PROCEDURE TRAY) ×3 IMPLANT
STAPLER VISISTAT 35W (STAPLE) IMPLANT
SUT MNCRL AB 4-0 PS2 18 (SUTURE) IMPLANT
SUT NYLON 3 0 (SUTURE) ×3 IMPLANT
SUT VIC AB 3-0 SH 18 (SUTURE) IMPLANT
SYR CONTROL 10ML LL (SYRINGE) ×3 IMPLANT
TOWEL OR 17X26 10 PK STRL BLUE (TOWEL DISPOSABLE) IMPLANT

## 2016-12-29 NOTE — Anesthesia Procedure Notes (Signed)
Procedure Name: LMA Insertion Date/Time: 12/29/2016 9:31 AM Performed by: Dione Booze Pre-anesthesia Checklist: Patient identified, Emergency Drugs available, Patient being monitored and Suction available Patient Re-evaluated:Patient Re-evaluated prior to inductionOxygen Delivery Method: Circle system utilized Preoxygenation: Pre-oxygenation with 100% oxygen LMA: LMA with gastric port inserted LMA Size: 4.0 Number of attempts: 1 Placement Confirmation: positive ETCO2 Tube secured with: Tape Dental Injury: Teeth and Oropharynx as per pre-operative assessment

## 2016-12-29 NOTE — Anesthesia Postprocedure Evaluation (Signed)
Anesthesia Post Note  Patient: IASIA FORCIER  Procedure(s) Performed: Procedure(s) (LRB): EXCISION OF RIGHT AXILLARY CYST (Right)  Patient location during evaluation: PACU Anesthesia Type: General Level of consciousness: awake and alert Pain management: pain level controlled Vital Signs Assessment: post-procedure vital signs reviewed and stable Respiratory status: spontaneous breathing, nonlabored ventilation, respiratory function stable and patient connected to nasal cannula oxygen Cardiovascular status: blood pressure returned to baseline and stable Postop Assessment: no signs of nausea or vomiting Anesthetic complications: no       Last Vitals:  Vitals:   12/29/16 1129 12/29/16 1238  BP: 105/82 111/65  Pulse: 93 88  Resp: 16 16  Temp: 36.7 C     Last Pain:  Vitals:   12/29/16 1238  TempSrc:   PainSc: 1                  Asuncion Shibata DAVID

## 2016-12-29 NOTE — Interval H&P Note (Signed)
History and Physical Interval Note:  12/29/2016 9:13 AM  Krystal Harris  has presented today for surgery, with the diagnosis of right axillary cyst  The various methods of treatment have been discussed with the patient and family. After consideration of risks, benefits and other options for treatment, the patient has consented to  Procedure(s): EXCISION OF RIGHT AXILLARY CYST (Right) as a surgical intervention .  The patient's history has been reviewed, patient examined, no change in status, stable for surgery.  I have reviewed the patient's chart and labs.  Questions were answered to the patient's satisfaction.     Rosario Jacks., Anne Hahn

## 2016-12-29 NOTE — Op Note (Signed)
12/29/2016  10:04 AM  PATIENT:  Krystal Harris  31 y.o. female  PRE-OPERATIVE DIAGNOSIS:  right axillary cyst  POST-OPERATIVE DIAGNOSIS:  right axillary cyst  PROCEDURE:  Procedure(s): EXCISION OF RIGHT AXILLARY CYST 3x4cm (Right)  SURGEON:  Surgeon(s) and Role:    * [name], MD - Primary  PHYSICIAN ASSISTANT:   ASSISTANTS: none   ANESTHESIA:   local and general  EBL:  Total I/O In: 500 [I.V.:500] Out: 15 [Blood:10]  BLOOD ADMINISTERED:none  DRAINS: none   LOCAL MEDICATIONS USED:  BUPIVICAINE   SPECIMEN:  Source of Specimen:  Right axillary cyst  DISPOSITION OF SPECIMEN:  PATHOLOGY  COUNTS:  YES  TOURNIQUET:  * No tourniquets in log *  DICTATION: .Dragon Dictation  Indications for procedure: Patient is a 31 year old female who was seen in clinic secondary to a right axillary cyst. Patient states there is been no drainage from the area was become painful and has gotten bigger recently. Patient states that at this likely excised.  Details of the procedure: After the patient was consented she was taken back to the OR and placed in supine position bilateral testes in place. After appropriate antibiotic from a timeout was called all facts verified.  An elliptical incision was made over the area of the cyst dissection was taken down to healthy fat tissue. The cyst was encapsulated in the subcutaneous tissues fat. There is no spillage of the cyst contents. This measured approximately 3 x 4 cm in size. This was sent off to pathology. 1/4% Marcaine was used to infiltrate the incisional area.  At this time hemostasis was achieved using cautery. 2-0 Vicryl sutures were used to reapproximate the deep dermal layer. 3-0 Monocryl was used to reapproximate the skin. The wound was then dressed with Dermabond. The patient tied the procedure well was taken to the recovery room stable condition.  PLAN OF CARE: Discharge to home after PACU  PATIENT DISPOSITION:  PACU -  hemodynamically stable.   Delay start of Pharmacological VTE agent (>24hrs) due to surgical blood loss or risk of bleeding: not applicable

## 2016-12-29 NOTE — Transfer of Care (Signed)
Immediate Anesthesia Transfer of Care Note  Patient: Krystal Harris  Procedure(s) Performed: Procedure(s): EXCISION OF RIGHT AXILLARY CYST (Right)  Patient Location: PACU  Anesthesia Type:General  Level of Consciousness: awake, alert , oriented and patient cooperative  Airway & Oxygen Therapy: Patient Spontanous Breathing and Patient connected to face mask oxygen  Post-op Assessment: Report given to RN and Post -op Vital signs reviewed and stable  Post vital signs: Reviewed and stable  Last Vitals:  Vitals:   12/29/16 0736  BP: 139/90  Pulse: 92  Resp: 18  Temp: 36.8 C    Last Pain:  Vitals:   12/29/16 0736  TempSrc: Oral      Patients Stated Pain Goal: 4 (42/68/34 1962)  Complications: No apparent anesthesia complications

## 2016-12-29 NOTE — Discharge Instructions (Signed)
Incision Care, Adult An incision is a surgical cut that is made through your skin. Most incisions are closed after surgery. Your incision may be closed with stitches (sutures), staples, skin glue, or adhesive strips. You may need to return to your health care provider to have sutures or staples removed. This may occur several days to several weeks after your surgery. The incision needs to be cared for properly to prevent infection. How to care for your incision Incision care    Follow instructions from your health care provider about how to take care of your incision. Make sure you:  Wash your hands with soap and water before you change the bandage (dressing). If soap and water are not available, use hand sanitizer.  Change your dressing as told by your health care provider.  Leave sutures, skin glue, or adhesive strips in place. These skin closures may need to stay in place for 2 weeks or longer. If adhesive strip edges start to loosen and curl up, you may trim the loose edges. .  Check your incision area every day for signs of infection. Check for:  More redness, swelling, or pain.  More fluid or blood.  Warmth.  Pus or a bad smell.  Ask your health care provider how to clean the incision. This may include:  Using mild soap and water.  Using a clean towel to pat the incision dry after cleaning it.  Applying a cream or ointment. Do this only as told by your health care provider.  Covering the incision with a clean dressing.  Ask your health care provider when you can leave the incision uncovered.  Do not take baths, swim, or use a hot tub until your health care provider approves. Ask your health care provider if you can take showers. You may only be allowed to take sponge baths for bathing. Medicines   If you were prescribed an antibiotic medicine, cream, or ointment, take or apply the antibiotic as told by your health care provider. Do not stop taking or applying the  antibiotic even if your condition improves.  Take over-the-counter and prescription medicines only as told by your health care provider. General instructions   Limit movement around your incision to improve healing.  Avoid straining, lifting, or exercise for the first month, or for as long as told by your health care provider.  Follow instructions from your health care provider about returning to your normal activities.  Ask your health care provider what activities are safe.  Protect your incision from the sun when you are outside for the first 6 months, or for as long as told by your health care provider. Apply sunscreen around the scar or cover it up.  Keep all follow-up visits as told by your health care provider. This is important. Contact a health care provider if:  Your have more redness, swelling, or pain around the incision.  You have more fluid or blood coming from the incision.  Your incision feels warm to the touch.  You have pus or a bad smell coming from the incision.  You have a fever or shaking chills.  You are nauseous or you vomit.  You are dizzy.  Your sutures or staples come undone. Get help right away if:  You have a red streak coming from your incision.  Your incision bleeds through the dressing and the bleeding does not stop with gentle pressure.  The edges of your incision open up and separate.  You have severe pain.  You have a rash.  You are confused.  You faint.  You have trouble breathing and a fast heartbeat. This information is not intended to replace advice given to you by your health care provider. Make sure you discuss any questions you have with your health care provider. Document Released: 04/21/2005 Document Revised: 06/09/2016 Document Reviewed: 04/19/2016 Elsevier Interactive Patient Education  2017 [employer].

## 2016-12-29 NOTE — H&P (View-Only) (Signed)
History of Present Illness [name] MD; 12/08/2016 10:51 AM) The patient is a 31 year old female who presents with a complaint of cyst. 31 year old female who is referred by Dr. Collene Mares for evaluation of a right axillary cyst. Patient states she's had pain and noticed increase in size. Patient ultrasound which revealed: IMPRESSION: Sebaceous cyst in the right axilla measuring up to 3.7 cm.  Patient states that she's tried warm compresses over this does not help. Patient was also given a trial of Bactrim which did not improve.   Allergies Malachy Moan, RMA; 12/08/2016 10:31 AM) Penicillin G Pot in Dextrose *PENICILLINS*   Medication History Malachy Moan, RMA; 12/08/2016 10:32 AM) Flexeril (10MG  Tablet, Oral) Active. Omeprazole (40MG  Capsule ER, Oral) Active. Medications Reconciled  Vitals Malachy Moan RMA; 12/08/2016 10:33 AM) 12/08/2016 10:32 AM Weight: 274.4 lb Height: 64in Body Surface Area: 2.24 m Body Mass Index: 47.1 kg/m  Temp.: 98.18F  Pulse: 68 (Regular)  BP: 130/90 (Sitting, Left Arm, Standard)       Physical Exam [name] MD; 12/08/2016 10:51 AM) General Mental Status-Alert. General Appearance-Consistent with stated age. Hydration-Well hydrated. Voice-Normal.  Integumentary Note: Right axillary cyst approximately 2 x 3 cm, subcutaneous, noninflamed, no drainage   Cardiovascular Cardiovascular examination reveals -normal heart sounds, regular rate and rhythm with no murmurs and normal pedal pulses bilaterally.  Abdomen Inspection Inspection of the abdomen reveals - No Hernias. Skin - Scar - no surgical scars. Palpation/Percussion Palpation and Percussion of the abdomen reveal - Soft, Non Tender, No Rebound tenderness, No Rigidity (guarding) and No hepatosplenomegaly. Auscultation Auscultation of the abdomen reveals - Bowel sounds normal.  Neurologic Neurologic evaluation reveals -alert and oriented x 3  with no impairment of recent or remote memory. Mental Status-Normal.    Assessment & Plan [name] MD; 12/08/2016 10:52 AM) SEBACEOUS CYST OF RIGHT AXILLA (L72.3) Impression: 31 year old female with a right sebaceous cyst 1. Will proceed. In for excision of cyst 2. I discussed with the patient the risks and benefits the procedure to include but not limited to: Infection, bleeding, damage to structures, possible recurrence, possible wound healing issues. Patient was understanding and wished to proceed.

## 2016-12-29 NOTE — Anesthesia Preprocedure Evaluation (Signed)
Anesthesia Evaluation  Patient identified by MRN, date of birth, ID band Patient awake    Reviewed: Allergy & Precautions, NPO status , Patient's Chart, lab work & pertinent test results  History of Anesthesia Complications (+) PONV  Airway Mallampati: I  TM Distance: >3 FB Neck ROM: Full    Dental   Pulmonary    Pulmonary exam normal        Cardiovascular Normal cardiovascular exam     Neuro/Psych    GI/Hepatic GERD  Medicated and Controlled,  Endo/Other  Morbid obesity  Renal/GU      Musculoskeletal   Abdominal   Peds  Hematology   Anesthesia Other Findings   Reproductive/Obstetrics                             Anesthesia Physical Anesthesia Plan  ASA: III  Anesthesia Plan: General   Post-op Pain Management:    Induction: Intravenous  Airway Management Planned: LMA  Additional Equipment:   Intra-op Plan:   Post-operative Plan: Extubation in OR  Informed Consent: I have reviewed the patients History and Physical, chart, labs and discussed the procedure including the risks, benefits and alternatives for the proposed anesthesia with the patient or authorized representative who has indicated his/her understanding and acceptance.     Plan Discussed with: CRNA and Surgeon  Anesthesia Plan Comments:         Anesthesia Quick Evaluation

## 2017-01-25 ENCOUNTER — Encounter: Payer: BC Managed Care – PPO | Admitting: Family Medicine

## 2017-02-12 ENCOUNTER — Encounter: Payer: Self-pay | Admitting: Physician Assistant

## 2017-02-12 ENCOUNTER — Ambulatory Visit (INDEPENDENT_AMBULATORY_CARE_PROVIDER_SITE_OTHER): Payer: BC Managed Care – PPO | Admitting: Physician Assistant

## 2017-02-12 VITALS — BP 130/88 | HR 110 | Temp 98.5°F | Resp 16 | Ht 64.0 in | Wt 278.0 lb

## 2017-02-12 DIAGNOSIS — J329 Chronic sinusitis, unspecified: Secondary | ICD-10-CM | POA: Diagnosis not present

## 2017-02-12 DIAGNOSIS — B9789 Other viral agents as the cause of diseases classified elsewhere: Secondary | ICD-10-CM

## 2017-02-12 MED ORDER — FLUTICASONE PROPIONATE 50 MCG/ACT NA SUSP: 2.0000 | Freq: Every day | NASAL | 1 refills | Status: DC

## 2017-02-12 NOTE — Progress Notes (Signed)
Pre visit review using our clinic review tool, if applicable. No additional management support is needed unless otherwise documented below in the visit note. 

## 2017-02-12 NOTE — Progress Notes (Signed)
Patient presents to clinic today c/o 4 days of sinus pressure, nasal congestion. Endorses L ear ache x 1 day. Denies fever, chills, chest congestion. Endorses headache. Denies tooth pain. Denies recent travel.  Has tried Sudafed and benadryl helping somewhat.   Past Medical History:  Diagnosis Date  . Chicken pox   . GERD (gastroesophageal reflux disease)   . PONV (postoperative nausea and vomiting)    vomiting after surgery gallbladder removal 2016  . Vitamin D deficiency     Current Outpatient Prescriptions on File Prior to Visit  Medication Sig Dispense Refill  . omeprazole (PRILOSEC) 40 MG capsule Take 1 capsule (40 mg total) by mouth daily. (Patient taking differently: Take 40 mg by mouth daily as needed. ) 30 capsule 3   No current facility-administered medications on file prior to visit.     Allergies  Allergen Reactions  . Penicillins Hives and Nausea And Vomiting    Has patient had a PCN reaction causing immediate rash, facial/tongue/throat swelling, SOB or lightheadedness with hypotension: no Has patient had a PCN reaction causing severe rash involving mucus membranes or skin necrosis: unknown Has patient had a PCN reaction that required hospitalization unknown Has patient had a PCN reaction occurring within the last 10 years: unknown If all of the above answers are "NO", then may proceed with Cephalosporin use.     Family History  Problem Relation Age of Onset  . Diabetes Mother   . Hypertension Mother   . Stroke Maternal Grandmother   . Cancer Maternal Grandfather     lung  . Diabetes Maternal Grandfather   . Stroke Maternal Grandfather   . Hypertension Maternal Grandfather     Social History   Social History  . Marital status: Single    Spouse name: N/A  . Number of children: N/A  . Years of education: N/A   Social History Main Topics  . Smoking status: Never Smoker  . Smokeless tobacco: Never Used  . Alcohol use Yes     Comment: occasionally  .  Drug use: No  . Sexual activity: Yes   Other Topics Concern  . None   Social History Narrative  . None   Review of Systems - See HPI.  All other ROS are negative.  BP 130/88   Pulse (!) 110   Temp 98.5 F (36.9 C) (Oral)   Resp 16   Ht 5\' 4"  (1.626 m)   Wt 278 lb (126.1 kg)   SpO2 98%   BMI 47.72 kg/m   Physical Exam  Constitutional: She is oriented to person, place, and time and well-developed, well-nourished, and in no distress.  HENT:  Head: Normocephalic and atraumatic.  Right Ear: Tympanic membrane normal.  Left Ear: Tympanic membrane normal.  Nose: Mucosal edema present. No rhinorrhea.  Mouth/Throat: Uvula is midline, oropharynx is clear and moist and mucous membranes are normal.  Eyes: Conjunctivae are normal.  Neck: Neck supple.  Cardiovascular: Normal rate, regular rhythm, normal heart sounds and intact distal pulses.   Pulmonary/Chest: Effort normal and breath sounds normal. No respiratory distress. She has no wheezes. She has no rales. She exhibits no tenderness.  Neurological: She is alert and oriented to person, place, and time.  Skin: Skin is warm and dry. No rash noted.  Psychiatric: Affect normal.  Vitals reviewed.  Recent Results (from the past 2160 hour(s))  POC Urine Pregnancy, ED (do NOT order at [facility]     Status: None   Collection Time: 12/04/16  7:53  PM  Result Value Ref Range   Preg Test, Ur NEGATIVE NEGATIVE    Comment:        THE SENSITIVITY OF THIS METHODOLOGY IS >24 mIU/mL   CBC     Status: Abnormal   Collection Time: 12/25/16  1:11 PM  Result Value Ref Range   WBC 8.0 4.0 - 10.5 K/uL   RBC 4.20 3.87 - 5.11 MIL/uL   Hemoglobin 11.2 (L) 12.0 - 15.0 g/dL   HCT 34.8 (L) 36.0 - 46.0 %   MCV 82.9 78.0 - 100.0 fL   MCH 26.7 26.0 - 34.0 pg   MCHC 32.2 30.0 - 36.0 g/dL   RDW 14.4 11.5 - 15.5 %   Platelets 369 150 - 400 K/uL  hCG, serum, qualitative     Status: None   Collection Time: 12/25/16  1:11 PM  Result Value Ref Range   Preg,  Serum NEGATIVE NEGATIVE    Comment:        THE SENSITIVITY OF THIS METHODOLOGY IS >10 mIU/mL.    Assessment/Plan: 1. Viral sinusitis Rx Flonase. Start saline nasal spray and Claritin D. Supportive measures reviewed. Follow-up or call office if not improving within 48 hours or if new symptoms develop. - fluticasone (FLONASE) 50 MCG/ACT nasal spray; Place 2 sprays into both nostrils daily.  Dispense: 16 g; Refill: 1   Leeanne Rio, [place]

## 2017-02-12 NOTE — Patient Instructions (Signed)
We are sorry that you are not feeling well.  Here is how we plan to help!  Based on what you have shared with me it looks like you have sinusitis.  Sinusitis is inflammation and infection in the sinus cavities of the head.  Based on your presentation I believe you most likely have Acute Viral Sinusitis.This is an infection most likely caused by a virus. There is not specific treatment for viral sinusitis other than to help you with the symptoms until the infection runs its course.  You may use an oral decongestant such as Mucinex D or if you have glaucoma or high blood pressure use plain Mucinex. Saline nasal spray help and can safely be used as often as needed for congestion, I have prescribed: Fluticasone nasal spray two sprays in each nostril twice a day  Some authorities believe that zinc sprays or the use of Echinacea may shorten the course of your symptoms.  Sinus infections are not as easily transmitted as other respiratory infection, however we still recommend that you avoid close contact with loved ones, especially the very young and elderly.  Remember to wash your hands thoroughly throughout the day as this is the number one way to prevent the spread of infection!  Home Care:  Only take medications as instructed by your medical team.  Complete the entire course of an antibiotic.  Do not take these medications with alcohol.  A steam or ultrasonic humidifier can help congestion.  You can place a towel over your head and breathe in the steam from hot water coming from a faucet.  Avoid close contacts especially the very young and the elderly.  Cover your mouth when you cough or sneeze.  Always remember to wash your hands.  Get Help Right Away If:  You develop worsening fever or sinus pain.  You develop a severe head ache or visual changes.  Your symptoms persist after you have completed your treatment plan.  Make sure you  Understand these instructions.  Will watch your  condition.  Will get help right away if you are not doing well or get worse.   

## 2017-02-14 ENCOUNTER — Telehealth: Payer: Self-pay | Admitting: Family Medicine

## 2017-02-14 MED ORDER — DOXYCYCLINE HYCLATE 100 MG PO CAPS: 100.0000 mg | ORAL_CAPSULE | Freq: Two times a day (BID) | ORAL | 0 refills | Status: DC

## 2017-02-14 NOTE — Telephone Encounter (Signed)
I have sent in a script for Doxycycline 100 mg BID x 10 days. Continue supportive measures. The only contraindication for her using this antibiotic would be if she has concerns of pregnancy. If she does let us know.  Follow-up if symptoms are not improving.

## 2017-02-14 NOTE — Telephone Encounter (Signed)
Spoke with patient and her symptoms are worst since Monday. At appointment on Monday she was having nasal congestion and sinus pressure. Using Zyrtec and Flonase Now having chest congestion, coughing up yellow/brownish sputum, fever of 100.8 F yesterday  Preferred pharmacy is [facility] on [address]

## 2017-02-14 NOTE — Telephone Encounter (Signed)
LMOVM advising patient the rx for antibiotic to the pharmacy. Only contradiction is if she is pregnant. Please let us know if this is a concern. Please let us know if symptoms have not improved after finishing abx.

## 2017-02-14 NOTE — Telephone Encounter (Signed)
Pt states that she is not feeling any better from Monday's visit with [facility] and asking if abx is what she needs or does she need to come back in. Please advise

## 2017-04-11 ENCOUNTER — Encounter: Payer: Self-pay | Admitting: Family Medicine

## 2017-04-11 ENCOUNTER — Ambulatory Visit (INDEPENDENT_AMBULATORY_CARE_PROVIDER_SITE_OTHER): Payer: BC Managed Care – PPO | Admitting: Family Medicine

## 2017-04-11 DIAGNOSIS — F419 Anxiety disorder, unspecified: Secondary | ICD-10-CM

## 2017-04-11 DIAGNOSIS — G4700 Insomnia, unspecified: Secondary | ICD-10-CM

## 2017-04-11 MED ORDER — SERTRALINE HCL 50 MG PO TABS: 50.0000 mg | ORAL_TABLET | Freq: Every day | ORAL | 3 refills | Status: DC

## 2017-04-11 MED ORDER — TRAZODONE HCL 50 MG PO TABS: 25.0000 mg | ORAL_TABLET | Freq: Every evening | ORAL | 3 refills | Status: DC | PRN

## 2017-04-11 NOTE — Patient Instructions (Signed)
Follow up in 4-6 weeks to recheck sleep and anxiety Start the Trazodone nightly- 1/2 tab to start and increase to 1 tab as needed Start the Sertraline once daily for anxiety- start w/ 1/2 tab x2 weeks and then increase to 1 tab daily Call with any questions or concerns Hang in there!!!
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
## 2017-04-11 NOTE — Assessment & Plan Note (Signed)
New.  Pt is having a hard time dealing w/ all of her recent stressors.  Start low dose Zoloft and monitor for improvement.  Will follow.

## 2017-04-11 NOTE — Assessment & Plan Note (Signed)
New.  Pt has been struggling with this for 3-4 months.  I suspect this is a consequence of her increased anxiety.  Will start Zoloft for anxiety and trazodone as needed for sleep.  Will follow.  Pt expressed understanding and is in agreement w/ plan.

## 2017-04-11 NOTE — Progress Notes (Signed)
Pre visit review using our clinic review tool, if applicable. No additional management support is needed unless otherwise documented below in the visit note. 

## 2017-05-16 ENCOUNTER — Encounter: Payer: Self-pay | Admitting: Family Medicine

## 2017-05-16 ENCOUNTER — Ambulatory Visit (INDEPENDENT_AMBULATORY_CARE_PROVIDER_SITE_OTHER): Payer: BC Managed Care – PPO | Admitting: Family Medicine

## 2017-05-16 VITALS — BP 122/82 | HR 99 | Temp 98.8°F | Resp 16 | Ht 64.0 in | Wt 281.0 lb

## 2017-05-16 DIAGNOSIS — G4700 Insomnia, unspecified: Secondary | ICD-10-CM | POA: Diagnosis not present

## 2017-05-16 DIAGNOSIS — F419 Anxiety disorder, unspecified: Secondary | ICD-10-CM

## 2017-05-16 MED ORDER — OMEPRAZOLE 40 MG PO CPDR: 40.0000 mg | DELAYED_RELEASE_CAPSULE | Freq: Every day | ORAL | 1 refills | Status: DC

## 2017-05-16 MED ORDER — SERTRALINE HCL 100 MG PO TABS: 100.0000 mg | ORAL_TABLET | Freq: Every day | ORAL | 3 refills | Status: DC

## 2017-05-16 NOTE — Progress Notes (Signed)
Pre visit review using our clinic review tool, if applicable. No additional management support is needed unless otherwise documented below in the visit note. 

## 2017-05-16 NOTE — Progress Notes (Signed)
   Subjective:    Patient ID: Krystal Harris, female    DOB: 08-22-1986, 31 y.o.   MRN: [id number]  HPI Anxiety- noted at last visit.  Started on Zoloft at last visit.  Pt doesn't feel anxiety has improved.  Is going to counseling once a week- Dr Eulas Post.  Pt feels her confidence is low and she 'shuts down'.  Is working on that.  Insomnia- trazodone 'definitely works' but is unable to sleep w/out it.   Review of Systems For ROS see HPI     Objective:   Physical Exam  Constitutional: She is oriented to person, place, and time. She appears well-developed and well-nourished. No distress.  HENT:  Head: Normocephalic and atraumatic.  Neurological: She is alert and oriented to person, place, and time.  Skin: Skin is warm and dry.  Psychiatric: She has a normal mood and affect. Her behavior is normal. Thought content normal.  Vitals reviewed.         Assessment & Plan:

## 2017-05-16 NOTE — Assessment & Plan Note (Signed)
Improved since starting Trazodone.  Pt is able to fall asleep and stay asleep when taking medication

## 2017-05-16 NOTE — Assessment & Plan Note (Signed)
Improving w/ the help of medication and counseling but still not well controlled.  Increase Zoloft to 100mg  daily and continue to monitor.  Pt expressed understanding and is in agreement w/ plan.

## 2017-05-16 NOTE — Patient Instructions (Signed)
Follow up in 6 weeks to recheck mood Increase the Sertraline to 100mg  daily- 2 of what you have at home and 1 of the new prescription Continue to Trazodone nightly for sleep Continue to work on healthy diet and regular exercise- you're doing great!!! Call with any questions or concerns Hang in there!!!

## 2017-06-27 ENCOUNTER — Ambulatory Visit: Payer: BC Managed Care – PPO | Admitting: Family Medicine

## 2017-07-02 ENCOUNTER — Encounter: Payer: Self-pay | Admitting: Physician Assistant

## 2017-07-02 ENCOUNTER — Ambulatory Visit (INDEPENDENT_AMBULATORY_CARE_PROVIDER_SITE_OTHER): Payer: BC Managed Care – PPO | Admitting: Physician Assistant

## 2017-07-02 VITALS — BP 124/80 | HR 96 | Temp 98.5°F | Resp 14 | Ht 64.0 in | Wt 282.0 lb

## 2017-07-02 DIAGNOSIS — J019 Acute sinusitis, unspecified: Secondary | ICD-10-CM | POA: Diagnosis not present

## 2017-07-02 MED ORDER — DOXYCYCLINE HYCLATE 100 MG PO CAPS: 100.0000 mg | ORAL_CAPSULE | Freq: Two times a day (BID) | ORAL | 0 refills | Status: DC

## 2017-07-02 NOTE — Progress Notes (Signed)
Pre visit review using our clinic review tool, if applicable. No additional management support is needed unless otherwise documented below in the visit note. 

## 2017-07-02 NOTE — Patient Instructions (Signed)
Increase fluid intake.  Use Saline nasal spray.  Take a daily multivitamin. Continue Flonase. Start a Mucinex-DM. Also switch the Zyrtec for an over-the-counter Xyzal.  Place a humidifier in the bedroom.  If symptoms are not improving in 24 to 48 hours, please start and take the antibiotic as directed.  Please call or return clinic if symptoms are not improving.  Sinusitis Sinusitis is redness, soreness, and swelling (inflammation) of the paranasal sinuses. Paranasal sinuses are air pockets within the bones of your face (beneath the eyes, the middle of the forehead, or above the eyes). In healthy paranasal sinuses, mucus is able to drain out, and air is able to circulate through them by way of your nose. However, when your paranasal sinuses are inflamed, mucus and air can become trapped. This can allow bacteria and other germs to grow and cause infection. Sinusitis can develop quickly and last only a short time (acute) or continue over a long period (chronic). Sinusitis that lasts for more than 12 weeks is considered chronic.  CAUSES  Causes of sinusitis include:  Allergies.  Structural abnormalities, such as displacement of the cartilage that separates your nostrils (deviated septum), which can decrease the air flow through your nose and sinuses and affect sinus drainage.  Functional abnormalities, such as when the small hairs (cilia) that line your sinuses and help remove mucus do not work properly or are not present. SYMPTOMS  Symptoms of acute and chronic sinusitis are the same. The primary symptoms are pain and pressure around the affected sinuses. Other symptoms include:  Upper toothache.  Earache.  Headache.  Bad breath.  Decreased sense of smell and taste.  A cough, which worsens when you are lying flat.  Fatigue.  Fever.  Thick drainage from your nose, which often is green and may contain pus (purulent).  Swelling and warmth over the affected sinuses. DIAGNOSIS  Your  caregiver will perform a physical exam. During the exam, your caregiver may:  Look in your nose for signs of abnormal growths in your nostrils (nasal polyps).  Tap over the affected sinus to check for signs of infection.  View the inside of your sinuses (endoscopy) with a special imaging device with a light attached (endoscope), which is inserted into your sinuses. If your caregiver suspects that you have chronic sinusitis, one or more of the following tests may be recommended:  Allergy tests.  Nasal culture A sample of mucus is taken from your nose and sent to a lab and screened for bacteria.  Nasal cytology A sample of mucus is taken from your nose and examined by your caregiver to determine if your sinusitis is related to an allergy. TREATMENT  Most cases of acute sinusitis are related to a viral infection and will resolve on their own within 10 days. Sometimes medicines are prescribed to help relieve symptoms (pain medicine, decongestants, nasal steroid sprays, or saline sprays).  However, for sinusitis related to a bacterial infection, your caregiver will prescribe antibiotic medicines. These are medicines that will help kill the bacteria causing the infection.  Rarely, sinusitis is caused by a fungal infection. In theses cases, your caregiver will prescribe antifungal medicine. For some cases of chronic sinusitis, surgery is needed. Generally, these are cases in which sinusitis recurs more than 3 times per year, despite other treatments. HOME CARE INSTRUCTIONS   Drink plenty of water. Water helps thin the mucus so your sinuses can drain more easily.  Use a humidifier.  Inhale steam 3 to 4 times a  day (for example, sit in the bathroom with the shower running).  Apply a warm, moist washcloth to your face 3 to 4 times a day, or as directed by your caregiver.  Use saline nasal sprays to help moisten and clean your sinuses.  Take over-the-counter or prescription medicines for pain,  discomfort, or fever only as directed by your caregiver. SEEK IMMEDIATE MEDICAL CARE IF:  You have increasing pain or severe headaches.  You have nausea, vomiting, or drowsiness.  You have swelling around your face.  You have vision problems.  You have a stiff neck.  You have difficulty breathing. MAKE SURE YOU:   Understand these instructions.  Will watch your condition.  Will get help right away if you are not doing well or get worse. Document Released: 10/02/2005 Document Revised: 12/25/2011 Document Reviewed: 10/17/2011 [facility] Patient Information 2014 [place], [place].

## 2017-07-02 NOTE — Progress Notes (Signed)
Patient presents to clinic today c/o 5 days of nasal drainage and sinus congestion with a cough that is productive of yellow-green mucous. Is having significant sinus pressure and some sinus pain. Denies tooth pain.. Notes some mild SOB after a coughing spell. Is not resting well. Has recently been to [place] and [place] Denies sick contact at home. Has taken Flonase, Zyrtec, Dayquil and Sudafed without substantial relief.   Past Medical History:  Diagnosis Date  . Chicken pox   . GERD (gastroesophageal reflux disease)   . PONV (postoperative nausea and vomiting)    vomiting after surgery gallbladder removal 2016  . Vitamin D deficiency     Current Outpatient Prescriptions on File Prior to Visit  Medication Sig Dispense Refill  . fluticasone (FLONASE) 50 MCG/ACT nasal spray Place 2 sprays into both nostrils daily. 16 g 1  . omeprazole (PRILOSEC) 40 MG capsule Take 1 capsule (40 mg total) by mouth daily. 90 capsule 1  . sertraline (ZOLOFT) 100 MG tablet Take 1 tablet (100 mg total) by mouth daily. 30 tablet 3  . traZODone (DESYREL) 50 MG tablet Take 0.5-1 tablets (25-50 mg total) by mouth at bedtime as needed for sleep. 30 tablet 3   No current facility-administered medications on file prior to visit.     Allergies  Allergen Reactions  . Penicillins Hives and Nausea And Vomiting    Has patient had a PCN reaction causing immediate rash, facial/tongue/throat swelling, SOB or lightheadedness with hypotension: no Has patient had a PCN reaction causing severe rash involving mucus membranes or skin necrosis: unknown Has patient had a PCN reaction that required hospitalization unknown Has patient had a PCN reaction occurring within the last 10 years: unknown If all of the above answers are "NO", then may proceed with Cephalosporin use.     Family History  Problem Relation Age of Onset  . Diabetes Mother   . Hypertension Mother   . Stroke Maternal Grandmother   . Cancer Maternal  Grandfather        lung  . Diabetes Maternal Grandfather   . Stroke Maternal Grandfather   . Hypertension Maternal Grandfather     Social History   Social History  . Marital status: Single    Spouse name: N/A  . Number of children: N/A  . Years of education: N/A   Social History Main Topics  . Smoking status: Never Smoker  . Smokeless tobacco: Never Used  . Alcohol use Yes     Comment: occasionally  . Drug use: No  . Sexual activity: Yes   Other Topics Concern  . None   Social History Narrative  . None   Review of Systems - See HPI.  All other ROS are negative.  BP 124/80   Pulse 96   Temp 98.5 F (36.9 C) (Oral)   Resp 14   Ht 5\' 4"  (1.626 m)   Wt 282 lb (127.9 kg)   SpO2 99%   BMI 48.41 kg/m   Physical Exam  Constitutional: She is oriented to person, place, and time and well-developed, well-nourished, and in no distress.  HENT:  Head: Normocephalic and atraumatic.  Right Ear: External ear normal.  Left Ear: External ear normal.  Eyes: Conjunctivae are normal.  Neck: Neck supple.  Cardiovascular: Normal rate, regular rhythm, normal heart sounds and intact distal pulses.   Pulmonary/Chest: Effort normal. No respiratory distress. She has no wheezes. She has no rales. She exhibits no tenderness.  Lymphadenopathy:    She has  no cervical adenopathy.  Neurological: She is alert and oriented to person, place, and time.  Skin: Skin is warm and dry. No rash noted.  Psychiatric: Affect normal.  Vitals reviewed.  Assessment/Plan: 1. Acute non-recurrent sinusitis, unspecified location Rx Doxycycline.  Increase fluids.  Rest.  Saline nasal spray.  Probiotic.  Mucinex as directed.  Humidifier in bedroom.  Call or return to clinic if symptoms are not improving.  - doxycycline (VIBRAMYCIN) 100 MG capsule; Take 1 capsule (100 mg total) by mouth 2 (two) times daily.  Dispense: 20 capsule; Refill: 0   Leeanne Rio, [place]

## 2017-07-05 ENCOUNTER — Ambulatory Visit (INDEPENDENT_AMBULATORY_CARE_PROVIDER_SITE_OTHER): Payer: BC Managed Care – PPO | Admitting: Family Medicine

## 2017-07-05 ENCOUNTER — Encounter: Payer: Self-pay | Admitting: Family Medicine

## 2017-07-05 VITALS — BP 121/90 | HR 92 | Temp 98.5°F | Resp 16 | Ht 64.0 in | Wt 287.0 lb

## 2017-07-05 DIAGNOSIS — F419 Anxiety disorder, unspecified: Secondary | ICD-10-CM | POA: Diagnosis not present

## 2017-07-05 DIAGNOSIS — J069 Acute upper respiratory infection, unspecified: Secondary | ICD-10-CM

## 2017-07-05 NOTE — Progress Notes (Signed)
Pre visit review using our clinic review tool, if applicable. No additional management support is needed unless otherwise documented below in the visit note. 

## 2017-07-05 NOTE — Assessment & Plan Note (Signed)
Improved since increasing the Sertraline to 100mg  daily.  No med changes at this time.  Will continue to follow.

## 2017-07-05 NOTE — Patient Instructions (Signed)
Follow up as needed or as scheduled Continue the Sertraline daily for mood Continue the Doxy for the sinus infection Switch to Mucinex DM to help w/ cough Continue your allergy medication Call with any questions or concerns Hang in there!!!

## 2017-07-05 NOTE — Assessment & Plan Note (Signed)
Reviewed Cody's note from Monday.  Told pt that 3 days was too soon to determine if the abx were working and she needed to give it at least 7 days.  Encouraged her to switch to Mucinex DM for cough.  Reviewed supportive care and red flags that should prompt return.  Pt expressed understanding and is in agreement w/ plan.

## 2017-07-05 NOTE — Progress Notes (Signed)
   Subjective:    Patient ID: Krystal Harris, female    DOB: Nov 27, 1985, 31 y.o.   MRN: [id number]  HPI Anxiety/depression- Sertraline was increased to 100mg  at last visit.  Pt reports things are better 'for the most part' but 'still has some days' that are more stressful.  Continues to sleep well.  Pt reports feeling less on edge, less wound.  Tearfulness has improved.   Review of Systems For ROS see HPI     Objective:   Physical Exam  Constitutional: She is oriented to person, place, and time. She appears well-developed and well-nourished. No distress.  HENT:  Head: Normocephalic and atraumatic.  Neurological: She is alert and oriented to person, place, and time.  Skin: Skin is warm and dry.  Psychiatric: She has a normal mood and affect. Her behavior is normal. Thought content normal.  Vitals reviewed.         Assessment & Plan:

## 2017-08-02 ENCOUNTER — Other Ambulatory Visit: Payer: Self-pay | Admitting: Family Medicine

## 2017-08-13 ENCOUNTER — Encounter: Payer: Self-pay | Admitting: Family Medicine

## 2017-08-13 ENCOUNTER — Ambulatory Visit (INDEPENDENT_AMBULATORY_CARE_PROVIDER_SITE_OTHER): Payer: BC Managed Care – PPO | Admitting: Family Medicine

## 2017-08-13 VITALS — BP 128/78 | HR 86 | Temp 98.7°F | Wt 287.1 lb

## 2017-08-13 DIAGNOSIS — Z23 Encounter for immunization: Secondary | ICD-10-CM | POA: Diagnosis not present

## 2017-08-13 DIAGNOSIS — F419 Anxiety disorder, unspecified: Secondary | ICD-10-CM

## 2017-08-13 DIAGNOSIS — G4700 Insomnia, unspecified: Secondary | ICD-10-CM

## 2017-08-13 MED ORDER — FLUOXETINE HCL 20 MG PO TABS: 20.0000 mg | ORAL_TABLET | Freq: Every day | ORAL | 3 refills | Status: DC

## 2017-08-13 NOTE — Assessment & Plan Note (Signed)
Ongoing issue for pt.  Suspect this is due to her ongoing anxiety/depression.  Will hold on adjusting sleep meds as we are working on her anxiety/depression meds.  Pt expressed understanding and is in agreement w/ plan.
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
## 2017-08-13 NOTE — Assessment & Plan Note (Signed)
Sertraline is not effective and pt is really struggling w/ counseling.  Will switch Sertraline to Prozac as pt feels she needs more energy and this is more activating.  Will follow closely to make sure mood improves.  Pt expressed understanding and is in agreement w/ plan.

## 2017-08-13 NOTE — Patient Instructions (Signed)
Follow up in 4-6 weeks to recheck mood We'll notify you of your lab results and make any changes if needed STOP the Sertraline START the Fluoxetine daily (Prozac) Continue to work on healthy diet and regular exercise- good for stress relief!! Continue the Trazodone at night Call with any questions or concerns Hang in there!!!

## 2017-08-13 NOTE — Assessment & Plan Note (Signed)
Chronic problem.  Check labs to risk stratify.  Stressed need for healthy diet and regular exercise.  Will follow.

## 2017-08-14 ENCOUNTER — Encounter: Payer: Self-pay | Admitting: General Practice

## 2017-08-14 LAB — CBC WITH DIFFERENTIAL/PLATELET
BASOS ABS: 42 {cells}/uL (ref 0–200)
Basophils Relative: 0.5 %
EOS ABS: 83 {cells}/uL (ref 15–500)
EOS PCT: 1 %
HEMATOCRIT: 34.8 % — AB (ref 35.0–45.0)
HEMOGLOBIN: 11 g/dL — AB (ref 11.7–15.5)
LYMPHS ABS: 1619 {cells}/uL (ref 850–3900)
MCH: 25.9 pg — ABNORMAL LOW (ref 27.0–33.0)
MCHC: 31.6 g/dL — AB (ref 32.0–36.0)
MCV: 82.1 fL (ref 80.0–100.0)
MPV: 10.3 fL (ref 7.5–12.5)
Monocytes Relative: 7.4 %
NEUTROS ABS: 5943 {cells}/uL (ref 1500–7800)
Neutrophils Relative %: 71.6 %
Platelets: 353 10*3/uL (ref 140–400)
RBC: 4.24 10*6/uL (ref 3.80–5.10)
RDW: 14.1 % (ref 11.0–15.0)
Total Lymphocyte: 19.5 %
WBC mixed population: 614 cells/uL (ref 200–950)
WBC: 8.3 10*3/uL (ref 3.8–10.8)

## 2017-08-14 LAB — BASIC METABOLIC PANEL
BUN: 9 mg/dL (ref 7–25)
CHLORIDE: 104 mmol/L (ref 98–110)
CO2: 25 mmol/L (ref 20–32)
Calcium: 9.3 mg/dL (ref 8.6–10.2)
Creat: 0.76 mg/dL (ref 0.50–1.10)
Glucose, Bld: 94 mg/dL (ref 65–99)
POTASSIUM: 4.2 mmol/L (ref 3.5–5.3)
SODIUM: 138 mmol/L (ref 135–146)

## 2017-08-14 LAB — HEPATIC FUNCTION PANEL
AG RATIO: 1 (calc) (ref 1.0–2.5)
ALKALINE PHOSPHATASE (APISO): 110 U/L (ref 33–115)
ALT: 9 U/L (ref 6–29)
AST: 15 U/L (ref 10–30)
Albumin: 3.8 g/dL (ref 3.6–5.1)
BILIRUBIN TOTAL: 0.2 mg/dL (ref 0.2–1.2)
Bilirubin, Direct: 0 mg/dL (ref 0.0–0.2)
Globulin: 3.8 g/dL (calc) — ABNORMAL HIGH (ref 1.9–3.7)
Indirect Bilirubin: 0.2 mg/dL (calc) (ref 0.2–1.2)
Total Protein: 7.6 g/dL (ref 6.1–8.1)

## 2017-08-14 LAB — HEMOGLOBIN A1C
EAG (MMOL/L): 6 (calc)
Hgb A1c MFr Bld: 5.4 % of total Hgb (ref <5.7)
Mean Plasma Glucose: 108 (calc)

## 2017-08-14 LAB — LIPID PANEL
Cholesterol: 170 mg/dL (ref <200)
HDL: 53 mg/dL (ref >50)
LDL Cholesterol (Calc): 101 mg/dL (calc) — ABNORMAL HIGH
Non-HDL Cholesterol (Calc): 117 mg/dL (calc) (ref <130)
TRIGLYCERIDES: 75 mg/dL (ref <150)
Total CHOL/HDL Ratio: 3.2 (calc) (ref <5.0)

## 2017-08-14 LAB — TSH: TSH: 1.15 mIU/L

## 2017-09-05 ENCOUNTER — Encounter: Payer: Self-pay | Admitting: Family Medicine

## 2017-09-05 ENCOUNTER — Other Ambulatory Visit: Payer: Self-pay

## 2017-09-05 ENCOUNTER — Ambulatory Visit: Payer: BC Managed Care – PPO | Admitting: Family Medicine

## 2017-09-05 VITALS — BP 124/90 | HR 93 | Temp 98.0°F | Resp 16 | Ht 64.0 in | Wt 288.5 lb

## 2017-09-05 DIAGNOSIS — M25562 Pain in left knee: Secondary | ICD-10-CM | POA: Diagnosis not present

## 2017-09-05 DIAGNOSIS — F419 Anxiety disorder, unspecified: Secondary | ICD-10-CM

## 2017-09-05 MED ORDER — FLUOXETINE HCL 40 MG PO CAPS: 40.0000 mg | ORAL_CAPSULE | Freq: Every day | ORAL | 3 refills | Status: DC

## 2017-09-05 MED ORDER — MELOXICAM 15 MG PO TABS: 15.0000 mg | ORAL_TABLET | Freq: Every day | ORAL | 1 refills | Status: DC

## 2017-09-05 NOTE — Progress Notes (Signed)
   Subjective:    Patient ID: Krystal Harris, female    DOB: June 07, 1986, 31 y.o.   MRN: [id number]  HPI Anxiety- pt was switched from Sertraline to Prozac at last visit.  Pt reports she is able to fall asleep 'much faster now' when using the Trazodone and Prozac.  Pt reports increased irritability lately.  Taking Prozac in the AM.  Pt is interested in increasing the dose.  L knee- pt is in a bowling league and she is not sure if she injured it but for the past 2 weeks it is 'very painful'.  Unable to go up steps w/ L leg as lead leg.  Painful to get out of car, walk.  Pain is anterior.  No relief w/ ibuprofen or icy hot.  No swelling.   Review of Systems For ROS see HPI     Objective:   Physical Exam  Constitutional: She is oriented to person, place, and time. She appears well-developed and well-nourished. No distress.  obese  Musculoskeletal: She exhibits tenderness (TTP over L anterior knee at lateral joint line, patella, patella tendon). She exhibits no edema.  Pain w/ full extension of L knee  Neurological: She is alert and oriented to person, place, and time.  Skin: Skin is warm and dry.  Psychiatric: She has a normal mood and affect. Her behavior is normal. Thought content normal.  Vitals reviewed.         Assessment & Plan:  L knee pain- new.  Suspect patellar tendonitis.  Start scheduled NSAID.  Refer to Sports Med for complete evaluation/treatment.  Pt expressed understanding and is in agreement w/ plan.

## 2017-09-05 NOTE — Assessment & Plan Note (Signed)
sxs have improved since switching to Prozac from Sertraline.  Pt still feels there is room for improvement so will increase to 40mg  daily and continue to follow closely.  Pt expressed understanding and is in agreement w/ plan.

## 2017-09-05 NOTE — Patient Instructions (Signed)
Follow up in 4-6 weeks to recheck mood/anxiety Increase the Prozac to 40mg  daily- 2 of what you have at home and 1 of the new prescription Start the Meloxicam once daily- w/ food- for the knee pain.  Don't add any extra ibuprofen or Aleve but you can take Tylenol for pain ICE! We'll call you with your Sports Med appt Call with any questions or concerns Happy Thanksgiving!!!

## 2017-09-12 ENCOUNTER — Ambulatory Visit (INDEPENDENT_AMBULATORY_CARE_PROVIDER_SITE_OTHER): Payer: BC Managed Care – PPO

## 2017-09-12 ENCOUNTER — Ambulatory Visit: Payer: BC Managed Care – PPO | Admitting: Sports Medicine

## 2017-09-12 ENCOUNTER — Encounter: Payer: Self-pay | Admitting: Sports Medicine

## 2017-09-12 VITALS — BP 118/86 | HR 100 | Ht 64.0 in | Wt 295.0 lb

## 2017-09-12 DIAGNOSIS — M170 Bilateral primary osteoarthritis of knee: Secondary | ICD-10-CM

## 2017-09-12 DIAGNOSIS — M25562 Pain in left knee: Secondary | ICD-10-CM

## 2017-09-12 DIAGNOSIS — M179 Osteoarthritis of knee, unspecified: Secondary | ICD-10-CM | POA: Insufficient documentation

## 2017-09-12 DIAGNOSIS — M1710 Unilateral primary osteoarthritis, unspecified knee: Secondary | ICD-10-CM | POA: Insufficient documentation

## 2017-09-12 MED ORDER — DICLOFENAC SODIUM 2 % TD SOLN
1.0000 | Freq: Two times a day (BID) | TRANSDERMAL | 2 refills | Status: DC
Start: 2017-09-12 — End: 2018-05-09

## 2017-09-12 MED ORDER — DICLOFENAC SODIUM 2 % TD SOLN: 1.0000 "application " | Freq: Two times a day (BID) | TRANSDERMAL | 0 refills | Status: AC

## 2017-09-12 NOTE — Procedures (Signed)
PROCEDURE NOTE: THERAPEUTIC EXERCISES (97110) 15 minutes spent for Therapeutic exercises as below and as referenced in the AVS. This included exercises focusing on stretching, strengthening, with significant focus on eccentric aspects.  Proper technique shown and discussed handout in great detail with ATC. All questions were discussed and answered.   Long term goals include an improvement in range of motion, strength, endurance as well as avoiding reinjury. Frequency of visits is one time as determined during today's  office visit. Frequency of exercises to be performed is as per handout.  EXERCISES REVIEWED:  Hip abduction strengthening  VMO strengthening

## 2017-09-12 NOTE — Patient Instructions (Signed)
Please perform the exercise program that we have prepared for you and gone over in detail on a daily basis.  In addition to the handout you were provided you can access your program through: [email or web address]   Your unique program code is: [email or web address]

## 2017-09-12 NOTE — Assessment & Plan Note (Signed)
Very mild early degenerative spurring bilateral medial compartments with slight lateral tilt to the patella Discussed the importance of weight maintenance and therapeutic exercises.  Topical Pennsaid provided Follow-up 6 weeks for reevaluation and can consider further diagnostic evaluation at that time

## 2017-09-12 NOTE — Progress Notes (Signed)
Krystal Harris. Krystal Harris, [place] [facility] [place] - 31 y.o. female MRN [id number]  Date of birth: June 13, 1986   Scribe for today's visit: Josepha Pigg, CMA    SUBJECTIVE:  Krystal Harris is here for New Patient (Initial Visit) (LT knee pain) referred by Dr. Birdie Riddle Her anterior LT knee symptoms INITIALLY: Began 3 weeks ago and while bowling; she is in a [employer], [employer] (consulting). She hasn't noticed any swelling around the knee. She does feeling popping and grinding in the knee.  Described as severe aching with activity and bending, nonradiating Worsened with going up stairs with LT leg leading, getting out of the car, walking.  Improved with resting and staying off of her feet.  Additional associated symptoms include: no associated pains in back, hip, or ankle.     At this time symptoms are worsening compared to onset until she started taking prescribed medications. Sx are constant.  She has been taking Ibuprofen and using [facility] with minimal relief. PCP prescribed Meloxicam and recommended using ice and taking Tylenol prn. She has had some relief with these therapies.   No recent imaging of the knee   ROS Denies night time disturbances. Denies fevers, chills, or night sweats. Denies unexplained weight loss. Denies personal history of cancer. Denies changes in bowel or bladder habits. Reports recent unreported falls. Slipped and fell on stairs when knee gave out.  Denies new or worsening dyspnea or wheezing. Denies headaches or dizziness.  Denies numbness, tingling or weakness  In the extremities.  Denies dizziness or presyncopal episodes Denies lower extremity edema    HISTORY & PERTINENT PRIOR DATA:  Prior History reviewed and updated per electronic medical record. Significant history, findings, studies and interim changes include: No additional findings.  reports that  has never smoked. she has never used smokeless  tobacco. Recent Labs    08/13/17 1549  HGBA1C 5.4   Problem  Knee Osteoarthritis   X-ray 09/12/2017: Very mild but has really degenerative spurring of bilateral medial compartments      OBJECTIVE:  VS:  HT:5\' 4"  (162.6 cm)   WT:295 lb (133.8 kg)  BMI:50.61    BP:118/86  HR:100bpm  TEMP: ( )  RESP:99 %  PHYSICAL EXAM: Constitutional: WDWN, Non-toxic appearing. Psychiatric: Alert & appropriately interactive.Not depressed or anxious appearing. Respiratory: No increased work of breathing. Trachea Midline Eyes: Pupils are equal. EOM intact without nystagmus. No scleral icterus   VASCULAR FINDINGS: No clubbing or cyanosis appreciated Capillary Refill is normal, less than 2 seconds LOWER EXTREMITIES: No significant LE venous stasis changes, No significant pretibial/lower extremity peripheral edema and Bilateral Pedal Pulses: symmetrically palpable  SENSORY FINDINGS: LOWER EXTREMITIES: Intact to light touch in all examined dermatomes  Bilateral Knee:  Very large soft tissue envelope however joint is overall well aligned without deformity..    No significant effusion.    ROM: 0 to 120.    Extensor mechanism intact  Generalized medial and lateral joint line tenderness, medial greater than lateral.  Mild pain over the patellar tendon.    Stable to varus/valgus strain & anterior/posterior drawer.      Pain with Thessaly.  No pain with McMurray's.  Positive patellar grind.    ASSESSMENT & PLAN:   1. Acute pain of left knee   2. Morbid obesity ([place])   3. Primary osteoarthritis of both knees    Plan:     Knee osteoarthritis Very mild early degenerative spurring  bilateral medial compartments with slight lateral tilt to the patella Discussed the importance of weight maintenance and therapeutic exercises.  Topical Pennsaid provided Follow-up 6 weeks for reevaluation and can consider further diagnostic evaluation at that  time   ++++++++++++++++++++++++++++++++++++++++++++ Orders:  Orders Placed This Encounter  Procedures  . Misc procedure  . DG Knee AP/LAT W/Sunrise Left    Meds:  Meds ordered this encounter  Medications  . Diclofenac Sodium (PENNSAID) 2 % SOLN    Sig: Place 1 application onto the skin 2 (two) times daily.    Dispense:  112 g    Refill:  2    Home Phone      [phone] Work Phone      [phone] Mobile          [phone]   . Diclofenac Sodium (PENNSAID) 2 % SOLN    Sig: Place 1 application onto the skin 2 (two) times daily for 1 day.    Dispense:  8 g    Refill:  0    ++++++++++++++++++++++++++++++++++++++++++++ Follow-up: Return in about 6 weeks (around 10/24/2017).   Pertinent documentation may be included in additional procedure notes, imaging studies, problem based documentation and patient instructions. Please see these sections of the encounter for additional information regarding this visit. CMA/ATC served as Education administrator during this visit. History, Physical, and Plan performed by medical provider. Documentation and orders reviewed and attested to.      Gerda Diss, [facility] Physician

## 2017-10-17 ENCOUNTER — Ambulatory Visit: Payer: BC Managed Care – PPO | Admitting: Family Medicine

## 2017-10-17 ENCOUNTER — Encounter: Payer: Self-pay | Admitting: Family Medicine

## 2017-10-17 ENCOUNTER — Other Ambulatory Visit (HOSPITAL_COMMUNITY)
Admission: RE | Admit: 2017-10-17 | Discharge: 2017-10-17 | Disposition: A | Discharge status: Home / Self Care | Payer: BC Managed Care – PPO | Source: Ambulatory Visit | Attending: Family Medicine | Admitting: Family Medicine

## 2017-10-17 ENCOUNTER — Other Ambulatory Visit: Payer: Self-pay

## 2017-10-17 VITALS — BP 122/82 | HR 76 | Temp 98.7°F | Resp 16 | Ht 64.0 in | Wt 290.2 lb

## 2017-10-17 DIAGNOSIS — R400 Somnolence: Secondary | ICD-10-CM

## 2017-10-17 DIAGNOSIS — R300 Dysuria: Secondary | ICD-10-CM

## 2017-10-17 DIAGNOSIS — F419 Anxiety disorder, unspecified: Secondary | ICD-10-CM | POA: Diagnosis not present

## 2017-10-17 LAB — POCT URINALYSIS DIPSTICK
BILIRUBIN UA: NEGATIVE
GLUCOSE UA: NEGATIVE
KETONES UA: NEGATIVE
Leukocytes, UA: NEGATIVE
Nitrite, UA: NEGATIVE
PH UA: 6 (ref 5.0–8.0)
Protein, UA: NEGATIVE
RBC UA: NEGATIVE
SPEC GRAV UA: 1.015 (ref 1.010–1.025)
UROBILINOGEN UA: 0.2 U/dL

## 2017-10-17 MED ORDER — CIPROFLOXACIN HCL 500 MG PO TABS: 500.0000 mg | ORAL_TABLET | Freq: Two times a day (BID) | ORAL | 0 refills | Status: DC

## 2017-10-17 NOTE — Progress Notes (Signed)
   Subjective:    Patient ID: Krystal Harris, female    DOB: 03/30/1986, 32 y.o.   MRN: [id number]  HPI Anxiety/depression- pt was switched from Sertraline to Prozac at last visit.  Pt reports feeling 'better'.  Not requiring the Trazodone nightly like she was before.  Less irritable than before.  No longer tearful.  Sleeping well at night.  Not having daily daytime sleepiness but at times requires an afternoon nap.  Dysuria- sxs started ~1 week ago.  Has increased water and cranberry juice intake.  Increased frequency, urgency.  sxs started after intercourse.   Review of Systems For ROS see HPI     Objective:   Physical Exam  Constitutional: She is oriented to person, place, and time. She appears well-developed and well-nourished. No distress.  HENT:  Head: Normocephalic and atraumatic.  Eyes: Conjunctivae and EOM are normal. Pupils are equal, round, and reactive to light.  Neck: Normal range of motion. Neck supple. No thyromegaly present.  Cardiovascular: Normal rate, regular rhythm, normal heart sounds and intact distal pulses.  No murmur heard. Pulmonary/Chest: Effort normal and breath sounds normal. No respiratory distress.  Abdominal: Soft. She exhibits no distension. There is no tenderness.  Musculoskeletal: She exhibits no edema.  Lymphadenopathy:    She has no cervical adenopathy.  Neurological: She is alert and oriented to person, place, and time.  Skin: Skin is warm and dry.  Psychiatric: She has a normal mood and affect. Her behavior is normal.  Vitals reviewed.         Assessment & Plan:  Daytime sleepiness- ongoing issue.  Reports sleepiness is not daily but tends to occur when she doesn't take the Trazodone.  Given morbid obesity, concern for sleep apnea.  Refer to pulmonary for complete evaluation.  Pt expressed understanding and is in agreement w/ plan.   Dysuria- new.  Occurred after intercourse.  sxs are classic for UTI despite negative UA.  Will treat  prophylactically w/ abx.

## 2017-10-17 NOTE — Patient Instructions (Addendum)
Schedule your complete physical in 6 months We'll call you with your pulmonary appt to assess for sleep apnea Start the Cipro twice daily for likely UTI Drink plenty of fluids Keep up the good work on healthy diet and regular exercise- you're doing great! Call with any questions or concerns Happy New Year!!!

## 2017-10-17 NOTE — Addendum Note (Signed)
Addended by: Katina Dung on: 10/17/2017 09:48 AM   Modules accepted: Orders

## 2017-10-17 NOTE — Addendum Note (Signed)
Addended by: Davis Gourd on: 10/17/2017 09:25 AM   Modules accepted: Orders

## 2017-10-17 NOTE — Assessment & Plan Note (Signed)
Much improved since switching to Prozac.  Mood and irritability are much better.  No med changes at this time.  Will follow.

## 2017-10-18 LAB — URINE CYTOLOGY ANCILLARY ONLY
CHLAMYDIA, DNA PROBE: NEGATIVE
NEISSERIA GONORRHEA: NEGATIVE

## 2017-10-18 LAB — URINE CULTURE
MICRO NUMBER:: 90003608
SPECIMEN QUALITY:: ADEQUATE

## 2017-10-24 ENCOUNTER — Ambulatory Visit: Payer: BC Managed Care – PPO | Admitting: Sports Medicine

## 2017-10-31 ENCOUNTER — Ambulatory Visit: Payer: BC Managed Care – PPO | Admitting: Sports Medicine

## 2017-10-31 DIAGNOSIS — Z0289 Encounter for other administrative examinations: Secondary | ICD-10-CM

## 2017-11-12 ENCOUNTER — Encounter: Payer: BC Managed Care – PPO | Admitting: Podiatry

## 2017-11-19 ENCOUNTER — Encounter: Payer: Self-pay | Admitting: Family Medicine

## 2017-11-19 ENCOUNTER — Ambulatory Visit: Payer: BC Managed Care – PPO | Admitting: Family Medicine

## 2017-11-19 ENCOUNTER — Other Ambulatory Visit: Payer: Self-pay

## 2017-11-19 VITALS — BP 123/86 | HR 82 | Temp 98.4°F | Resp 17 | Ht 64.0 in | Wt 291.5 lb

## 2017-11-19 DIAGNOSIS — J069 Acute upper respiratory infection, unspecified: Secondary | ICD-10-CM

## 2017-11-19 MED ORDER — ALBUTEROL SULFATE HFA 108 (90 BASE) MCG/ACT IN AERS: 2.0000 | INHALATION_SPRAY | Freq: Four times a day (QID) | RESPIRATORY_TRACT | 2 refills | Status: DC | PRN

## 2017-11-19 MED ORDER — BENZONATATE 200 MG PO CAPS: 200.0000 mg | ORAL_CAPSULE | Freq: Three times a day (TID) | ORAL | 0 refills | Status: DC | PRN

## 2017-11-19 MED ORDER — GUAIFENESIN-CODEINE 100-10 MG/5ML PO SYRP: 10.0000 mL | ORAL_SOLUTION | Freq: Three times a day (TID) | ORAL | 0 refills | Status: DC | PRN

## 2017-11-19 NOTE — Progress Notes (Signed)
This encounter was created in error - please disregard.

## 2017-11-19 NOTE — Assessment & Plan Note (Signed)
New.  No evidence of bacterial infxn on PE so no need for abx.  Most likely viral bronchitis.  Will treat supportively w/ cough meds and albuterol prn given hx of childhood asthma.  Reviewed supportive care and red flags that should prompt return.  Pt expressed understanding and is in agreement w/ plan.

## 2017-11-19 NOTE — Progress Notes (Signed)
   Subjective:    Patient ID: Krystal Harris, female    DOB: 05-13-1986, 32 y.o.   MRN: [id number]  HPI URI- + sick contacts.  sxs started 3 days ago.  sxs started w/ sore throat and then developed deep cough.  + chest tightness and pain due to cough.  + HA.  No sinus pain/pressure.  + nasal drainage.  No fevers.  No body aches.  Hx of childhood asthma.   Review of Systems For ROS see HPI     Objective:   Physical Exam  Constitutional: She is oriented to person, place, and time. She appears well-developed and well-nourished. No distress.  HENT:  Head: Normocephalic and atraumatic.  TMs normal bilaterally Mild nasal congestion Throat w/out erythema, edema, or exudate  Eyes: Conjunctivae and EOM are normal. Pupils are equal, round, and reactive to light.  Neck: Normal range of motion. Neck supple.  Cardiovascular: Normal rate, regular rhythm, normal heart sounds and intact distal pulses.  No murmur heard. Pulmonary/Chest: Effort normal and breath sounds normal. No respiratory distress. She has no wheezes.  + hacking cough  Lymphadenopathy:    She has no cervical adenopathy.  Neurological: She is alert and oriented to person, place, and time.  Vitals reviewed.         Assessment & Plan:

## 2017-11-19 NOTE — Patient Instructions (Signed)
Follow up as needed or as scheduled Use the Tessalon as needed for daytime cough- can combine w/ Mucinex DM Use the cough syrup for nights/weekends- may cause drowsiness Drink plenty of fluids REST! Use the Albuterol inhaler as needed for cough, shortness of breath, or wheezing Call with any questions or concerns Hang in there!!!

## 2017-11-21 ENCOUNTER — Institutional Professional Consult (permissible substitution): Payer: BC Managed Care – PPO | Admitting: Internal Medicine

## 2017-11-22 ENCOUNTER — Encounter: Payer: Self-pay | Admitting: Sports Medicine

## 2017-11-23 ENCOUNTER — Institutional Professional Consult (permissible substitution): Payer: BC Managed Care – PPO | Admitting: Pulmonary Disease

## 2017-12-10 ENCOUNTER — Other Ambulatory Visit: Payer: Self-pay | Admitting: Family Medicine

## 2017-12-25 ENCOUNTER — Institutional Professional Consult (permissible substitution): Payer: BC Managed Care – PPO | Admitting: Pulmonary Disease

## 2018-01-07 ENCOUNTER — Other Ambulatory Visit: Payer: Self-pay | Admitting: Family Medicine

## 2018-01-22 ENCOUNTER — Encounter: Payer: Self-pay | Admitting: Family Medicine

## 2018-01-22 ENCOUNTER — Other Ambulatory Visit: Payer: Self-pay

## 2018-01-22 ENCOUNTER — Ambulatory Visit: Payer: BC Managed Care – PPO | Admitting: Family Medicine

## 2018-01-22 VITALS — BP 118/70 | HR 87 | Temp 98.1°F | Resp 16 | Ht 64.0 in | Wt 290.6 lb

## 2018-01-22 DIAGNOSIS — F419 Anxiety disorder, unspecified: Secondary | ICD-10-CM | POA: Diagnosis not present

## 2018-01-22 MED ORDER — BUPROPION HCL 75 MG PO TABS: 75.0000 mg | ORAL_TABLET | Freq: Two times a day (BID) | ORAL | 3 refills | Status: DC

## 2018-01-22 NOTE — Patient Instructions (Signed)
Follow up in 3-4 weeks to recheck mood CONTINUE the Fluoxetine (Prozac) daily ADD the Wellbutrin (Bupropion) twice daily for improved mood, energy, motivation, metabolism Continue to work on healthy diet and regular exercise- you can do it!! Call with any questions or concerns Good luck getting things straightened out with [facility]!!! Hang in there!  We'll get this figured out!

## 2018-01-22 NOTE — Progress Notes (Signed)
   Subjective:    Patient ID: Krystal Harris, female    DOB: December 15, 1985, 32 y.o.   MRN: [id number]  HPI Anxiety- pt is back in school, working, 'there are different transitions going on.  It's a lot right now.'  Sunday was very tearful.  Yesterday had very decreased energy.  When asked about it, she started crying again.  Pt is in counseling.  On Prozac 40mg  daily.  Pt feels that energy level corresponds to mood.  Those around her have noticed a mood change and keep asking if she's ok or 'what's wrong?'.  sxs worsened ~2 weeks ago.  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    Obesity- pt reports decreased energy and motivation recently which makes exercising difficult.   Review of Systems For ROS see HPI     Objective:   Physical Exam  Constitutional: She is oriented to person, place, and time. She appears well-developed and well-nourished. No distress.  HENT:  Head: Normocephalic and atraumatic.  Eyes: Pupils are equal, round, and reactive to light. Conjunctivae and EOM are normal.  Neck: Normal range of motion. Neck supple. No thyromegaly present.  Cardiovascular: Normal rate, regular rhythm, normal heart sounds and intact distal pulses.  No murmur heard. Pulmonary/Chest: Effort normal and breath sounds normal. No respiratory distress.  Abdominal: Soft. She exhibits no distension. There is no tenderness.  Musculoskeletal: She exhibits no edema.  Lymphadenopathy:    She has no cervical adenopathy.  Neurological: She is alert and oriented to person, place, and time.  Skin: Skin is warm and dry.  Psychiatric: She has a normal mood and affect. Her behavior is normal.  Vitals reviewed.         Assessment & Plan:

## 2018-01-22 NOTE — Assessment & Plan Note (Signed)
Ongoing issue.  Pt wants to lose weight- joined a challenge at work.  Having a hard time w/ decreased energy and motivation.  Will start Wellbutrin to both improve mood and boost metabolism.  Will follow closely.
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
## 2018-02-11 ENCOUNTER — Other Ambulatory Visit: Payer: Self-pay | Admitting: General Practice

## 2018-02-11 MED ORDER — TRAZODONE HCL 50 MG PO TABS: ORAL_TABLET | ORAL | 0 refills | Status: DC

## 2018-02-14 ENCOUNTER — Telehealth: Payer: Self-pay | Admitting: Family Medicine

## 2018-02-14 ENCOUNTER — Ambulatory Visit: Payer: Self-pay | Admitting: *Deleted

## 2018-02-14 NOTE — Telephone Encounter (Signed)
Several attempts made to reach patient to reschedule to earlier appt tomorrow. Tried all different numbers listed. Left VM on mobile number.

## 2018-02-14 NOTE — Telephone Encounter (Signed)
Pt. Had called to report low heart rate. Call was dropped before triage could talk to pt. Message left for her to call back and discuss symptoms.

## 2018-02-14 NOTE — Telephone Encounter (Signed)
Pt reports "Low heart rate." Onset 01/29/18, 1 week after starting Wellbutrin on 01/22/18. States "Occurs only once a day, different times then goes back up and stays up."  Reports rates of 42-46, but brief.  During call pt at work, HR 109.  States had been outside with children .Beginning of call pt at rest, HR 65. States mild lightheadedness at times. Appt made with Dr. Birdie Riddle for tomorrow, requested late appt.at 1600.  Care advise given; instructed pt to call back for sustained low heart rate, any increased lightheadedness, SOB, CP. Pt at work, irritable affect, anxious to end call.  Add: Spoke with [place] at practice. States pt should be seen earlier in day. Attempted to call back patient. Left VM to return call to reschedule appt.. Reason for Disposition . Heart beating very slowly (e.g., < 50 / minute)  (Exception: athlete)    "Once a day only" per patient report. Onset 2 weeks ago.  Answer Assessment - Initial Assessment Questions 1. DESCRIPTION: "Please describe your heart rate or heart beat that you are having" (e.g., fast/slow, regular/irregular, skipped or extra beats, "palpitations")     "Slow once a day, then comes up." Range of 42-46,brief 2. ONSET: "When did it start?" (Minutes, hours or days)      1 week after starting Wellbutrin 3. DURATION: "How long does it last" (e.g., seconds, minutes, hours)     Briefly, "Next time I check it is back up." 4. PATTERN "Does it come and go, or has it been constant since it started?"  "Does it get worse with exertion?"   "Are you feeling it now?"     No, not presently. 5. TAP: "Using your hand, can you tap out what you are feeling on a chair or table in front of you, so that I can hear?" (Note: not all patients can do this)       Not done 6. HEART RATE: "Can you tell me your heart rate?" "How many beats in 15 seconds?"  (Note: not all patients can do this)       109 ; at work, outside with kids. 7. RECURRENT SYMPTOM: "Have you ever had this  before?" If so, ask: "When was the last time?" and "What happened that time?"      no 8. CAUSE: "What do you think is causing the palpitations?"     Denies palpitations; thinks from Wellbutrin 9. CARDIAC HISTORY: "Do you have any history of heart disease?" (e.g., heart attack, angina, bypass surgery, angioplasty, arrhythmia)      no 10. OTHER SYMPTOMS: "Do you have any other symptoms?" (e.g., dizziness, chest pain, sweating, difficulty breathing)       Mild lightheadedness at times  Protocols used: [place]

## 2018-02-15 ENCOUNTER — Other Ambulatory Visit: Payer: Self-pay

## 2018-02-15 ENCOUNTER — Encounter: Payer: Self-pay | Admitting: Family Medicine

## 2018-02-15 ENCOUNTER — Ambulatory Visit: Payer: BC Managed Care – PPO | Admitting: Family Medicine

## 2018-02-15 VITALS — BP 121/81 | HR 96 | Temp 98.1°F | Resp 16 | Ht 64.0 in | Wt 285.2 lb

## 2018-02-15 DIAGNOSIS — R001 Bradycardia, unspecified: Secondary | ICD-10-CM | POA: Diagnosis not present

## 2018-02-15 MED ORDER — BUPROPION HCL ER (SR) 100 MG PO TB12: 100.0000 mg | ORAL_TABLET | Freq: Every day | ORAL | 3 refills | Status: DC

## 2018-02-15 NOTE — Progress Notes (Signed)
   Subjective:    Patient ID: Krystal Harris, female    DOB: 12-31-1985, 32 y.o.   MRN: [id number]  HPI Bradycardia- pt reports her HR will drop at various times during the day.  Wears an apple watch and lowest reading was 44.  Pt will have some light headedness/unsteadiness when HR is low and also sometimes after 2nd dose of Wellbutrin.  Pt is unaware of HR unless she is dizzy.  No CP, SOB.  Pt reports on days when she has not taken her 2nd dose of Wellbutrin, her HR doesn't drop nearly as low- 60s or 70s.   Review of Systems For ROS see HPI     Objective:   Physical Exam  Constitutional: She is oriented to person, place, and time. She appears well-developed and well-nourished. No distress.  HENT:  Head: Normocephalic and atraumatic.  Eyes: Pupils are equal, round, and reactive to light. Conjunctivae and EOM are normal.  Neck: Normal range of motion. Neck supple. No thyromegaly present.  Cardiovascular: Normal rate, regular rhythm, normal heart sounds and intact distal pulses.  No murmur heard. Pulmonary/Chest: Effort normal and breath sounds normal. No respiratory distress.  Abdominal: Soft. She exhibits no distension. There is no tenderness.  Musculoskeletal: She exhibits no edema.  Lymphadenopathy:    She has no cervical adenopathy.  Neurological: She is alert and oriented to person, place, and time.  Skin: Skin is warm and dry.  Psychiatric: She has a normal mood and affect. Her behavior is normal.  Vitals reviewed.         Assessment & Plan:  Bradycardia- new.  No evidence of this on PE today and EKG WNL.  Pt feels this is directly related to her 2nd dose of Wellbutrin but I am not as certain.  In case it is related, will switch to SR to eliminate 2nd dose.  Pt is to continue to monitor her heart rate and sxs so we can pursue cardiac workup/holter monitor if needed.  Pt expressed understanding and is in agreement w/ plan.

## 2018-02-15 NOTE — Patient Instructions (Signed)
Follow up as scheduled next week to recheck HR and mood w/ new dose STOP the 75mg  twice daily START the 100mg  once daily Continue to monitor your HR periodically- but definitely if you're having symptoms (dizziness, shortness of breath, fatigue) Call with any questions or concerns Have a great weekend!!!

## 2018-02-21 ENCOUNTER — Ambulatory Visit: Payer: BC Managed Care – PPO | Admitting: Family Medicine

## 2018-02-22 ENCOUNTER — Ambulatory Visit: Payer: BC Managed Care – PPO | Admitting: Family Medicine

## 2018-02-24 ENCOUNTER — Encounter: Payer: Self-pay | Admitting: Family Medicine

## 2018-03-01 ENCOUNTER — Ambulatory Visit: Payer: BC Managed Care – PPO | Admitting: Family Medicine

## 2018-03-04 ENCOUNTER — Ambulatory Visit: Payer: BC Managed Care – PPO | Admitting: Family Medicine

## 2018-03-06 ENCOUNTER — Other Ambulatory Visit: Payer: Self-pay

## 2018-03-06 ENCOUNTER — Ambulatory Visit: Payer: BC Managed Care – PPO | Admitting: Family Medicine

## 2018-03-06 ENCOUNTER — Encounter: Payer: Self-pay | Admitting: Family Medicine

## 2018-03-06 VITALS — BP 126/81 | HR 76 | Temp 98.7°F | Resp 16 | Ht 64.0 in | Wt 290.2 lb

## 2018-03-06 DIAGNOSIS — R001 Bradycardia, unspecified: Secondary | ICD-10-CM | POA: Diagnosis not present

## 2018-03-06 DIAGNOSIS — F419 Anxiety disorder, unspecified: Secondary | ICD-10-CM

## 2018-03-06 NOTE — Assessment & Plan Note (Signed)
Improved since adding Wellbutrin to the Prozac.  Pt continues to have situational anxiety- which I reassured her was normal.  No med changes at this time.  Applauded her for reaching out for counseling.  Will follow closely.

## 2018-03-06 NOTE — Patient Instructions (Signed)
Follow up as scheduled We'll call you with the appt to get your holter monitor hooked up so we can monitor your heart rate for 48 hrs No med changes at this time Continue to work on healthy diet and regular exercise- you can do it!! Call with any questions or concerns [place]!!!

## 2018-03-06 NOTE — Progress Notes (Signed)
   Subjective:    Patient ID: Krystal Harris, female    DOB: [birth year], 30 y.o.   MRN: [id number]  HPI Anxiety- we changed Wellbutrin at last visit to 100mg  daily from the 75mg  BID.  Remains on Prozac 40mg  daily.  Pt feels 'the same'.  Pt had a 'very overwhelming' time at her graduation.  Started counseling yesterday- [facility], Krystal Harris.  HR continues to periodically drop low- pt's Apple Watch will record HR in low 50s and a couple of readings in the high 40s.  Has not been able to feel low HR since Saturday- was briefly dizzy.  Pt is interested in proceeding w/ holter monitor.   Review of Systems For ROS see HPI     Objective:   Physical Exam  Constitutional: She is oriented to person, place, and time. She appears well-developed and well-nourished. No distress.  obese  HENT:  Head: Normocephalic and atraumatic.  Cardiovascular: Normal rate, regular rhythm and normal heart sounds.  Pulmonary/Chest: Effort normal and breath sounds normal. No stridor. No respiratory distress. She has no wheezes. She has no rales.  Neurological: She is alert and oriented to person, place, and time.  Skin: Skin is warm and dry.  Psychiatric: She has a normal mood and affect. Her behavior is normal. Thought content normal.  Vitals reviewed.         Assessment & Plan:  Symptomatic bradycardia- pt continues to have low heart rate episodes that intermittently cause dizziness.  She no longer feels that this is due to her Wellbutrin.  Will get Holter Monitor to assess.  Pt expressed understanding and is in agreement w/ plan.

## 2018-03-17 IMAGING — CR DG LUMBAR SPINE COMPLETE 4+V
5 series · 5 of 5 positions shown · non-contrast
Comparison: None.

CLINICAL DATA: Low back pain, right worse than left, since a fall
from a hoverboard 2 days ago.

EXAM:
LUMBAR SPINE - COMPLETE 4+ VIEW

[t lumbar spine ap]
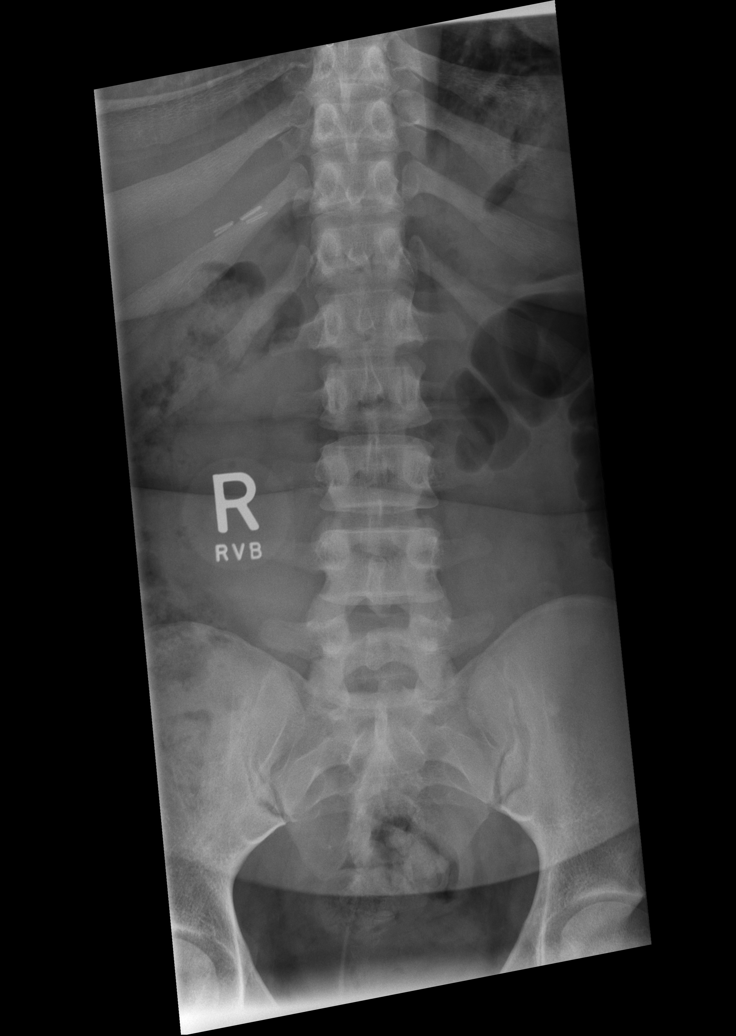

[t lumbar spine obl (1 of 2)]
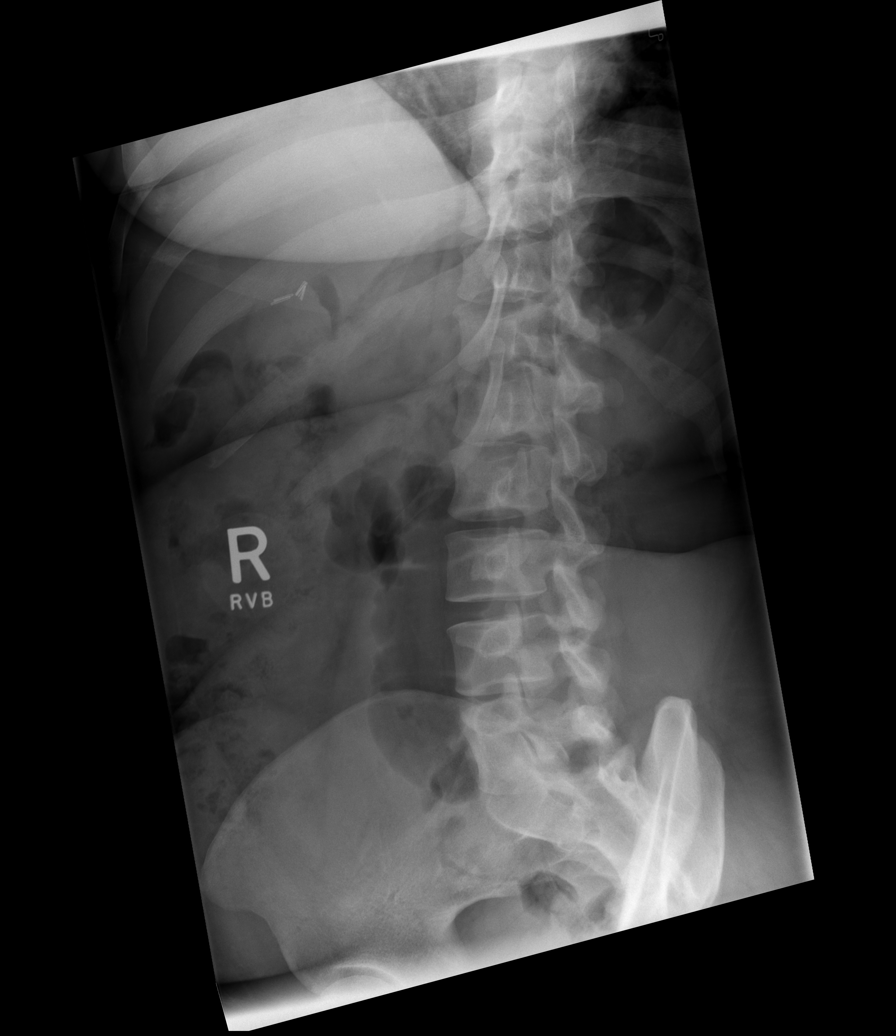

[t lumbar spine obl (2 of 2)]
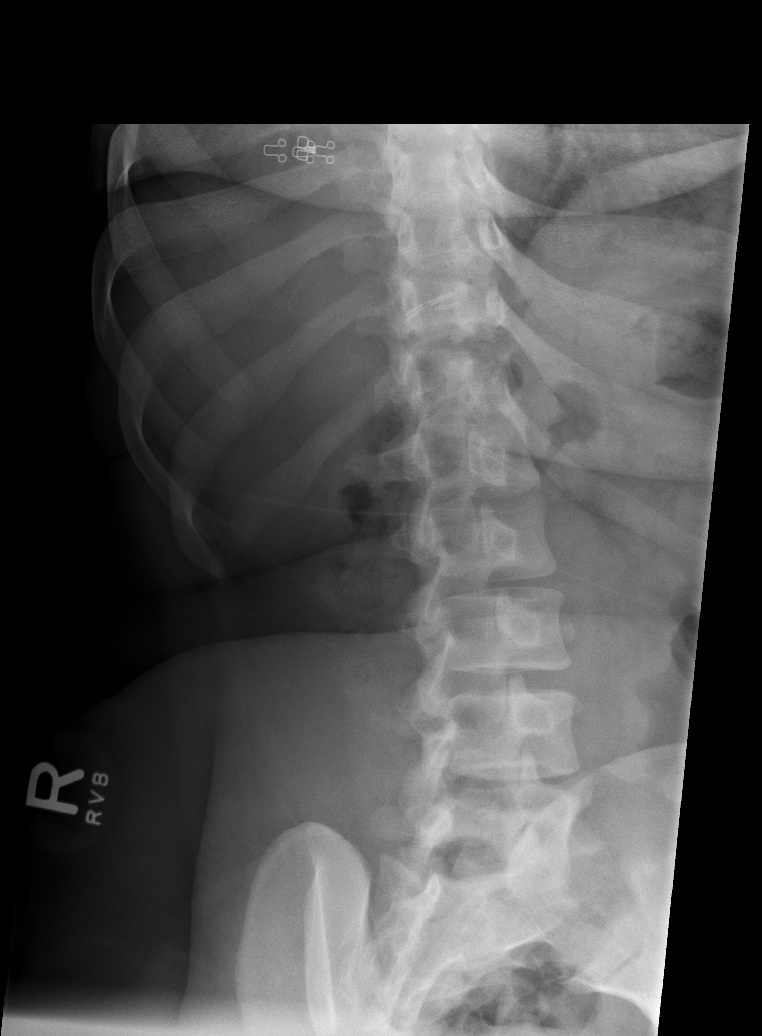

[t lumbar spine lat]
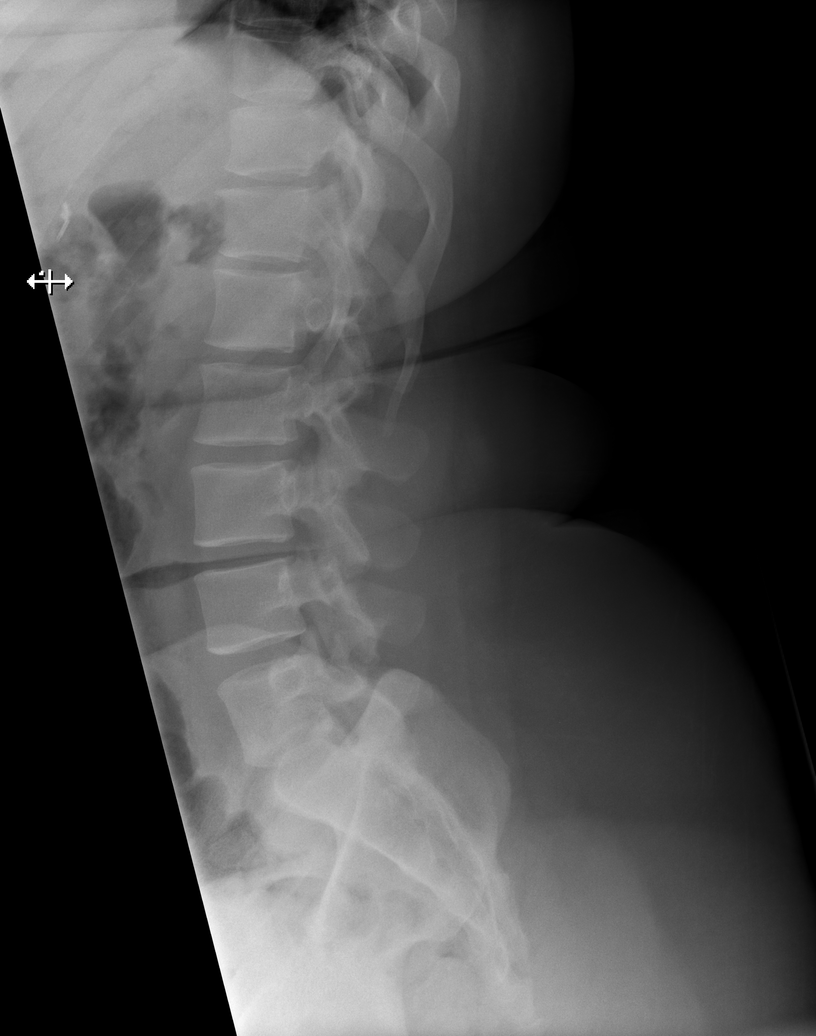

[t lumbar l-5 s-1 spot]
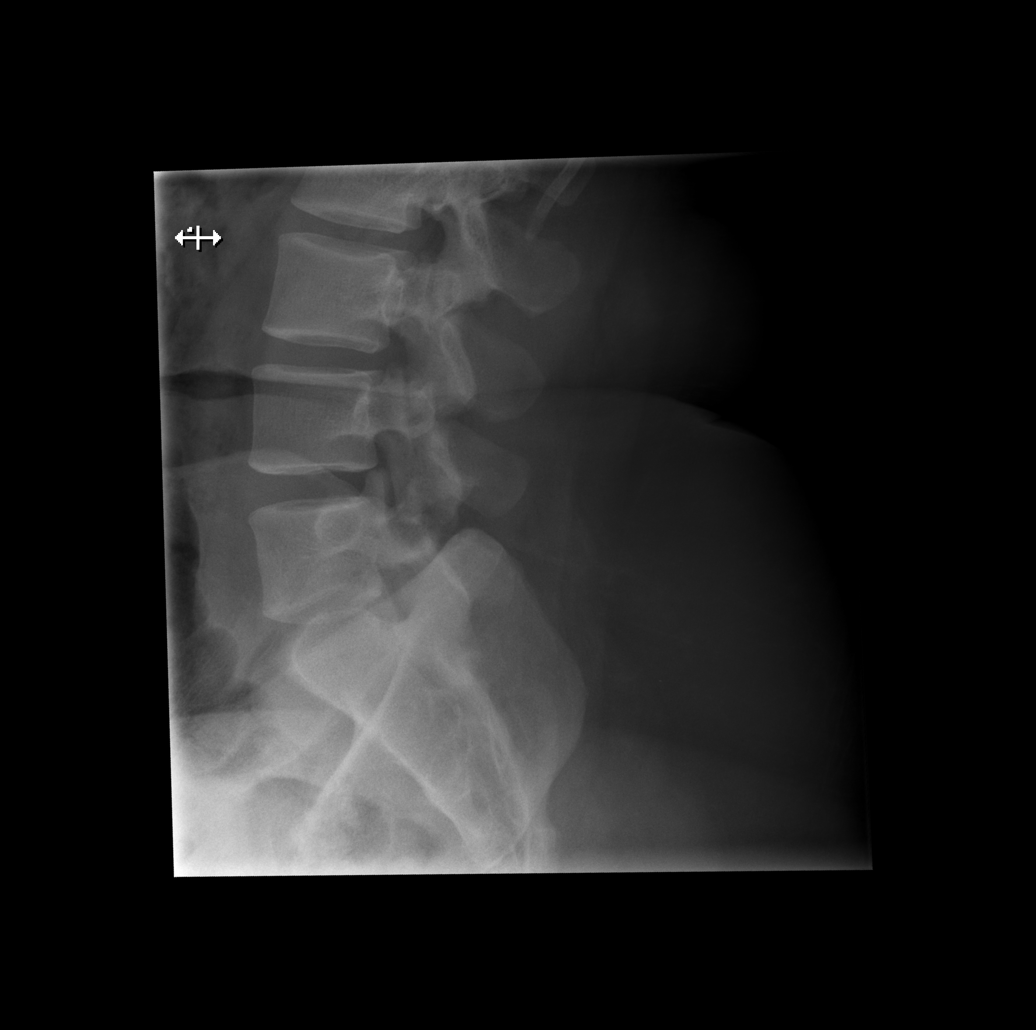

[5 of 5 positions shown; findings below may reference images not displayed]

FINDINGS: There is no evidence of lumbar spine fracture. Alignment is normal.
Intervertebral disc spaces are maintained.
IMPRESSION: Normal exam.

## 2018-03-18 ENCOUNTER — Ambulatory Visit (INDEPENDENT_AMBULATORY_CARE_PROVIDER_SITE_OTHER): Payer: BC Managed Care – PPO

## 2018-03-18 DIAGNOSIS — R001 Bradycardia, unspecified: Secondary | ICD-10-CM

## 2018-03-22 ENCOUNTER — Ambulatory Visit: Payer: BC Managed Care – PPO | Admitting: Family Medicine

## 2018-03-22 ENCOUNTER — Encounter: Payer: Self-pay | Admitting: Family Medicine

## 2018-03-22 ENCOUNTER — Other Ambulatory Visit: Payer: Self-pay

## 2018-03-22 VITALS — BP 122/84 | HR 103 | Temp 98.7°F | Resp 16 | Ht 64.25 in | Wt 293.6 lb

## 2018-03-22 DIAGNOSIS — Z0000 Encounter for general adult medical examination without abnormal findings: Secondary | ICD-10-CM

## 2018-03-22 NOTE — Progress Notes (Signed)
   Subjective:    Patient ID: Krystal Harris, female    DOB: 06/29/1986, 32 y.o.   MRN: [id number]  HPI CPE- UTD on pap, Tdap, flu.   Review of Systems Patient reports no vision/ hearing changes, adenopathy,fever, weight change,  persistant/recurrent hoarseness , swallowing issues, chest pain, palpitations, edema, persistant/recurrent cough, hemoptysis, dyspnea (rest/exertional/paroxysmal nocturnal), gastrointestinal bleeding (melena, rectal bleeding), abdominal pain, significant heartburn, bowel changes, GU symptoms (dysuria, hematuria, incontinence), Gyn symptoms (abnormal  bleeding, pain),  syncope, focal weakness, memory loss, numbness & tingling, skin/hair/nail changes, abnormal bruising or bleeding, anxiety, or depression.     Objective:   Physical Exam General Appearance:    Alert, cooperative, no distress, appears stated age, obese  Head:    Normocephalic, without obvious abnormality, atraumatic  Eyes:    PERRL, conjunctiva/corneas clear, EOM's intact, fundi    benign, both eyes  Ears:    Normal TM's and external ear canals, both ears  Nose:   Nares normal, septum midline, mucosa normal, no drainage    or sinus tenderness  Throat:   Lips, mucosa, and tongue normal; teeth and gums normal  Neck:   Supple, symmetrical, trachea midline, no adenopathy;    Thyroid: no enlargement/tenderness/nodules  Back:     Symmetric, no curvature, ROM normal, no CVA tenderness  Lungs:     Clear to auscultation bilaterally, respirations unlabored  Chest Wall:    No tenderness or deformity   Heart:    Regular rate and rhythm, S1 and S2 normal, no murmur, rub   or gallop  Breast Exam:    Deferred to GYN  Abdomen:     Soft, non-tender, bowel sounds active all four quadrants,    no masses, no organomegaly  Genitalia:    Deferred to GYN  Rectal:    Extremities:   Extremities normal, atraumatic, no cyanosis or edema  Pulses:   2+ and symmetric all extremities  Skin:   Skin color, texture, turgor  normal, no rashes or lesions  Lymph nodes:   Cervical, supraclavicular, and axillary nodes normal  Neurologic:   CNII-XII intact, normal strength, sensation and reflexes    throughout          Assessment & Plan:

## 2018-03-22 NOTE — Assessment & Plan Note (Signed)
Pt's PE WNL w/ exception of obesity.  UTD on GYN, immunizations.  Check labs.  Anticipatory guidance provided.  °

## 2018-03-22 NOTE — Assessment & Plan Note (Signed)
Ongoing issue for pt.  She is not exercising regularly bc she is back in school.  Check labs to risk stratify.  Will follow.

## 2018-03-22 NOTE — Patient Instructions (Signed)
Follow up in 6 months to recheck weight loss progress We'll notify you of your lab results and make any changes if needed Continue to work on healthy diet and regular exercise- you can do it! Call with any questions or concerns Have a great summer!!  

## 2018-03-23 LAB — CBC WITH DIFFERENTIAL/PLATELET
Basophils Absolute: 22 cells/uL (ref 0–200)
Basophils Relative: 0.3 %
EOS ABS: 122 {cells}/uL (ref 15–500)
EOS PCT: 1.7 %
HCT: 32.4 % — ABNORMAL LOW (ref 35.0–45.0)
HEMOGLOBIN: 10.5 g/dL — AB (ref 11.7–15.5)
Lymphs Abs: 1296 cells/uL (ref 850–3900)
MCH: 26.3 pg — ABNORMAL LOW (ref 27.0–33.0)
MCHC: 32.4 g/dL (ref 32.0–36.0)
MCV: 81 fL (ref 80.0–100.0)
MONOS PCT: 7.2 %
MPV: 10 fL (ref 7.5–12.5)
Neutro Abs: 5242 cells/uL (ref 1500–7800)
Neutrophils Relative %: 72.8 %
Platelets: 357 10*3/uL (ref 140–400)
RBC: 4 10*6/uL (ref 3.80–5.10)
RDW: 14.1 % (ref 11.0–15.0)
Total Lymphocyte: 18 %
WBC mixed population: 518 cells/uL (ref 200–950)
WBC: 7.2 10*3/uL (ref 3.8–10.8)

## 2018-03-23 LAB — HEPATIC FUNCTION PANEL
AG RATIO: 1 (calc) (ref 1.0–2.5)
ALKALINE PHOSPHATASE (APISO): 94 U/L (ref 33–115)
ALT: 9 U/L (ref 6–29)
AST: 15 U/L (ref 10–30)
Albumin: 3.6 g/dL (ref 3.6–5.1)
Bilirubin, Direct: 0.1 mg/dL (ref 0.0–0.2)
Globulin: 3.7 g/dL (calc) (ref 1.9–3.7)
Indirect Bilirubin: 0.1 mg/dL (calc) — ABNORMAL LOW (ref 0.2–1.2)
TOTAL PROTEIN: 7.3 g/dL (ref 6.1–8.1)
Total Bilirubin: 0.2 mg/dL (ref 0.2–1.2)

## 2018-03-23 LAB — VITAMIN D 25 HYDROXY (VIT D DEFICIENCY, FRACTURES): VIT D 25 HYDROXY: 15 ng/mL — AB (ref 30–100)

## 2018-03-23 LAB — BASIC METABOLIC PANEL
BUN: 13 mg/dL (ref 7–25)
CO2: 25 mmol/L (ref 20–32)
CREATININE: 0.72 mg/dL (ref 0.50–1.10)
Calcium: 9.1 mg/dL (ref 8.6–10.2)
Chloride: 105 mmol/L (ref 98–110)
GLUCOSE: 89 mg/dL (ref 65–99)
Potassium: 4 mmol/L (ref 3.5–5.3)
Sodium: 138 mmol/L (ref 135–146)

## 2018-03-23 LAB — LIPID PANEL
Cholesterol: 148 mg/dL (ref <200)
HDL: 46 mg/dL — ABNORMAL LOW (ref >50)
LDL Cholesterol (Calc): 83 mg/dL (calc)
NON-HDL CHOLESTEROL (CALC): 102 mg/dL (ref <130)
TRIGLYCERIDES: 92 mg/dL (ref <150)
Total CHOL/HDL Ratio: 3.2 (calc) (ref <5.0)

## 2018-03-23 LAB — TSH: TSH: 1.09 mIU/L

## 2018-03-25 ENCOUNTER — Other Ambulatory Visit: Payer: Self-pay

## 2018-03-25 MED ORDER — VITAMIN D (ERGOCALCIFEROL) 1.25 MG (50000 UNIT) PO CAPS: 50000.0000 [IU] | ORAL_CAPSULE | ORAL | 0 refills | Status: DC

## 2018-05-07 ENCOUNTER — Other Ambulatory Visit: Payer: Self-pay | Admitting: Family Medicine

## 2018-05-09 ENCOUNTER — Other Ambulatory Visit: Payer: Self-pay

## 2018-05-09 ENCOUNTER — Ambulatory Visit: Payer: BC Managed Care – PPO | Admitting: Physician Assistant

## 2018-05-09 ENCOUNTER — Encounter: Payer: Self-pay | Admitting: Physician Assistant

## 2018-05-09 VITALS — BP 122/86 | HR 80 | Temp 98.7°F | Resp 16 | Ht 64.0 in | Wt 293.0 lb

## 2018-05-09 DIAGNOSIS — R112 Nausea with vomiting, unspecified: Secondary | ICD-10-CM | POA: Diagnosis not present

## 2018-05-09 DIAGNOSIS — R1013 Epigastric pain: Secondary | ICD-10-CM

## 2018-05-09 LAB — CBC WITH DIFFERENTIAL/PLATELET
BASOS ABS: 0.1 10*3/uL (ref 0.0–0.1)
BASOS PCT: 1.1 % (ref 0.0–3.0)
EOS ABS: 0.1 10*3/uL (ref 0.0–0.7)
Eosinophils Relative: 0.9 % (ref 0.0–5.0)
HCT: 35.3 % — ABNORMAL LOW (ref 36.0–46.0)
Hemoglobin: 11.3 g/dL — ABNORMAL LOW (ref 12.0–15.0)
LYMPHS ABS: 1.1 10*3/uL (ref 0.7–4.0)
Lymphocytes Relative: 17 % (ref 12.0–46.0)
MCHC: 32.1 g/dL (ref 30.0–36.0)
MCV: 84 fl (ref 78.0–100.0)
MONOS PCT: 7.2 % (ref 3.0–12.0)
Monocytes Absolute: 0.5 10*3/uL (ref 0.1–1.0)
NEUTROS ABS: 4.8 10*3/uL (ref 1.4–7.7)
NEUTROS PCT: 73.8 % (ref 43.0–77.0)
PLATELETS: 369 10*3/uL (ref 150.0–400.0)
RBC: 4.21 Mil/uL (ref 3.87–5.11)
RDW: 15.4 % (ref 11.5–15.5)
WBC: 6.5 10*3/uL (ref 4.0–10.5)

## 2018-05-09 LAB — COMPREHENSIVE METABOLIC PANEL
ALT: 7 U/L (ref 0–35)
AST: 11 U/L (ref 0–37)
Albumin: 3.8 g/dL (ref 3.5–5.2)
Alkaline Phosphatase: 92 U/L (ref 39–117)
BILIRUBIN TOTAL: 0.3 mg/dL (ref 0.2–1.2)
BUN: 9 mg/dL (ref 6–23)
CALCIUM: 9.3 mg/dL (ref 8.4–10.5)
CHLORIDE: 103 meq/L (ref 96–112)
CO2: 26 meq/L (ref 19–32)
Creatinine, Ser: 0.74 mg/dL (ref 0.40–1.20)
GFR: 118.48 mL/min (ref >60.00)
GLUCOSE: 94 mg/dL (ref 70–99)
Potassium: 4.6 mEq/L (ref 3.5–5.1)
Sodium: 135 mEq/L (ref 135–145)
Total Protein: 7.9 g/dL (ref 6.0–8.3)

## 2018-05-09 LAB — H. PYLORI ANTIBODY, IGG: H PYLORI IGG: NEGATIVE

## 2018-05-09 LAB — LIPASE: Lipase: 8 U/L — ABNORMAL LOW (ref 11.0–59.0)

## 2018-05-09 MED ORDER — OMEPRAZOLE 40 MG PO CPDR: 40.0000 mg | DELAYED_RELEASE_CAPSULE | Freq: Every day | ORAL | 3 refills | Status: DC

## 2018-05-09 MED ORDER — ONDANSETRON HCL 4 MG PO TABS
4.0000 mg | ORAL_TABLET | Freq: Three times a day (TID) | ORAL | 0 refills | Status: DC | PRN
Start: 2018-05-09 — End: 2018-08-14

## 2018-05-09 NOTE — Progress Notes (Signed)
Patient presents to clinic today c/o 1 week of intermittent episodes of non-bloody emesis after eating larger meals. Endorses nausea occurring about 20 minutes after eating followed by mouth watering and emesis. Notes she feels somewhat better after vomiting but the nausea sometimes persists. Denies fever, chills. Denies change in bladder habits and bowel movements. Denies increased stress or anxiety. .   Past Medical History:  Diagnosis Date  . Chicken pox   . GERD (gastroesophageal reflux disease)   . PONV (postoperative nausea and vomiting)    vomiting after surgery gallbladder removal 2016  . Vitamin D deficiency     Current Outpatient Medications on File Prior to Visit  Medication Sig Dispense Refill  . albuterol (PROVENTIL HFA;VENTOLIN HFA) 108 (90 Base) MCG/ACT inhaler Inhale 2 puffs into the lungs every 6 (six) hours as needed for wheezing or shortness of breath. 1 Inhaler 2  . buPROPion (WELLBUTRIN SR) 100 MG 12 hr tablet Take 1 tablet (100 mg total) by mouth daily. 30 tablet 3  . FLUoxetine (PROZAC) 40 MG capsule TAKE 1 CAPSULE BY MOUTH EVERY DAY 90 capsule 1  . fluticasone (FLONASE) 50 MCG/ACT nasal spray Place 2 sprays into both nostrils daily. (Patient taking differently: Place 2 sprays into both nostrils as needed. ) 16 g 1  . traZODone (DESYREL) 50 MG tablet TAKE 1/2 TO 1 TABLETS (25-50 MG TOTAL) BY MOUTH AT BEDTIME AS NEEDED FOR SLEEP. 90 tablet 0  . Vitamin D, Ergocalciferol, (DRISDOL) 50000 units CAPS capsule Take 1 capsule (50,000 Units total) by mouth every 7 (seven) days. 12 capsule 0   No current facility-administered medications on file prior to visit.     Allergies  Allergen Reactions  . Penicillins Hives and Nausea And Vomiting    Has patient had a PCN reaction causing immediate rash, facial/tongue/throat swelling, SOB or lightheadedness with hypotension: no Has patient had a PCN reaction causing severe rash involving mucus membranes or skin necrosis:  unknown Has patient had a PCN reaction that required hospitalization unknown Has patient had a PCN reaction occurring within the last 10 years: unknown If all of the above answers are "NO", then may proceed with Cephalosporin use.     Family History  Problem Relation Age of Onset  . Diabetes Mother   . Hypertension Mother   . Stroke Maternal Grandmother   . Cancer Maternal Grandfather        lung  . Diabetes Maternal Grandfather   . Stroke Maternal Grandfather   . Hypertension Maternal Grandfather     Social History   Socioeconomic History  . Marital status: Single    Spouse name: Not on file  . Number of children: Not on file  . Years of education: Not on file  . Highest education level: Not on file  Occupational History  . Not on file  Social Needs  . Financial resource strain: Not on file  . Food insecurity:    Worry: Not on file    Inability: Not on file  . Transportation needs:    Medical: Not on file    Non-medical: Not on file  Tobacco Use  . Smoking status: Never Smoker  . Smokeless tobacco: Never Used  Substance and Sexual Activity  . Alcohol use: Yes    Comment: occasionally  . Drug use: No  . Sexual activity: Yes  Lifestyle  . Physical activity:    Days per week: Not on file    Minutes per session: Not on file  . Stress:  Not on file  Relationships  . Social connections:    Talks on phone: Not on file    Gets together: Not on file    Attends religious service: Not on file    Active member of club or organization: Not on file    Attends meetings of clubs or organizations: Not on file    Relationship status: Not on file  Other Topics Concern  . Not on file  Social History Narrative  . Not on file   Review of Systems - See HPI.  All other ROS are negative.  BP 122/86   Pulse 80   Temp 98.7 F (37.1 C) (Oral)   Resp 16   Ht 5\' 4"  (1.626 m)   Wt 293 lb (132.9 kg)   SpO2 99%   BMI 50.29 kg/m   Physical Exam  Constitutional: She appears  well-developed and well-nourished.  HENT:  Head: Normocephalic and atraumatic.  Eyes: Pupils are equal, round, and reactive to light.  Neck: Neck supple.  Cardiovascular: Normal rate, regular rhythm, normal heart sounds and intact distal pulses.  Pulmonary/Chest: Effort normal and breath sounds normal. No stridor. No respiratory distress. She has no wheezes. She has no rales. She exhibits no tenderness.  Abdominal: Soft. Bowel sounds are normal. She exhibits no distension and no mass. There is no hepatosplenomegaly. There is tenderness in the epigastric area. There is no rebound and no guarding. No hernia.  Psychiatric: She has a normal mood and affect.  Vitals reviewed.  Recent Results (from the past 2160 hour(s))  Lipid panel     Status: Abnormal   Collection Time: 03/22/18  4:26 PM  Result Value Ref Range   Cholesterol 148 <200 mg/dL   HDL 46 (L) >50 mg/dL   Triglycerides 92 <150 mg/dL   LDL Cholesterol (Calc) 83 mg/dL (calc)    Comment: Reference range: <100 . Desirable range <100 mg/dL for primary prevention;   <70 mg/dL for patients with CHD or diabetic patients  with > or = 2 CHD risk factors. Marland Kitchen LDL-C is now calculated using the Ewing Fandino-Hopkins  calculation, which is a validated novel method providing  better accuracy than the Friedewald equation in the  estimation of LDL-C.  Cresenciano Genre et al. Annamaria Helling. 3532;992(42): 2061-2068  (http://education.[facility].com/faq/FAQ164)    Total CHOL/HDL Ratio 3.2 <5.0 (calc)   Non-HDL Cholesterol (Calc) 102 <130 mg/dL (calc)    Comment: For patients with diabetes plus 1 major ASCVD risk  factor, treating to a non-HDL-C goal of <100 mg/dL  (LDL-C of <70 mg/dL) is considered a therapeutic  option.   Basic metabolic panel     Status: None   Collection Time: 03/22/18  4:26 PM  Result Value Ref Range   Glucose, Bld 89 65 - 99 mg/dL    Comment: .            Fasting reference interval .    BUN 13 7 - 25 mg/dL   Creat 0.72 0.50 -  1.10 mg/dL   BUN/Creatinine Ratio NOT APPLICABLE 6 - 22 (calc)   Sodium 138 135 - 146 mmol/L   Potassium 4.0 3.5 - 5.3 mmol/L   Chloride 105 98 - 110 mmol/L   CO2 25 20 - 32 mmol/L   Calcium 9.1 8.6 - 10.2 mg/dL  TSH     Status: None   Collection Time: 03/22/18  4:26 PM  Result Value Ref Range   TSH 1.09 mIU/L    Comment:  Reference Range .           > or = 20 Years  0.40-4.50 .                Pregnancy Ranges           First trimester    0.26-2.66           Second trimester   0.55-2.73           Third trimester    0.43-2.91   Hepatic function panel     Status: Abnormal   Collection Time: 03/22/18  4:26 PM  Result Value Ref Range   Total Protein 7.3 6.1 - 8.1 g/dL   Albumin 3.6 3.6 - 5.1 g/dL   Globulin 3.7 1.9 - 3.7 g/dL (calc)   AG Ratio 1.0 1.0 - 2.5 (calc)   Total Bilirubin 0.2 0.2 - 1.2 mg/dL   Bilirubin, Direct 0.1 0.0 - 0.2 mg/dL   Indirect Bilirubin 0.1 (L) 0.2 - 1.2 mg/dL (calc)   Alkaline phosphatase (APISO) 94 33 - 115 U/L   AST 15 10 - 30 U/L   ALT 9 6 - 29 U/L  CBC with Differential/Platelet     Status: Abnormal   Collection Time: 03/22/18  4:26 PM  Result Value Ref Range   WBC 7.2 3.8 - 10.8 Thousand/uL   RBC 4.00 3.80 - 5.10 Million/uL   Hemoglobin 10.5 (L) 11.7 - 15.5 g/dL   HCT 32.4 (L) 35.0 - 45.0 %   MCV 81.0 80.0 - 100.0 fL   MCH 26.3 (L) 27.0 - 33.0 pg   MCHC 32.4 32.0 - 36.0 g/dL   RDW 14.1 11.0 - 15.0 %   Platelets 357 140 - 400 Thousand/uL   MPV 10.0 7.5 - 12.5 fL   Neutro Abs 5,242 1,500 - 7,800 cells/uL   Lymphs Abs 1,296 850 - 3,900 cells/uL   WBC mixed population 518 200 - 950 cells/uL   Eosinophils Absolute 122 15 - 500 cells/uL   Basophils Absolute 22 0 - 200 cells/uL   Neutrophils Relative % 72.8 %   Total Lymphocyte 18.0 %   Monocytes Relative 7.2 %   Eosinophils Relative 1.7 %   Basophils Relative 0.3 %  VITAMIN D 25 Hydroxy (Vit-D Deficiency, Fractures)     Status: Abnormal   Collection Time: 03/22/18  4:26 PM    Result Value Ref Range   Vit D, 25-Hydroxy 15 (L) 30 - 100 ng/mL    Comment: Vitamin D Status         25-OH Vitamin D: . Deficiency:                    <20 ng/mL Insufficiency:             20 - 29 ng/mL Optimal:                 > or = 30 ng/mL . For 25-OH Vitamin D testing on patients on  D2-supplementation and patients for whom quantitation  of D2 and D3 fractions is required, the [facility](TM) 25-OH VIT D, (D2,D3), LC/MS/MS is recommended: order  code 301-713-7409 (patients >89yrs). . For more information on this test, go to: http://education.[facility].com/faq/FAQ163 (This link is being provided for  informational/educational purposes only.)    Assessment/Plan: 1. Non-intractable vomiting with nausea, unspecified vomiting type 2. Epigastric pain Gastritis and question gastroparesis. Will obtain CBC, CMP, Lipase, H. Pylori. Today. Start Zofran and Prilosec 40 mg. Small portion sizes and bland foods  recommended. If labs negative and symptoms are not improving quickly will need GI assessment.   - omeprazole (PRILOSEC) 40 MG capsule; Take 1 capsule (40 mg total) by mouth daily.  Dispense: 30 capsule; Refill: 3 - ondansetron (ZOFRAN) 4 MG tablet; Take 1 tablet (4 mg total) by mouth every 8 (eight) hours as needed for nausea or vomiting.  Dispense: 15 tablet; Refill: 0 - CBC with Differential/Platelet - Comprehensive metabolic panel - Lipase - H. pylori antibody, IgG     Leeanne Rio, PA-C

## 2018-05-09 NOTE — Patient Instructions (Signed)
Please go to the lab today for blood work.  I will call you with your results. We will alter treatment regimen(s) if indicated by your results.   Limit late night eating.  Avoid NSAIDs -- Ibuprofen, Aleve, Aspirin No alcohol. Keep well-hydrated. Eat a bland diet of softer foods.  Very small portions eaten over time for now.   Take the Omeprazole as directed. Make sure to take the Zofran as directed when needed.

## 2018-05-10 ENCOUNTER — Telehealth: Payer: Self-pay | Admitting: Family Medicine

## 2018-05-10 ENCOUNTER — Other Ambulatory Visit: Payer: Self-pay | Admitting: Physician Assistant

## 2018-05-10 DIAGNOSIS — R112 Nausea with vomiting, unspecified: Secondary | ICD-10-CM

## 2018-05-10 NOTE — Telephone Encounter (Signed)
Patient calling to obtain lab results. Nurse triage currently unavailable. Please advise.    Copied from [place] [id number]. Topic: Quick Communication - Lab Results >> May 10, 2018 12:42 PM Fagge, Judith Part, CMA wrote: Called patient to inform them of lab results. When patient returns call, triage nurse may disclose results.  Labs look good overall. Recommend GI assessment. Ok to place referral. How is she doing today? Is she taking medications as directed?   Please respond to questions asked by [facility]. Thank you!

## 2018-05-12 ENCOUNTER — Other Ambulatory Visit: Payer: Self-pay | Admitting: Family Medicine

## 2018-05-13 NOTE — Telephone Encounter (Signed)
Charted in result notes. 

## 2018-06-09 ENCOUNTER — Other Ambulatory Visit: Payer: Self-pay | Admitting: Family Medicine

## 2018-06-19 ENCOUNTER — Other Ambulatory Visit: Payer: Self-pay | Admitting: Family Medicine

## 2018-06-19 NOTE — Telephone Encounter (Signed)
Last OV 05/09/18, Next OV 09/20/18  Last filled 02/15/18, # 30 with 3 refills

## 2018-07-10 ENCOUNTER — Ambulatory Visit: Payer: BC Managed Care – PPO | Admitting: Family Medicine

## 2018-07-10 ENCOUNTER — Other Ambulatory Visit: Payer: Self-pay | Admitting: Family Medicine

## 2018-07-10 ENCOUNTER — Encounter: Payer: Self-pay | Admitting: Family Medicine

## 2018-07-10 ENCOUNTER — Other Ambulatory Visit: Payer: Self-pay

## 2018-07-10 VITALS — BP 125/83 | HR 81 | Temp 98.1°F | Resp 17 | Ht 64.0 in | Wt 286.5 lb

## 2018-07-10 DIAGNOSIS — B9689 Other specified bacterial agents as the cause of diseases classified elsewhere: Secondary | ICD-10-CM | POA: Diagnosis not present

## 2018-07-10 DIAGNOSIS — J019 Acute sinusitis, unspecified: Secondary | ICD-10-CM

## 2018-07-10 MED ORDER — DOXYCYCLINE HYCLATE 100 MG PO TABS: 100.0000 mg | ORAL_TABLET | Freq: Two times a day (BID) | ORAL | 0 refills | Status: DC

## 2018-07-10 MED ORDER — GUAIFENESIN-CODEINE 100-10 MG/5ML PO SYRP: 10.0000 mL | ORAL_SOLUTION | Freq: Three times a day (TID) | ORAL | 0 refills | Status: DC | PRN

## 2018-07-10 NOTE — Progress Notes (Signed)
   Subjective:    Patient ID: Krystal Harris, female    DOB: Jul 18, 1986, 32 y.o.   MRN: [id number]  HPI URI- sxs started ~5 days ago w/ nasal congestion.  Pt initially thought it was allergies.  Started Flonase and Benadryl.  sxs are now in her chest and she has a productive cough.  Yesterday was sweating at work.  L ear pain, HA.  + sick contacts- PNA.   Review of Systems For ROS see HPI     Objective:   Physical Exam  Constitutional: She appears well-developed and well-nourished. No distress.  obese  HENT:  Head: Normocephalic and atraumatic.  Right Ear: Tympanic membrane is retracted.  Left Ear: Tympanic membrane is retracted. A middle ear effusion is present.  Nose: Mucosal edema and rhinorrhea present. Right sinus exhibits no maxillary sinus tenderness and no frontal sinus tenderness. Left sinus exhibits maxillary sinus tenderness. Left sinus exhibits no frontal sinus tenderness.  Mouth/Throat: Uvula is midline and mucous membranes are normal. Posterior oropharyngeal erythema present. No oropharyngeal exudate.  Eyes: Pupils are equal, round, and reactive to light. Conjunctivae and EOM are normal.  Neck: Normal range of motion. Neck supple.  Cardiovascular: Normal rate, regular rhythm and normal heart sounds.  Pulmonary/Chest: Effort normal and breath sounds normal. No respiratory distress. She has no wheezes.  Lymphadenopathy:    She has no cervical adenopathy.  Vitals reviewed.         Assessment & Plan:  Bacterial sinusitis- new.  Start abx.  Cough meds prn.  Reviewed supportive care and red flags that should prompt return.  Pt expressed understanding and is in agreement w/ plan.

## 2018-07-10 NOTE — Patient Instructions (Signed)
Follow up as needed or as scheduled START the Doxycycline twice daily- take w/ food Drink plenty of fluids REST! Use Mucinex DM or Delsym for daytime cough Use the Codeine cough syrup for nights/weekends- may cause drowsiness Continue the daily Flonase and add Claritin or Zyrtec Call with any questions or concerns Hang in there!

## 2018-07-13 ENCOUNTER — Other Ambulatory Visit: Payer: Self-pay | Admitting: Family Medicine

## 2018-07-16 ENCOUNTER — Other Ambulatory Visit: Payer: Self-pay | Admitting: Family Medicine

## 2018-08-14 ENCOUNTER — Encounter: Payer: Self-pay | Admitting: Physician Assistant

## 2018-08-14 ENCOUNTER — Ambulatory Visit: Payer: BC Managed Care – PPO | Admitting: Physician Assistant

## 2018-08-14 ENCOUNTER — Other Ambulatory Visit: Payer: Self-pay

## 2018-08-14 VITALS — BP 118/84 | HR 95 | Temp 98.6°F | Resp 14 | Ht 64.0 in | Wt 286.0 lb

## 2018-08-14 DIAGNOSIS — H6982 Other specified disorders of Eustachian tube, left ear: Secondary | ICD-10-CM | POA: Diagnosis not present

## 2018-08-14 NOTE — Patient Instructions (Signed)
Restart the Flonase taking on a daily basis.  Start the Malone D taking once daily.  Continue saline nasal rinses. Pinch your nose and blow through it to help open eustachian tubes.  Follow-up if symptoms are not starting to improve in the next week.

## 2018-08-14 NOTE — Progress Notes (Signed)
Patient presents to clinic today c/o 2-3 weeks of L ear pressure, pain and popping. Denies fever, chills, sinus pain, chest congestion or cough. Denies recent travel or sick contact.    Past Medical History:  Diagnosis Date  . Chicken pox   . GERD (gastroesophageal reflux disease)   . PONV (postoperative nausea and vomiting)    vomiting after surgery gallbladder removal 2016  . Vitamin D deficiency     Current Outpatient Medications on File Prior to Visit  Medication Sig Dispense Refill  . albuterol (PROVENTIL HFA;VENTOLIN HFA) 108 (90 Base) MCG/ACT inhaler Inhale 2 puffs into the lungs every 6 (six) hours as needed for wheezing or shortness of breath. 1 Inhaler 2  . buPROPion (WELLBUTRIN SR) 100 MG 12 hr tablet TAKE 1 TABLET BY MOUTH EVERY DAY 27 tablet 1  . FLUoxetine (PROZAC) 40 MG capsule TAKE 1 CAPSULE BY MOUTH EVERY DAY 90 capsule 1  . fluticasone (FLONASE) 50 MCG/ACT nasal spray Place 2 sprays into both nostrils daily. (Patient taking differently: Place 2 sprays into both nostrils as needed. ) 16 g 1  . omeprazole (PRILOSEC) 40 MG capsule Take 1 capsule (40 mg total) by mouth daily. 30 capsule 3  . traZODone (DESYREL) 50 MG tablet TAKE 1/2 TO 1 TABLETS (25-50 MG TOTAL) BY MOUTH AT BEDTIME AS NEEDED FOR SLEEP. 30 tablet 2   No current facility-administered medications on file prior to visit.     Allergies  Allergen Reactions  . Penicillins Hives and Nausea And Vomiting    Has patient had a PCN reaction causing immediate rash, facial/tongue/throat swelling, SOB or lightheadedness with hypotension: no Has patient had a PCN reaction causing severe rash involving mucus membranes or skin necrosis: unknown Has patient had a PCN reaction that required hospitalization unknown Has patient had a PCN reaction occurring within the last 10 years: unknown If all of the above answers are "NO", then may proceed with Cephalosporin use.     Family History  Problem Relation Age of Onset  .  Diabetes Mother   . Hypertension Mother   . Stroke Maternal Grandmother   . Cancer Maternal Grandfather        lung  . Diabetes Maternal Grandfather   . Stroke Maternal Grandfather   . Hypertension Maternal Grandfather     Social History   Socioeconomic History  . Marital status: Single    Spouse name: Not on file  . Number of children: Not on file  . Years of education: Not on file  . Highest education level: Not on file  Occupational History  . Not on file  Social Needs  . Financial resource strain: Not on file  . Food insecurity:    Worry: Not on file    Inability: Not on file  . Transportation needs:    Medical: Not on file    Non-medical: Not on file  Tobacco Use  . Smoking status: Never Smoker  . Smokeless tobacco: Never Used  Substance and Sexual Activity  . Alcohol use: Yes    Comment: occasionally  . Drug use: No  . Sexual activity: Yes  Lifestyle  . Physical activity:    Days per week: Not on file    Minutes per session: Not on file  . Stress: Not on file  Relationships  . Social connections:    Talks on phone: Not on file    Gets together: Not on file    Attends religious service: Not on file  Active member of club or organization: Not on file    Attends meetings of clubs or organizations: Not on file    Relationship status: Not on file  Other Topics Concern  . Not on file  Social History Narrative  . Not on file   Review of Systems - See HPI.  All other ROS are negative.  BP 118/84   Pulse 95   Temp 98.6 F (37 C) (Oral)   Resp 14   Ht 5\' 4"  (1.626 m)   Wt 286 lb (129.7 kg)   SpO2 99%   BMI 49.09 kg/m   Physical Exam  Constitutional: She is oriented to person, place, and time. She appears well-developed and well-nourished.  HENT:  Head: Normocephalic and atraumatic.  Right Ear: A middle ear effusion (serous) is present.  Left Ear: Tympanic membrane normal.  Eyes: Conjunctivae and EOM are normal.  Neck: Neck supple.    Cardiovascular: Normal rate, regular rhythm and normal heart sounds.  Pulmonary/Chest: Effort normal.  Neurological: She is alert and oriented to person, place, and time.  Psychiatric: She has a normal mood and affect.  Vitals reviewed.  Assessment/Plan: 1. Eustachian tube dysfunction, left Restart Flonase daily. Start Allegra-D. Rx sent. Supportive measures reviewed. If not improving will consider addition of oral steroid versus ENT assessment.    Leeanne Rio, PA-C

## 2018-09-20 ENCOUNTER — Encounter: Payer: Self-pay | Admitting: Family Medicine

## 2018-09-20 ENCOUNTER — Other Ambulatory Visit: Payer: Self-pay

## 2018-09-20 ENCOUNTER — Ambulatory Visit: Payer: BC Managed Care – PPO | Admitting: Family Medicine

## 2018-09-20 DIAGNOSIS — F419 Anxiety disorder, unspecified: Secondary | ICD-10-CM | POA: Diagnosis not present

## 2018-09-20 DIAGNOSIS — Z23 Encounter for immunization: Secondary | ICD-10-CM | POA: Diagnosis not present

## 2018-09-20 MED ORDER — BUPROPION HCL ER (SR) 150 MG PO TB12: 150.0000 mg | ORAL_TABLET | Freq: Two times a day (BID) | ORAL | 3 refills | Status: DC

## 2018-09-20 NOTE — Progress Notes (Signed)
   Subjective:    Patient ID: Krystal Harris, female    DOB: 1985-12-20, 32 y.o.   MRN: [id number]  HPI Obesity- weight is unchanged since last visit.  'I know I need to lose weight'.  Was not able to afford copay for sleep apnea appt.  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    Anxiety- pt is 'drained by the end of the day'.  Working full time, works as a Loss adjuster, [employer], in school full time.  Trazodone is helping her sleep but doesn't let her stay asleep.     Review of Systems For ROS see HPI     Objective:   Physical Exam  Constitutional: She is oriented to person, place, and time. She appears well-developed and well-nourished. No distress.  obese  HENT:  Head: Normocephalic and atraumatic.  Eyes: Pupils are equal, round, and reactive to light. Conjunctivae and EOM are normal.  Neck: Normal range of motion. Neck supple. No thyromegaly present.  Cardiovascular: Normal rate, regular rhythm, normal heart sounds and intact distal pulses.  No murmur heard. Pulmonary/Chest: Effort normal and breath sounds normal. No respiratory distress.  Abdominal: Soft. She exhibits no distension. There is no tenderness.  Musculoskeletal: She exhibits no edema.  Lymphadenopathy:    She has no cervical adenopathy.  Neurological: She is alert and oriented to person, place, and time.  Skin: Skin is warm and dry.  Psychiatric: She has a normal mood and affect. Her behavior is normal.  Vitals reviewed.         Assessment & Plan:

## 2018-09-20 NOTE — Assessment & Plan Note (Signed)
Ongoing issue for pt.  She is very interested in losing weight but is restricted by time and finances.  Discussed possible options- Weight Watchers, Noom- pt feels that these are doable for her.  Particularly interested in [facility] b/c of the face to face meetings that will hold her accountable.  Encouraged her to exercise as able.  Will follow.

## 2018-09-20 NOTE — Assessment & Plan Note (Signed)
Worsening recently.  Increase Wellbutrin to 150mg  daily.  Will follow closely.

## 2018-09-20 NOTE — Patient Instructions (Signed)
Follow up in 1 month to recheck anxiety/mood START the Wellbutrin 150mg  once daily Please consider Weight Watchers or Noom to help lose weight Exercise as you are able- I know you are busy! Call with any questions or concerns Happy Holidays!

## 2018-10-21 ENCOUNTER — Ambulatory Visit: Payer: BC Managed Care – PPO | Admitting: Family Medicine

## 2018-10-21 ENCOUNTER — Encounter: Payer: Self-pay | Admitting: Family Medicine

## 2018-10-21 ENCOUNTER — Other Ambulatory Visit: Payer: Self-pay

## 2018-10-21 VITALS — BP 121/80 | HR 109 | Temp 98.1°F | Resp 17 | Ht 64.0 in | Wt 289.4 lb

## 2018-10-21 DIAGNOSIS — F419 Anxiety disorder, unspecified: Secondary | ICD-10-CM | POA: Diagnosis not present

## 2018-10-21 MED ORDER — BUPROPION HCL ER (XL) 150 MG PO TB24: 150.0000 mg | ORAL_TABLET | Freq: Every day | ORAL | 3 refills | Status: DC

## 2018-10-21 NOTE — Progress Notes (Signed)
   Subjective:    Patient ID: Krystal Harris, female    DOB: 1986-03-17, 33 y.o.   MRN: [id number]  HPI Depression/anxiety- we increased the Wellbutrin to 150mg  at least visit.  Pt did not notice improvement in mood but increased amount of headaches.  'it was a rough break.  I cried a lot'.  She wasn't able to go home for Christmas- 1st time ever.  Pt got up and went to gym this AM.   Review of Systems For ROS see HPI     Objective:   Physical Exam Vitals signs reviewed.  Constitutional:      General: She is not in acute distress.    Appearance: Normal appearance. She is obese.  Neurological:     General: No focal deficit present.     Mental Status: She is alert and oriented to person, place, and time.  Psychiatric:        Mood and Affect: Mood normal.        Behavior: Behavior normal.        Thought Content: Thought content normal.           Assessment & Plan:

## 2018-10-21 NOTE — Patient Instructions (Signed)
Follow up by phone/Mychart in 2-3 weeks and let me know how the anxiety is doing CONTINUE the Prozac once daily SWITCH to the extended release Wellbutrin- once daily I'm SO proud of you for going to the gym!  This is great for your physical and emotional health! Call with any questions or concerns Happy New Year!

## 2018-10-22 NOTE — Assessment & Plan Note (Signed)
Ongoing issue for pt.  She did not notice much improvement w/ the increased dose but she did notice increased HAs.  She found taking the 2nd tab difficult and the HAs tended to occur as the first tab was wearing off.  Will switch to XR Wellbutrin and continue the Prozac.  If needed, we can add Buspar in the future.  Will follow.

## 2018-10-28 ENCOUNTER — Ambulatory Visit: Payer: BC Managed Care – PPO | Admitting: Family Medicine

## 2018-10-28 ENCOUNTER — Encounter: Payer: Self-pay | Admitting: Family Medicine

## 2018-10-28 VITALS — BP 120/88 | HR 98 | Temp 98.7°F | Resp 18 | Ht 64.0 in | Wt 285.8 lb

## 2018-10-28 DIAGNOSIS — J028 Acute pharyngitis due to other specified organisms: Secondary | ICD-10-CM | POA: Diagnosis not present

## 2018-10-28 DIAGNOSIS — J029 Acute pharyngitis, unspecified: Secondary | ICD-10-CM

## 2018-10-28 DIAGNOSIS — B9789 Other viral agents as the cause of diseases classified elsewhere: Secondary | ICD-10-CM

## 2018-10-28 LAB — POCT RAPID STREP A (OFFICE): Rapid Strep A Screen: NEGATIVE

## 2018-10-28 NOTE — Progress Notes (Signed)
Subjective  CC:  Chief Complaint  Patient presents with  . URI    Symptoms started Wednesday.. Has tried OTC medication and cough drops    HPI: Krystal Harris is a 33 y.o. female who presents to the office today to address the problems listed above in the chief complaint.  C/o sore throat, mild URI sxs without fever. No SOB or GI sxs. + exposure to strep at work. OTC analgesics have been used with minimal or mild relief. Eating and drinking OK.   I reviewed the patients updated PMH, FH, and SocHx.    Patient Active Problem List   Diagnosis Date Noted  . Knee osteoarthritis 09/12/2017  . Morbid obesity ([place]) 08/13/2017  . Anxiety 04/11/2017  . Insomnia 04/11/2017  . Fatty liver 09/27/2016  . Routine general medical examination at a health care facility 02/21/2013   Current Meds  Medication Sig  . albuterol (PROVENTIL HFA;VENTOLIN HFA) 108 (90 Base) MCG/ACT inhaler Inhale 2 puffs into the lungs every 6 (six) hours as needed for wheezing or shortness of breath.  Marland Kitchen buPROPion (WELLBUTRIN XL) 150 MG 24 hr tablet Take 1 tablet (150 mg total) by mouth daily.  Marland Kitchen FLUoxetine (PROZAC) 40 MG capsule TAKE 1 CAPSULE BY MOUTH EVERY DAY  . fluticasone (FLONASE) 50 MCG/ACT nasal spray Place 2 sprays into both nostrils daily. (Patient taking differently: Place 2 sprays into both nostrils as needed. )  . omeprazole (PRILOSEC) 40 MG capsule Take 1 capsule (40 mg total) by mouth daily.  . traZODone (DESYREL) 50 MG tablet TAKE 1/2 TO 1 TABLETS (25-50 MG TOTAL) BY MOUTH AT BEDTIME AS NEEDED FOR SLEEP.    Allergies: Patient is allergic to penicillins.  Review of Systems: Constitutional: Negative for fever malaise or anorexia Cardiovascular: negative for chest pain Respiratory: negative for SOB or persistent cough Gastrointestinal: negative for abdominal pain  Objective  Vitals: BP 120/88   Pulse 98   Temp 98.7 F (37.1 C) (Oral)   Resp 18   Ht 5\' 4"  (1.626 m)   Wt 285 lb 12.8 oz (129.6  kg)   SpO2 97%   BMI 49.06 kg/m  General: no acute distress , A&Ox3 HEENT: PEERL, conjunctiva normal, bilateral EAC and TMs are normal. Nares normal. Oropharynx moist with erythematous posterior pharynx without exudate, Neg  cervical LAD, 2+ tonsils, midline uvula, neck is supple Cardiovascular:  RRR without murmur or gallop.  Respiratory:  Good breath sounds bilaterally, CTAB with normal respiratory effort Skin:  Warm, no rashes  Office Visit on 10/28/2018  Component Date Value Ref Range Status  . Rapid Strep A Screen 10/28/2018 Negative  Negative Final    Assessment  1. Sore throat   2. Acute viral pharyngitis      Plan   Supportive care with advil, tylenol, gargles etc discussed. Await culture due to exposure. RTO if sxs persist or worsen.   Follow up: Return if symptoms worsen or fail to improve.    Commons side effects, risks, benefits, and alternatives for medications and treatment plan prescribed today were discussed, and the patient expressed understanding of the given instructions. Patient is instructed to call or message via MyChart if he/she has any questions or concerns regarding our treatment plan. No barriers to understanding were identified. We discussed Red Flag symptoms and signs in detail. Patient expressed understanding regarding what to do in case of urgent or emergency type symptoms.   Medication list was reconciled, printed and provided to the patient in AVS. Patient instructions  and summary information was reviewed with the patient as documented in the AVS. This note was prepared with assistance of Dragon voice recognition software. Occasional wrong-word or sound-a-like substitutions may have occurred due to the inherent limitations of voice recognition software  Orders Placed This Encounter  Procedures  . Culture, Group A Strep  . POCT rapid strep A   No orders of the defined types were placed in this encounter.

## 2018-10-28 NOTE — Patient Instructions (Signed)
Please follow up if symptoms do not improve or as needed.    Pharyngitis  Pharyngitis is redness, pain, and swelling (inflammation) of the throat (pharynx). It is a very common cause of sore throat. Pharyngitis can be caused by a bacteria, but it is usually caused by a virus. Most cases of pharyngitis get better on their own without treatment. What are the causes? This condition may be caused by:  Infection by viruses (viral). Viral pharyngitis spreads from person to person (is contagious) through coughing, sneezing, and sharing of personal items or utensils such as cups, forks, spoons, and toothbrushes.  Infection by bacteria (bacterial). Bacterial pharyngitis may be spread by touching the nose or face after coming in contact with the bacteria, or through more intimate contact, such as kissing.  Allergies. Allergies can cause buildup of mucus in the throat (post-nasal drip), leading to inflammation and irritation. Allergies can also cause blocked nasal passages, forcing breathing through the mouth, which dries and irritates the throat. What increases the risk? You are more likely to develop this condition if:  You are 69-20 years old.  You are exposed to crowded environments such as daycare, school, or dormitory living.  You live in a cold climate.  You have a weakened disease-fighting (immune) system. What are the signs or symptoms? Symptoms of this condition vary by the cause (viral, bacterial, or allergies) and can include:  Sore throat.  Fatigue.  Low-grade fever.  Headache.  Joint pain and muscle aches.  Skin rashes.  Swollen glands in the throat (lymph nodes).  Plaque-like film on the throat or tonsils. This is often a symptom of bacterial pharyngitis.  Vomiting.  Stuffy nose (nasal congestion).  Cough.  Red, itchy eyes (conjunctivitis).  Loss of appetite. How is this diagnosed? This condition is often diagnosed based on your medical history and a physical  exam. Your health care provider will ask you questions about your illness and your symptoms. A swab of your throat may be done to check for bacteria (rapid strep test). Other lab tests may also be done, depending on the suspected cause, but these are rare. How is this treated? This condition usually gets better in 3-4 days without medicine. Bacterial pharyngitis may be treated with antibiotic medicines. Follow these instructions at home:  Take over-the-counter and prescription medicines only as told by your health care provider. ? If you were prescribed an antibiotic medicine, take it as told by your health care provider. Do not stop taking the antibiotic even if you start to feel better. ? Do not give children aspirin because of the association with Reye syndrome.  Drink enough water and fluids to keep your urine clear or pale yellow.  Get a lot of rest.  Gargle with a salt-water mixture 3-4 times a day or as needed. To make a salt-water mixture, completely dissolve -1 tsp of salt in 1 cup of warm water.  If your health care provider approves, you may use throat lozenges or sprays to soothe your throat. Contact a health care provider if:  You have large, tender lumps in your neck.  You have a rash.  You cough up green, yellow-brown, or bloody spit. Get help right away if:  Your neck becomes stiff.  You drool or are unable to swallow liquids.  You cannot drink or take medicines without vomiting.  You have severe pain that does not go away, even after you take medicine.  You have trouble breathing, and it is not caused by a  stuffy nose.  You have new pain and swelling in your joints such as the knees, ankles, wrists, or elbows. Summary  Pharyngitis is redness, pain, and swelling (inflammation) of the throat (pharynx).  While pharyngitis can be caused by a bacteria, the most common causes are viral.  Most cases of pharyngitis get better on their own without  treatment.  Bacterial pharyngitis is treated with antibiotic medicines. This information is not intended to replace advice given to you by your health care provider. Make sure you discuss any questions you have with your health care provider. Document Released: 10/02/2005 Document Revised: 11/07/2016 Document Reviewed: 11/07/2016 Elsevier Interactive Patient Education  2019 [employer].

## 2018-10-30 LAB — CULTURE, GROUP A STREP
MICRO NUMBER:: 48099
SPECIMEN QUALITY:: ADEQUATE

## 2018-12-12 ENCOUNTER — Other Ambulatory Visit: Payer: Self-pay | Admitting: Family Medicine

## 2019-01-09 ENCOUNTER — Telehealth: Payer: Self-pay | Admitting: Family Medicine

## 2019-01-09 NOTE — Telephone Encounter (Signed)
Copied from [place] [id number]. Topic: Quick Communication - See Telephone Encounter >> Jan 09, 2019  5:31 PM Loma Boston wrote: CRM for notification. See Telephone encounter for: 01/09/19. Pt called in with sore throat and burning type sensation. This was really the only issue, no fever, shortness of breath or contact. She would like to have a virtual appt so as not to come into the office. She will be at home and can contact in the morning at [phone] for a virtual visit.

## 2019-01-10 NOTE — Telephone Encounter (Signed)
LMOVM to return call to schedule appt  

## 2019-03-13 ENCOUNTER — Other Ambulatory Visit: Payer: Self-pay

## 2019-03-13 ENCOUNTER — Ambulatory Visit (INDEPENDENT_AMBULATORY_CARE_PROVIDER_SITE_OTHER): Payer: BC Managed Care – PPO | Admitting: Family Medicine

## 2019-03-13 ENCOUNTER — Telehealth: Payer: Self-pay

## 2019-03-13 ENCOUNTER — Encounter: Payer: Self-pay | Admitting: Family Medicine

## 2019-03-13 VITALS — Ht 64.0 in | Wt 282.0 lb

## 2019-03-13 DIAGNOSIS — M62838 Other muscle spasm: Secondary | ICD-10-CM | POA: Diagnosis not present

## 2019-03-13 MED ORDER — CYCLOBENZAPRINE HCL 10 MG PO TABS: 10.0000 mg | ORAL_TABLET | Freq: Three times a day (TID) | ORAL | 0 refills | Status: DC | PRN

## 2019-03-13 MED ORDER — MELOXICAM 15 MG PO TABS: 15.0000 mg | ORAL_TABLET | Freq: Every day | ORAL | 0 refills | Status: DC

## 2019-03-13 NOTE — Telephone Encounter (Signed)
Ok for VV

## 2019-03-13 NOTE — Progress Notes (Signed)
I have discussed the procedure for the virtual visit with the patient who has given consent to proceed with assessment and treatment.   Rafel Garde L Wallis Spizzirri, CMA     

## 2019-03-13 NOTE — Progress Notes (Signed)
Virtual Visit via Video   I connected with patient on 03/13/19 at  1:00 PM EDT by a video enabled telemedicine application and verified that I am speaking with the correct person using two identifiers.  Location patient: Home Location provider: [facility], Office Persons participating in the virtual visit: Patient, Provider, [place] (Jess B)  I discussed the limitations of evaluation and management by telemedicine and the availability of in person appointments. The patient expressed understanding and agreed to proceed.  Subjective:   HPI:   Back pain- sxs started ~2 weeks ago, worsening.  Pain is localized to upper back.  Pain is more midline and L sided.  Pt has pain when lifting L arm, 'like if I lift weights'.  No known injury.  Has been sitting and lying in bed more lately due to COVID restrictions.  Has not tried anything at home.  Some relief w/ heating pad.  Pt reports tightness in neck.  No pleuritic CP.  ROS:   See pertinent positives and negatives per HPI.  Patient Active Problem List   Diagnosis Date Noted  . Knee osteoarthritis 09/12/2017  . Morbid obesity ([place]) 08/13/2017  . Anxiety 04/11/2017  . Insomnia 04/11/2017  . Fatty liver 09/27/2016  . Routine general medical examination at a health care facility 02/21/2013    Social History   Tobacco Use  . Smoking status: Never Smoker  . Smokeless tobacco: Never Used  Substance Use Topics  . Alcohol use: Yes    Comment: occasionally    Current Outpatient Medications:  .  albuterol (PROVENTIL HFA;VENTOLIN HFA) 108 (90 Base) MCG/ACT inhaler, Inhale 2 puffs into the lungs every 6 (six) hours as needed for wheezing or shortness of breath., Disp: 1 Inhaler, Rfl: 2 .  buPROPion (WELLBUTRIN XL) 150 MG 24 hr tablet, Take 1 tablet (150 mg total) by mouth daily., Disp: 30 tablet, Rfl: 3 .  FLUoxetine (PROZAC) 40 MG capsule, TAKE 1 CAPSULE BY MOUTH EVERY DAY, Disp: 90 capsule, Rfl: 1 .  fluticasone (FLONASE) 50  MCG/ACT nasal spray, Place 2 sprays into both nostrils daily. (Patient taking differently: Place 2 sprays into both nostrils as needed. ), Disp: 16 g, Rfl: 1 .  omeprazole (PRILOSEC) 40 MG capsule, Take 1 capsule (40 mg total) by mouth daily., Disp: 30 capsule, Rfl: 3 .  traZODone (DESYREL) 50 MG tablet, TAKE 1/2 TO 1 TABLETS (25-50 MG TOTAL) BY MOUTH AT BEDTIME AS NEEDED FOR SLEEP., Disp: 30 tablet, Rfl: 2  Allergies  Allergen Reactions  . Penicillins Hives and Nausea And Vomiting    Has patient had a PCN reaction causing immediate rash, facial/tongue/throat swelling, SOB or lightheadedness with hypotension: no Has patient had a PCN reaction causing severe rash involving mucus membranes or skin necrosis: unknown Has patient had a PCN reaction that required hospitalization unknown Has patient had a PCN reaction occurring within the last 10 years: unknown If all of the above answers are "NO", then may proceed with Cephalosporin use.     Objective:   Ht 5\' 4"  (1.626 m)   Wt 282 lb (127.9 kg)   BMI 48.41 kg/m   AAOx3, NAD NCAT, EOMI No obvious CN deficits Coloring WNL Pt is able to speak clearly, coherently without shortness of breath or increased work of breathing.  Thought process is linear.  Mood is appropriate.   Assessment and Plan:   Trap spasm- based on pt's description of pain and neck tightness, I suspect trap spasm.  Start scheduled NSAIDs, add Flexeril  prn.  Continue heat.  Reviewed supportive care and red flags that should prompt return.  Pt expressed understanding and is in agreement w/ plan.    [name], MD 03/13/2019

## 2019-03-13 NOTE — Telephone Encounter (Signed)
Ok for video visit

## 2019-03-13 NOTE — Telephone Encounter (Signed)
Patient called in c/o back pain. Please advise in office visit or virtual visit.

## 2019-03-13 NOTE — Telephone Encounter (Signed)
LMOVM asking patient to return call to schedule VV

## 2019-03-20 ENCOUNTER — Other Ambulatory Visit: Payer: Self-pay | Admitting: Family Medicine

## 2019-04-01 ENCOUNTER — Encounter: Payer: Self-pay | Admitting: Family Medicine

## 2019-04-02 ENCOUNTER — Other Ambulatory Visit: Payer: Self-pay | Admitting: Physician Assistant

## 2019-04-02 DIAGNOSIS — R112 Nausea with vomiting, unspecified: Secondary | ICD-10-CM

## 2019-04-02 NOTE — Telephone Encounter (Signed)
Will defer further refills of patient's medications to PCP  

## 2019-04-03 ENCOUNTER — Other Ambulatory Visit: Payer: Self-pay

## 2019-04-03 ENCOUNTER — Ambulatory Visit (INDEPENDENT_AMBULATORY_CARE_PROVIDER_SITE_OTHER): Payer: BC Managed Care – PPO | Admitting: Family Medicine

## 2019-04-03 ENCOUNTER — Encounter: Payer: Self-pay | Admitting: Family Medicine

## 2019-04-03 VITALS — BP 123/82 | HR 99 | Temp 98.2°F | Resp 17 | Ht 64.0 in | Wt 270.1 lb

## 2019-04-03 DIAGNOSIS — M546 Pain in thoracic spine: Secondary | ICD-10-CM | POA: Diagnosis not present

## 2019-04-03 DIAGNOSIS — K219 Gastro-esophageal reflux disease without esophagitis: Secondary | ICD-10-CM

## 2019-04-03 MED ORDER — METHOCARBAMOL 500 MG PO TABS: 500.0000 mg | ORAL_TABLET | Freq: Three times a day (TID) | ORAL | 1 refills | Status: DC | PRN

## 2019-04-03 MED ORDER — PREDNISONE 10 MG PO TABS: ORAL_TABLET | ORAL | 0 refills | Status: DC

## 2019-04-03 NOTE — Progress Notes (Signed)
   Subjective:    Patient ID: Krystal Harris, female    DOB: 1986/03/07, 33 y.o.   MRN: [id number]  HPI Back pain- pain is bilateral from shoulders to bra line.  If arching back, will feel tightness in chest.  Muscle relaxer caused sedation but she is unable to relay if this improved pain.  Pain resolved last week but again returned this week.  No change in activity level.  When pt rotates to R feels pain in R chest, described as 'a squeeze'.  Pain in back is consistent.  Pain in chest will come and go.  No N/V.  No SOB.  No TTP.  GERD- increased due to recent Meloxicam.  + burning sensation.  Taking Omeprazole.  Obesity- pt has lost 12 lbs since last visit.   Review of Systems For ROS see HPI     Objective:   Physical Exam Vitals signs reviewed.  Constitutional:      General: She is not in acute distress.    Appearance: She is well-developed. She is obese. She is not ill-appearing.  HENT:     Head: Normocephalic and atraumatic.  Cardiovascular:     Heart sounds: Normal heart sounds. Heart sounds not distant. No murmur.  Pulmonary:     Effort: Pulmonary effort is normal.     Breath sounds: Normal breath sounds.  Chest:     Chest wall: Tenderness (TTP over R pectoral, thoracic paraspinal muscles) present.  Abdominal:     Tenderness: There is no abdominal tenderness. There is no guarding or rebound.  Neurological:     Mental Status: She is alert.           Assessment & Plan:  GERD- deteriorated.  Suspect that this is due to NSAID use.  Continue Omeprazole.  Add Tums PRN.  Discussed lifestyle and dietary modifications.  Will follow.  Back pain- ongoing.  Pt has multiple points that are TTP.  Stop NSAIDs due to GERD.  Switch Flexeril to Robaxin due to excessive sedation.  Start Prednisone taper.  Reviewed supportive care and red flags that should prompt return.  Pt expressed understanding and is in agreement w/ plan.  Obesity- ongoing issue for pt.  Applauded her efforts at  weight loss and encouraged her to continue.

## 2019-04-03 NOTE — Patient Instructions (Signed)
Follow up as needed or as scheduled STOP the Meloxicam and Flexeril START the Methocarbamol for spasm TAKE the Prednisone as directed- 3 tabs at the same time x3 days, then 2 tabs at the same time x3 days, and then 1 tab daily Do some gentle stretching Activity is GOOD! CONTINUE the Omeprazole ADD Tums as needed I am SO proud of you!!!  Keep up the good work on your weight loss efforts! Call with any questions or concerns Stay Safe!!!

## 2019-05-01 ENCOUNTER — Other Ambulatory Visit: Payer: Self-pay

## 2019-05-01 ENCOUNTER — Ambulatory Visit (INDEPENDENT_AMBULATORY_CARE_PROVIDER_SITE_OTHER): Payer: BC Managed Care – PPO | Admitting: Family Medicine

## 2019-05-01 ENCOUNTER — Encounter: Payer: Self-pay | Admitting: Family Medicine

## 2019-05-01 DIAGNOSIS — M546 Pain in thoracic spine: Secondary | ICD-10-CM | POA: Insufficient documentation

## 2019-05-01 DIAGNOSIS — K219 Gastro-esophageal reflux disease without esophagitis: Secondary | ICD-10-CM

## 2019-05-01 DIAGNOSIS — M542 Cervicalgia: Secondary | ICD-10-CM

## 2019-05-01 MED ORDER — PANTOPRAZOLE SODIUM 40 MG PO TBEC: 40.0000 mg | DELAYED_RELEASE_TABLET | Freq: Two times a day (BID) | ORAL | 3 refills | Status: DC

## 2019-05-01 MED ORDER — METHOCARBAMOL 500 MG PO TABS: 500.0000 mg | ORAL_TABLET | Freq: Three times a day (TID) | ORAL | 1 refills | Status: DC | PRN

## 2019-05-01 MED ORDER — SUCRALFATE 1 G PO TABS: 1.0000 g | ORAL_TABLET | Freq: Three times a day (TID) | ORAL | 0 refills | Status: DC

## 2019-05-01 NOTE — Progress Notes (Signed)
I have discussed the procedure for the virtual visit with the patient who has given consent to proceed with assessment and treatment.   Pt states she is having upper back pain, had a new symptom of tingling in the back of her head. Pt states that when the reflux flares up, her back pain flares up- feels like it all happens at once.   Feels better today because she took her omeprazole and muscle relaxer (takes 3 times a day)  Davis Gourd, CMA

## 2019-05-01 NOTE — Progress Notes (Signed)
Virtual Visit via Video   I connected with patient on 05/01/19 at  2:00 PM EDT by a video enabled telemedicine application and verified that I am speaking with the correct person using two identifiers.  Location patient: Home Location provider: Acupuncturist, Office Persons participating in the virtual visit: Patient, Provider, [place] (Jess B)  I discussed the limitations of evaluation and management by telemedicine and the availability of in person appointments. The patient expressed understanding and agreed to proceed.  Subjective:   HPI:   Back pain- pt finished the prednisone and stopped the muscle relaxers when she finished the prednisone.  Things were good for ~2 weeks and then the pain came back.  Pt was also suffering from increased reflux- so having pain in chest and back.  Pt has had 3 episodes of lying on her back and feeling some tingling in her neck/head.  Pt has pain in upper back and neck when standing up straight.  No relief w/ lying down.  Pain will radiate through back into chest.  No known injury or change in activity.  GERD- taking 2nd dose of PPI and adding Tums before bed helps.  ROS:   See pertinent positives and negatives per HPI.  Patient Active Problem List   Diagnosis Date Noted  . Knee osteoarthritis 09/12/2017  . Morbid obesity ([place]) 08/13/2017  . Anxiety 04/11/2017  . Insomnia 04/11/2017  . Fatty liver 09/27/2016  . Routine general medical examination at a health care facility 02/21/2013    Social History   Tobacco Use  . Smoking status: Never Smoker  . Smokeless tobacco: Never Used  Substance Use Topics  . Alcohol use: Yes    Comment: occasionally    Current Outpatient Medications:  .  albuterol (PROVENTIL HFA;VENTOLIN HFA) 108 (90 Base) MCG/ACT inhaler, Inhale 2 puffs into the lungs every 6 (six) hours as needed for wheezing or shortness of breath., Disp: 1 Inhaler, Rfl: 2 .  buPROPion (WELLBUTRIN XL) 150 MG 24 hr tablet, Take 1  tablet (150 mg total) by mouth daily., Disp: 30 tablet, Rfl: 3 .  FLUoxetine (PROZAC) 40 MG capsule, TAKE 1 CAPSULE BY MOUTH EVERY DAY, Disp: 90 capsule, Rfl: 1 .  fluticasone (FLONASE) 50 MCG/ACT nasal spray, Place 2 sprays into both nostrils daily. (Patient taking differently: Place 2 sprays into both nostrils as needed. ), Disp: 16 g, Rfl: 1 .  methocarbamol (ROBAXIN) 500 MG tablet, Take 1 tablet (500 mg total) by mouth every 8 (eight) hours as needed for muscle spasms., Disp: 30 tablet, Rfl: 1 .  omeprazole (PRILOSEC) 40 MG capsule, TAKE 1 CAPSULE BY MOUTH EVERY DAY, Disp: 90 capsule, Rfl: 0 .  traZODone (DESYREL) 50 MG tablet, TAKE 1/2 TO 1 TABLETS (25-50 MG TOTAL) BY MOUTH AT BEDTIME AS NEEDED FOR SLEEP., Disp: 30 tablet, Rfl: 2  Allergies  Allergen Reactions  . Penicillins Hives and Nausea And Vomiting    Has patient had a PCN reaction causing immediate rash, facial/tongue/throat swelling, SOB or lightheadedness with hypotension: no Has patient had a PCN reaction causing severe rash involving mucus membranes or skin necrosis: unknown Has patient had a PCN reaction that required hospitalization unknown Has patient had a PCN reaction occurring within the last 10 years: unknown If all of the above answers are "NO", then may proceed with Cephalosporin use.     Objective:   Temp 98.5 F (36.9 C) (Oral)   Ht 5\' 4"  (1.626 m)   Wt 268 lb (121.6 kg)  BMI 46.00 kg/m   AAOx3, NAD NCAT, EOMI No obvious CN deficits Coloring WNL Pt is able to speak clearly, coherently without shortness of breath or increased work of breathing.  Thought process is linear.  Mood is appropriate.   Assessment and Plan:   GERD- deteriorated.  sxs are causing severe chest pain, worse at night.  Improves w/ double dose of omeprazole and Tums.  Will switch to Protonix BID and add Carafate to allow healing.  If no improvement, will need GI referral.  Neck/back pain- had resolved s/p prednisone taper but  returned after 2 weeks.  sxs are moderate to severe.  Occur w/ both standing, lying.  Improves w/ Robaxin.  Having intermittent neck and back of head tingling which is likely a cervical issue.  Refer to sports med for evaluation and tx.  Pt expressed understanding and is in agreement w/ plan.    [name], MD 05/01/2019

## 2019-05-05 ENCOUNTER — Ambulatory Visit: Payer: BC Managed Care – PPO | Admitting: Family Medicine

## 2019-05-05 ENCOUNTER — Other Ambulatory Visit: Payer: Self-pay

## 2019-05-05 ENCOUNTER — Encounter: Payer: Self-pay | Admitting: Family Medicine

## 2019-05-05 VITALS — Ht 64.0 in | Wt 270.0 lb

## 2019-05-05 DIAGNOSIS — M546 Pain in thoracic spine: Secondary | ICD-10-CM | POA: Diagnosis not present

## 2019-05-05 MED ORDER — IBUPROFEN-FAMOTIDINE 800-26.6 MG PO TABS: 1.0000 | ORAL_TABLET | Freq: Three times a day (TID) | ORAL | 3 refills | Status: DC | PRN

## 2019-05-05 NOTE — Assessment & Plan Note (Signed)
Appears to be myofascial in nature.  No winging of the scapula.  Has been ongoing since she has been working from home. -Duexis and sample provided. -Counseled on home exercise therapy and supportive care. -Counseled on Thera cane. -If no improvement consider trigger point injections.

## 2019-05-05 NOTE — Patient Instructions (Signed)
Nice to meet you  Please take the duexis for 5 days straight and then as needed. You can take this with the muscle relaxer  Please try the exercises  Please try the thera cane.  Please try heat.  Please send me a message in MyChart with any questions or updates.  Please see me back in 4 weeks. We could consider a virtual visit if you're doing better.   --Dr. Raeford Razor

## 2019-05-05 NOTE — Progress Notes (Signed)
Krystal Harris - 33 y.o. female MRN [id number]  Date of birth: [birth year]  SUBJECTIVE:  Including CC & ROS.  Chief Complaint  Patient presents with  . Neck Pain  . Back Pain    upper back x 2 months    Krystal Harris is a 33 y.o. female that is presenting with upper back pain.  This is been ongoing for 2 months.  She is tried prednisone and different muscle relaxers.  She has improvement while on the medications but the pain returns.  The pain is intermittent in nature.  The pain is localized to the upper thoracic back.  She denies any radicular symptoms.  The pain is moderate in severity.  It is worse when she is working on projects in her bed.  Has started walking more.   Review of Systems  Constitutional: Negative for fever.  HENT: Negative for congestion.   Respiratory: Negative for cough.   Cardiovascular: Negative for chest pain.  Gastrointestinal: Negative for abdominal pain.  Musculoskeletal: Positive for back pain and neck pain.  Skin: Negative for color change.  Neurological: Negative for weakness.  Hematological: Negative for adenopathy.    HISTORY: Past Medical, Surgical, Social, and Family History Reviewed & Updated per EMR.   Pertinent Historical Findings include:  Past Medical History:  Diagnosis Date  . Chicken pox   . GERD (gastroesophageal reflux disease)   . PONV (postoperative nausea and vomiting)    vomiting after surgery gallbladder removal 2016  . Vitamin D deficiency     Past Surgical History:  Procedure Laterality Date  . CHOLECYSTECTOMY     laprascopic  . CYST EXCISION Right 12/29/2016   Procedure: EXCISION OF RIGHT AXILLARY CYST;  Surgeon: [name], MD;  Location: [facility];  Service: General;  Laterality: Right;  . FOREIGN BODY REMOVAL Right 09/17/2013   Procedure: REMOVAL FOREIGN BODY NEEDLE PLANTAR ASPECT OF THE FOOT;  Surgeon: Newt Minion, MD;  Location: [facility];  Service: Orthopedics;  Laterality: Right;  Remove Foreign Body Right Foot   . WISDOM TOOTH EXTRACTION      Allergies  Allergen Reactions  . Penicillins Hives and Nausea And Vomiting    Has patient had a PCN reaction causing immediate rash, facial/tongue/throat swelling, SOB or lightheadedness with hypotension: no Has patient had a PCN reaction causing severe rash involving mucus membranes or skin necrosis: unknown Has patient had a PCN reaction that required hospitalization unknown Has patient had a PCN reaction occurring within the last 10 years: unknown If all of the above answers are "NO", then may proceed with Cephalosporin use.     Family History  Problem Relation Age of Onset  . Diabetes Mother   . Hypertension Mother   . Stroke Maternal Grandmother   . Cancer Maternal Grandfather        lung  . Diabetes Maternal Grandfather   . Stroke Maternal Grandfather   . Hypertension Maternal Grandfather      Social History   Socioeconomic History  . Marital status: Single    Spouse name: Not on file  . Number of children: Not on file  . Years of education: Not on file  . Highest education level: Not on file  Occupational History  . Not on file  Social Needs  . Financial resource strain: Not on file  . Food insecurity    Worry: Not on file    Inability: Not on file  . Transportation needs    Medical: Not on file  Non-medical: Not on file  Tobacco Use  . Smoking status: Never Smoker  . Smokeless tobacco: Never Used  Substance and Sexual Activity  . Alcohol use: Yes    Comment: occasionally  . Drug use: No  . Sexual activity: Yes  Lifestyle  . Physical activity    Days per week: Not on file    Minutes per session: Not on file  . Stress: Not on file  Relationships  . Social Herbalist on phone: Not on file    Gets together: Not on file    Attends religious service: Not on file    Active member of club or organization: Not on file    Attends meetings of clubs or organizations: Not on file    Relationship status: Not on  file  . Intimate partner violence    Fear of current or ex partner: Not on file    Emotionally abused: Not on file    Physically abused: Not on file    Forced sexual activity: Not on file  Other Topics Concern  . Not on file  Social History Narrative  . Not on file     PHYSICAL EXAM:  VS: Ht 5\' 4"  (1.626 m)   Wt 270 lb (122.5 kg)   BMI 46.35 kg/m  Physical Exam Gen: NAD, alert, cooperative with exam, well-appearing ENT: normal lips, normal nasal mucosa,  Eye: normal EOM, normal conjunctiva and lids CV:  no edema, +2 pedal pulses   Resp: no accessory muscle use, non-labored,  Skin: no rashes, no areas of induration  Neuro: normal tone, normal sensation to touch Psych:  normal insight, alert and oriented MSK:  Back/neck: No tenderness palpation of the midline cervical spine or thoracic spine. Normal neck range of motion. Normal strength resistance with shrug. Normal shoulder range of motion. No winging of the scapula. Some tenderness palpation over the paraspinal muscles in the thoracic region. Neurovascular intact     ASSESSMENT & PLAN:   Thoracic back pain Appears to be myofascial in nature.  No winging of the scapula.  Has been ongoing since she has been working from home. -Duexis and sample provided. -Counseled on home exercise therapy and supportive care. -Counseled on Thera cane. -If no improvement consider trigger point injections.

## 2019-05-24 LAB — HM PAP SMEAR

## 2019-05-29 ENCOUNTER — Ambulatory Visit: Payer: BC Managed Care – PPO | Admitting: Family Medicine

## 2019-06-30 ENCOUNTER — Other Ambulatory Visit: Payer: Self-pay | Admitting: Family Medicine

## 2019-08-06 ENCOUNTER — Ambulatory Visit: Payer: BC Managed Care – PPO

## 2019-08-07 ENCOUNTER — Ambulatory Visit (INDEPENDENT_AMBULATORY_CARE_PROVIDER_SITE_OTHER): Payer: BC Managed Care – PPO | Admitting: Family Medicine

## 2019-08-07 ENCOUNTER — Other Ambulatory Visit: Payer: Self-pay

## 2019-08-07 ENCOUNTER — Encounter: Payer: Self-pay | Admitting: Family Medicine

## 2019-08-07 VITALS — Temp 97.9°F | Ht 64.0 in | Wt 272.0 lb

## 2019-08-07 DIAGNOSIS — K219 Gastro-esophageal reflux disease without esophagitis: Secondary | ICD-10-CM

## 2019-08-07 NOTE — Progress Notes (Signed)
I have discussed the procedure for the virtual visit with the patient who has given consent to proceed with assessment and treatment.   Jessica L Brodmerkel, CMA     

## 2019-08-07 NOTE — Progress Notes (Signed)
Virtual Visit via Video   I connected with patient on 08/07/19 at  2:30 PM EDT by a video enabled telemedicine application and verified that I am speaking with the correct person using two identifiers.  Location patient: Home Location provider: Acupuncturist, Office Persons participating in the virtual visit: Patient, Provider, [name] (Jess B)  I discussed the limitations of evaluation and management by telemedicine and the availability of in person appointments. The patient expressed understanding and agreed to proceed.  Subjective:   HPI:   GERD- chronic problem, on Protonix BID.  This summer had Carafate to help w/ sxs.  'i'm having heart burn a lot more than before'.  Will start coughing while eating, increased 'mucous'.  Burning sensation after eating.  sxs are 'way worse at night'.  Will wake up needing Tums.  Pt reports sxs were well controlled so stopped PPI and carafate.  Flared again 3 weeks ago.  No current NSAIDs.  Has seen Dr Collene Mares previously but would like to see someone new for a different opinion.  Pt reports she doesn't know what she's eating that triggers her sxs but then states she has GERD w/ 'chili dog, spaghetti, pizza, burgers, vinagrette'.    ROS:   See pertinent positives and negatives per HPI.  Patient Active Problem List   Diagnosis Date Noted  . GERD (gastroesophageal reflux disease) 05/01/2019  . Thoracic back pain 05/01/2019  . Knee osteoarthritis 09/12/2017  . Morbid obesity ([place]) 08/13/2017  . Anxiety 04/11/2017  . Insomnia 04/11/2017  . Fatty liver 09/27/2016  . Routine general medical examination at a health care facility 02/21/2013    Social History   Tobacco Use  . Smoking status: Never Smoker  . Smokeless tobacco: Never Used  Substance Use Topics  . Alcohol use: Yes    Comment: occasionally    Current Outpatient Medications:  .  albuterol (PROVENTIL HFA;VENTOLIN HFA) 108 (90 Base) MCG/ACT inhaler, Inhale 2 puffs into the lungs  every 6 (six) hours as needed for wheezing or shortness of breath., Disp: 1 Inhaler, Rfl: 2 .  buPROPion (WELLBUTRIN XL) 150 MG 24 hr tablet, Take 1 tablet (150 mg total) by mouth daily., Disp: 30 tablet, Rfl: 3 .  FLUoxetine (PROZAC) 40 MG capsule, TAKE 1 CAPSULE BY MOUTH EVERY DAY, Disp: 90 capsule, Rfl: 1 .  fluticasone (FLONASE) 50 MCG/ACT nasal spray, Place 2 sprays into both nostrils daily. (Patient taking differently: Place 2 sprays into both nostrils as needed. ), Disp: 16 g, Rfl: 1 .  Ibuprofen-Famotidine 800-26.6 MG TABS, Take 1 tablet by mouth 3 (three) times daily as needed., Disp: 90 tablet, Rfl: 3 .  methocarbamol (ROBAXIN) 500 MG tablet, Take 1 tablet (500 mg total) by mouth every 8 (eight) hours as needed for muscle spasms., Disp: 45 tablet, Rfl: 1 .  pantoprazole (PROTONIX) 40 MG tablet, Take 1 tablet (40 mg total) by mouth 2 (two) times daily., Disp: 60 tablet, Rfl: 3 .  sucralfate (CARAFATE) 1 g tablet, Take 1 tablet (1 g total) by mouth 4 (four) times daily -  with meals and at bedtime., Disp: 120 tablet, Rfl: 0 .  traZODone (DESYREL) 50 MG tablet, TAKE 1/2 TO 1 TABLETS (25-50 MG TOTAL) BY MOUTH AT BEDTIME AS NEEDED FOR SLEEP., Disp: 30 tablet, Rfl: 2  Allergies  Allergen Reactions  . Penicillins Hives and Nausea And Vomiting    Has patient had a PCN reaction causing immediate rash, facial/tongue/throat swelling, SOB or lightheadedness with hypotension: no Has patient had  a PCN reaction causing severe rash involving mucus membranes or skin necrosis: unknown Has patient had a PCN reaction that required hospitalization unknown Has patient had a PCN reaction occurring within the last 10 years: unknown If all of the above answers are "NO", then may proceed with Cephalosporin use.     Objective:   Temp 97.9 F (36.6 C) (Tympanic)   Ht 5\' 4"  (1.626 m)   Wt 272 lb (123.4 kg)   BMI 46.69 kg/m  AAOx3, NAD Obese NCAT, EOMI No obvious CN deficits Coloring WNL Pt is able to  speak clearly, coherently without shortness of breath or increased work of breathing.  Thought process is linear.  Mood is appropriate.   Assessment and Plan:   GERD- deteriorated.  Pt stopped her medication once her sxs improved.  Starting 3 weeks ago, sxs again worsened.  Reports that regardless of intake, she has sxs but mentioned all things that would cause severe sxs (chili dogs, pizza, spaghetti, hamburger).  Restart Protonix BID.  Restart Carafate.  Test for H pylori.  Will likely need GI referral.   [name], MD 08/07/2019

## 2019-08-08 ENCOUNTER — Other Ambulatory Visit: Payer: Self-pay

## 2019-08-08 ENCOUNTER — Ambulatory Visit (INDEPENDENT_AMBULATORY_CARE_PROVIDER_SITE_OTHER): Payer: BC Managed Care – PPO

## 2019-08-08 DIAGNOSIS — Z23 Encounter for immunization: Secondary | ICD-10-CM | POA: Diagnosis not present

## 2019-08-08 DIAGNOSIS — K219 Gastro-esophageal reflux disease without esophagitis: Secondary | ICD-10-CM

## 2019-08-08 NOTE — Addendum Note (Signed)
Addended by: Doran Clay A on: 08/08/2019 02:40 PM   Modules accepted: Orders

## 2019-08-12 ENCOUNTER — Ambulatory Visit: Payer: BC Managed Care – PPO

## 2019-08-12 LAB — H PYLORI, IGM, IGG, IGA AB
H pylori, IgM Abs: <9 units (ref 0.0–8.9)
H. pylori, IgA Abs: <9 units (ref 0.0–8.9)
H. pylori, IgG AbS: 0.2 Index Value (ref 0.00–0.79)

## 2019-08-19 ENCOUNTER — Other Ambulatory Visit: Payer: Self-pay

## 2019-08-19 ENCOUNTER — Ambulatory Visit (INDEPENDENT_AMBULATORY_CARE_PROVIDER_SITE_OTHER): Payer: BC Managed Care – PPO | Admitting: Family Medicine

## 2019-08-19 ENCOUNTER — Encounter (INDEPENDENT_AMBULATORY_CARE_PROVIDER_SITE_OTHER): Payer: Self-pay | Admitting: Family Medicine

## 2019-08-19 ENCOUNTER — Encounter: Payer: Self-pay | Admitting: Family Medicine

## 2019-08-19 VITALS — BP 127/76 | HR 88 | Temp 98.4°F | Ht 64.0 in | Wt 271.0 lb

## 2019-08-19 DIAGNOSIS — K760 Fatty (change of) liver, not elsewhere classified: Secondary | ICD-10-CM

## 2019-08-19 DIAGNOSIS — R739 Hyperglycemia, unspecified: Secondary | ICD-10-CM

## 2019-08-19 DIAGNOSIS — E559 Vitamin D deficiency, unspecified: Secondary | ICD-10-CM

## 2019-08-19 DIAGNOSIS — K219 Gastro-esophageal reflux disease without esophagitis: Secondary | ICD-10-CM

## 2019-08-19 DIAGNOSIS — F3289 Other specified depressive episodes: Secondary | ICD-10-CM

## 2019-08-19 DIAGNOSIS — Z0289 Encounter for other administrative examinations: Secondary | ICD-10-CM

## 2019-08-19 DIAGNOSIS — Z9189 Other specified personal risk factors, not elsewhere classified: Secondary | ICD-10-CM | POA: Diagnosis not present

## 2019-08-19 DIAGNOSIS — Z6841 Body Mass Index (BMI) 40.0 and over, adult: Secondary | ICD-10-CM

## 2019-08-19 DIAGNOSIS — R0602 Shortness of breath: Secondary | ICD-10-CM | POA: Diagnosis not present

## 2019-08-19 DIAGNOSIS — R5383 Other fatigue: Secondary | ICD-10-CM | POA: Diagnosis not present

## 2019-08-20 LAB — CBC WITH DIFFERENTIAL/PLATELET
Basophils Absolute: 0 10*3/uL (ref 0.0–0.2)
Basos: 1 %
EOS (ABSOLUTE): 0.1 10*3/uL (ref 0.0–0.4)
Eos: 1 %
Hematocrit: 33.5 % — ABNORMAL LOW (ref 34.0–46.6)
Hemoglobin: 10.9 g/dL — ABNORMAL LOW (ref 11.1–15.9)
Immature Grans (Abs): 0 10*3/uL (ref 0.0–0.1)
Immature Granulocytes: 0 %
Lymphocytes Absolute: 1.4 10*3/uL (ref 0.7–3.1)
Lymphs: 24 %
MCH: 27.3 pg (ref 26.6–33.0)
MCHC: 32.5 g/dL (ref 31.5–35.7)
MCV: 84 fL (ref 79–97)
Monocytes Absolute: 0.4 10*3/uL (ref 0.1–0.9)
Monocytes: 6 %
Neutrophils Absolute: 4 10*3/uL (ref 1.4–7.0)
Neutrophils: 68 %
Platelets: 371 10*3/uL (ref 150–450)
RBC: 3.99 x10E6/uL (ref 3.77–5.28)
RDW: 13.7 % (ref 11.7–15.4)
WBC: 5.9 10*3/uL (ref 3.4–10.8)

## 2019-08-20 LAB — COMPREHENSIVE METABOLIC PANEL
ALT: 13 IU/L (ref 0–32)
AST: 18 IU/L (ref 0–40)
Albumin/Globulin Ratio: 1.1 — ABNORMAL LOW (ref 1.2–2.2)
Albumin: 3.9 g/dL (ref 3.8–4.8)
Alkaline Phosphatase: 120 IU/L — ABNORMAL HIGH (ref 39–117)
BUN/Creatinine Ratio: 16 (ref 9–23)
BUN: 8 mg/dL (ref 6–20)
Bilirubin Total: 0.2 mg/dL (ref 0.0–1.2)
CO2: 23 mmol/L (ref 20–29)
Calcium: 9.6 mg/dL (ref 8.7–10.2)
Chloride: 104 mmol/L (ref 96–106)
Creatinine, Ser: 0.5 mg/dL — ABNORMAL LOW (ref 0.57–1.00)
GFR calc Af Amer: 149 mL/min/{1.73_m2} (ref >59)
GFR calc non Af Amer: 129 mL/min/{1.73_m2} (ref >59)
Globulin, Total: 3.6 g/dL (ref 1.5–4.5)
Glucose: 84 mg/dL (ref 65–99)
Potassium: 4.3 mmol/L (ref 3.5–5.2)
Sodium: 138 mmol/L (ref 134–144)
Total Protein: 7.5 g/dL (ref 6.0–8.5)

## 2019-08-20 LAB — LIPID PANEL WITH LDL/HDL RATIO
Cholesterol, Total: 156 mg/dL (ref 100–199)
HDL: 55 mg/dL (ref >39)
LDL Chol Calc (NIH): 89 mg/dL (ref 0–99)
LDL/HDL Ratio: 1.6 ratio (ref 0.0–3.2)
Triglycerides: 57 mg/dL (ref 0–149)
VLDL Cholesterol Cal: 12 mg/dL (ref 5–40)

## 2019-08-20 LAB — HEMOGLOBIN A1C
Est. average glucose Bld gHb Est-mCnc: 111 mg/dL
Hgb A1c MFr Bld: 5.5 % (ref 4.8–5.6)

## 2019-08-20 LAB — INSULIN, RANDOM: INSULIN: 16.7 u[IU]/mL (ref 2.6–24.9)

## 2019-08-20 LAB — VITAMIN D 25 HYDROXY (VIT D DEFICIENCY, FRACTURES): Vit D, 25-Hydroxy: 23 ng/mL — ABNORMAL LOW (ref 30.0–100.0)

## 2019-08-20 LAB — T4, FREE: Free T4: 1.33 ng/dL (ref 0.82–1.77)

## 2019-08-20 LAB — TSH: TSH: 0.853 u[IU]/mL (ref 0.450–4.500)

## 2019-08-20 LAB — T3: T3, Total: 141 ng/dL (ref 71–180)

## 2019-08-20 NOTE — Progress Notes (Signed)
Office: [phone]  /  Fax: [phone]   Dear Dr. Robert Bellow,   Thank you for referring Krystal Harris to our clinic. The following note includes my evaluation and treatment recommendations.  HPI:   Chief Complaint: OBESITY    Krystal Harris has been referred by Dr. Robert Bellow for consultation regarding her obesity and obesity related comorbidities.    Krystal Harris (MR# [id number]) is a 33 y.o. female who presents on 08/20/2019 for obesity evaluation and treatment. Current BMI is Body mass index is 46.52 kg/m.Marland Kitchen Krystal Harris has been struggling with her weight for many years and has been unsuccessful in either losing weight, maintaining weight loss, or reaching her healthy weight goal.     Krystal Harris attended our information session and states she is currently in the action stage of change and ready to dedicate time achieving and maintaining a healthier weight. Krystal Harris is interested in becoming our patient and working on intensive lifestyle modifications including (but not limited to) diet, exercise and weight loss.    Krystal Harris states her family sometimes eats meals together her desired weight loss is 96 lbs she has been heavy most of her life her heaviest weight ever was 294 lbs. she has significant food cravings issues  she skips breakfast sometimes she is sometimes drinking liquids with calories (3 times per week) she frequently makes poor food choices she has problems with excessive hunger  she frequently eats larger portions than normal  she has binge eating behaviors she struggles with emotional eating    Fatigue Krystal Harris feels her energy is lower than it should be. This has worsened with weight gain and has worsened recently. Krystal Harris denies daytime somnolence and admits to waking up still tired. Patient generally gets 9 hours of sleep per night, and states they generally have restful sleep. Snoring is not present. Apneic episodes are not present. Epworth  Sleepiness Score is 5.  Dyspnea on exertion Krystal Harris notes increasing shortness of breath with exercising and seems to be worsening over time with weight gain. She notes getting out of breath sooner with activity than she used to. This has gotten worse recently. Krystal Harris denies orthopnea.  Hyperglycemia Krystal Harris has a history of some elevated blood glucose readings and is at risk for diabetes mellitus.  At risk for diabetes Krystal Harris is at higher than average risk for developing diabetes due to her obesity. She currently denies polyuria or polydipsia.  Fatty Liver Krystal Harris saw her gastroenterologist, Dr. Collene Mares. Recent normal LFTs.  Depression with emotional eating behaviors Krystal Harris is struggling with emotional eating and using food for comfort to the extent that it is negatively impacting her health. She often snacks when she is not hungry. Krystal Harris sometimes feels she is out of control and then feels guilty that she made poor food choices. She has been working on behavior modification techniques to help reduce her emotional eating and has been somewhat successful. Krystal Harris's PHQ-9 score was 16. She states she has seen a therapist in the past and wants to see another through her PCP. She shows no sign of suicidal or homicidal ideations.  Depression Screen Krystal Harris's Food and Mood (modified PHQ-9) score was 16. Depression screen PHQ 2/9 08/19/2019  Decreased Interest 2  Down, Depressed, Hopeless 1  PHQ - 2 Score 3  Altered sleeping 1  Tired, decreased energy 3  Change in appetite 2  Feeling bad or failure about yourself  3  Trouble concentrating 1  Moving slowly or fidgety/restless 1  Suicidal thoughts 2  PHQ-9 Score 16  Difficult doing work/chores Somewhat difficult  Some recent data might be hidden   Gastroesophageal Reflux Disease (GERD) Krystal Harris has a diagnosis of gastroesophageal reflux disease and is taking a PPI. She is watching foods that exacerbate.  ASSESSMENT AND PLAN:  Other  fatigue - Plan: EKG 12-Lead, Comprehensive metabolic panel, CBC with Differential/Platelet, T3, T4, free, TSH  SOB (shortness of breath) on exertion - Plan: Comprehensive metabolic panel, CBC with Differential/Platelet, Lipid Panel With LDL/HDL Ratio  Hyperglycemia - Plan: Hemoglobin A1c, Insulin, random  Fatty liver - Plan: Comprehensive metabolic panel, CBC with Differential/Platelet  Gastroesophageal reflux disease, unspecified whether esophagitis present  Other depression - with emotional eating  At risk for diabetes mellitus  Class 3 severe obesity with serious comorbidity and body mass index (BMI) of 45.0 to 49.9 in adult, unspecified obesity type (HCC)  Vitamin D deficiency - Plan: VITAMIN D 25 Hydroxy (Vit-D Deficiency, Fractures)  PLAN:  Fatigue Krystal Harris was informed that her fatigue may be related to obesity, depression or many other causes. Labs will be ordered, and in the meanwhile Krystal Harris has agreed to work on diet, exercise and weight loss to help with fatigue. Proper sleep hygiene was discussed including the need for 7-8 hours of quality sleep each night. A sleep study was not ordered based on symptoms and Epworth score.  Dyspnea on exertion Krystal Harris shortness of breath appears to be obesity related and exercise induced. She has agreed to work on weight loss and gradually increase exercise to treat her exercise induced shortness of breath. If Krystal Harris follows our instructions and loses weight without improvement of her shortness of breath, we will plan to refer to pulmonology. We will monitor this condition regularly. Krystal Harris agrees to this plan.  Hyperglycemia Fasting labs will be obtained and results with be discussed with Krystal Harris in 2 weeks at her follow up visit. In the meanwhile Krystal Harris will decrease her intake of sugar drinks and will work on weight loss efforts.  Diabetes risk counseling Krystal Harris was given extended (15 minutes) diabetes prevention counseling today.  She is 33 y.o. female and has risk factors for diabetes including obesity. We discussed intensive lifestyle modifications today with an emphasis on weight loss as well as increasing exercise and decreasing simple carbohydrates in her diet.  Fatty Liver We will monitor her LFTs and Krystal Harris will follow-up with our clinic in 2 weeks.  Depression with Emotional Eating Behaviors We discussed behavior modification techniques today to help Krystal Harris deal with her emotional eating and depression. Krystal Harris will follow-up with her PCP for referral to a therapist.  Depression Screen Krystal Harris had a strongly positive depression screening. Depression is commonly associated with obesity and often results in emotional eating behaviors. We will monitor this closely and work on CBT to help improve the non-hunger eating patterns. Referral to Psychology may be required if no improvement is seen as she continues in our clinic.  Gastroesophageal Reflux Disease (GERD) Krystal Harris has been referred to Gastroenterology by her PCP.  Obesity Krystal Harris is currently in the action stage of change and her goal is to continue with weight loss efforts. I recommend Jillane begin the structured treatment plan as follows:  She has agreed to follow the Category 1 plan.  Krystal Harris has been instructed to eventually work up to a goal of 150 minutes of combined cardio and strengthening exercise per week for weight loss and overall health benefits. We discussed the following Behavioral Modification Strategies today: increasing lean protein intake, decreasing simple carbohydrates, increasing vegetables,  increase H20 intake, decrease eating out, no skipping meals, work on meal planning and easy cooking plans.   She was informed of the importance of frequent follow-up visits to maximize her success with intensive lifestyle modifications for her multiple health conditions. She was informed we would discuss her lab results at her next visit unless  there is a critical issue that needs to be addressed sooner. Krystal Harris agreed to keep her next visit at the agreed upon time to discuss these results.  ALLERGIES: Allergies  Allergen Reactions  . Penicillins Hives and Nausea And Vomiting    Has patient had a PCN reaction causing immediate rash, facial/tongue/throat swelling, SOB or lightheadedness with hypotension: no Has patient had a PCN reaction causing severe rash involving mucus membranes or skin necrosis: unknown Has patient had a PCN reaction that required hospitalization unknown Has patient had a PCN reaction occurring within the last 10 years: unknown If all of the above answers are "NO", then may proceed with Cephalosporin use.     MEDICATIONS: Current Outpatient Medications on File Prior to Visit  Medication Sig Dispense Refill  . acetaminophen (TYLENOL) 500 MG tablet Take by mouth every 6 (six) hours as needed.    Marland Kitchen buPROPion (WELLBUTRIN XL) 150 MG 24 hr tablet Take 1 tablet (150 mg total) by mouth daily. 30 tablet 3  . FLUoxetine (PROZAC) 40 MG capsule TAKE 1 CAPSULE BY MOUTH EVERY DAY 90 capsule 1  . pantoprazole (PROTONIX) 40 MG tablet Take 1 tablet (40 mg total) by mouth 2 (two) times daily. 60 tablet 3  . sucralfate (CARAFATE) 1 g tablet Take 1 tablet (1 g total) by mouth 4 (four) times daily -  with meals and at bedtime. 120 tablet 0  . traZODone (DESYREL) 50 MG tablet TAKE 1/2 TO 1 TABLETS (25-50 MG TOTAL) BY MOUTH AT BEDTIME AS NEEDED FOR SLEEP. 30 tablet 2   No current facility-administered medications on file prior to visit.     PAST MEDICAL HISTORY: Past Medical History:  Diagnosis Date  . Anxiety   . Chicken pox   . Depression   . Gallbladder problem   . GERD (gastroesophageal reflux disease)   . PONV (postoperative nausea and vomiting)    vomiting after surgery gallbladder removal 2016  . Vitamin D deficiency     PAST SURGICAL HISTORY: Past Surgical History:  Procedure Laterality Date  .  CHOLECYSTECTOMY     laprascopic  . CYST EXCISION Right 12/29/2016   Procedure: EXCISION OF RIGHT AXILLARY CYST;  Surgeon: [name], MD;  Location: [facility];  Service: General;  Laterality: Right;  . FOOT SURGERY  02/2015   scar tissue and keloid scar removal  . FOREIGN BODY REMOVAL Right 09/17/2013   Procedure: REMOVAL FOREIGN BODY NEEDLE PLANTAR ASPECT OF THE FOOT;  Surgeon: Newt Minion, MD;  Location: [facility];  Service: Orthopedics;  Laterality: Right;  Remove Foreign Body Right Foot  . WISDOM TOOTH EXTRACTION      SOCIAL HISTORY: Social History   Tobacco Use  . Smoking status: Never Smoker  . Smokeless tobacco: Never Used  Substance Use Topics  . Alcohol use: Yes    Comment: occasionally  . Drug use: No    FAMILY HISTORY: Family History  Problem Relation Age of Onset  . Diabetes Mother   . Hypertension Mother   . Thyroid disease Mother   . Obesity Mother   . Stroke Maternal Grandmother   . Cancer Maternal Grandfather  lung  . Diabetes Maternal Grandfather   . Stroke Maternal Grandfather   . Hypertension Maternal Grandfather    ROS: Review of Systems  Cardiovascular: Negative for orthopnea.  Gastrointestinal:       Positive for GERD.  Psychiatric/Behavioral: Positive for depression (emotional eating). Negative for suicidal ideas.       Negative for homicidal ideas.   PHYSICAL EXAM: Blood pressure 127/76, pulse 88, temperature 98.4 F (36.9 C), temperature source Oral, height 5\' 4"  (1.626 m), weight 271 lb (122.9 kg), last menstrual period 08/10/2019, SpO2 100 %. Body mass index is 46.52 kg/m. Physical Exam Vitals signs reviewed.  Constitutional:      Appearance: Normal appearance. She is well-developed. She is obese.  HENT:     Head: Normocephalic and atraumatic.     Nose: Nose normal.  Eyes:     General: No scleral icterus. Neck:     Musculoskeletal: Normal range of motion.  Cardiovascular:     Rate and Rhythm: Normal rate and regular rhythm.    Pulmonary:     Effort: Pulmonary effort is normal. No respiratory distress.  Abdominal:     Palpations: Abdomen is soft.     Tenderness: There is no abdominal tenderness.  Musculoskeletal: Normal range of motion.     Comments: Range of motion normal in all four extremities.  Skin:    General: Skin is warm and dry.  Neurological:     Mental Status: She is alert and oriented to person, place, and time.     Coordination: Coordination normal.  Psychiatric:        Mood and Affect: Mood and affect normal.        Behavior: Behavior normal.   RECENT LABS AND TESTS: BMET    Component Value Date/Time   NA 138 08/19/2019 1145   K 4.3 08/19/2019 1145   CL 104 08/19/2019 1145   CO2 23 08/19/2019 1145   GLUCOSE 84 08/19/2019 1145   GLUCOSE 94 05/09/2018 0933   BUN 8 08/19/2019 1145   CREATININE 0.50 (L) 08/19/2019 1145   CREATININE 0.72 03/22/2018 1626   CALCIUM 9.6 08/19/2019 1145   GFRNONAA 129 08/19/2019 1145   GFRAA 149 08/19/2019 1145   Lab Results  Component Value Date   HGBA1C 5.5 08/19/2019   Lab Results  Component Value Date   INSULIN 16.7 08/19/2019   CBC    Component Value Date/Time   WBC 5.9 08/19/2019 1145   WBC 6.5 05/09/2018 0933   RBC 3.99 08/19/2019 1145   RBC 4.21 05/09/2018 0933   HGB 10.9 (L) 08/19/2019 1145   HCT 33.5 (L) 08/19/2019 1145   PLT 371 08/19/2019 1145   MCV 84 08/19/2019 1145   MCH 27.3 08/19/2019 1145   MCH 26.3 (L) 03/22/2018 1626   MCHC 32.5 08/19/2019 1145   MCHC 32.1 05/09/2018 0933   RDW 13.7 08/19/2019 1145   LYMPHSABS 1.4 08/19/2019 1145   MONOABS 0.5 05/09/2018 0933   EOSABS 0.1 08/19/2019 1145   BASOSABS 0.0 08/19/2019 1145   Iron/TIBC/Ferritin/ %Sat No results found for: IRON, TIBC, FERRITIN, IRONPCTSAT Lipid Panel     Component Value Date/Time   CHOL 156 08/19/2019 1145   TRIG 57 08/19/2019 1145   HDL 55 08/19/2019 1145   CHOLHDL 3.2 03/22/2018 1626   VLDL 9.4 02/21/2013 1050   LDLCALC 89 08/19/2019 1145    LDLCALC 83 03/22/2018 1626   Hepatic Function Panel     Component Value Date/Time   PROT 7.5 08/19/2019 1145  ALBUMIN 3.9 08/19/2019 1145   AST 18 08/19/2019 1145   ALT 13 08/19/2019 1145   ALKPHOS 120 (H) 08/19/2019 1145   BILITOT 0.2 08/19/2019 1145   BILIDIR 0.1 03/22/2018 1626   IBILI 0.1 (L) 03/22/2018 1626      Component Value Date/Time   TSH 0.853 08/19/2019 1145   TSH 1.09 03/22/2018 1626   TSH 1.15 08/13/2017 1549   No results found for: Vitamin D, 25-Hydroxy  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 PRi = 119. Nonspecific Krystal Harris-abnormality. Nondiagnostic for age. Abnormal.  INDIRECT CALORIMETER done today shows a VO2 of 202 and a REE of 1409.  Her calculated basal metabolic rate is 0000000 thus her basal metabolic rate is worse than expected.  OBESITY BEHAVIORAL INTERVENTION VISIT  Today's visit was #1   Starting weight: 271 lbs Starting date: 08/19/2019 Today's weight: 271 lbs  Today's date: 08/19/2019 Total lbs lost to date: 0    08/19/2019  Height 5\' 4"  (1.626 m)  Weight 271 lb (122.9 kg)  BMI (Calculated) 46.49  BLOOD PRESSURE - SYSTOLIC [id number]  BLOOD PRESSURE - DIASTOLIC 76  Waist Measurement  47 inches   Body Fat % 49.6 %  Total Body Water (lbs) 93.4 lbs  RMR 1409   ASK: We discussed the diagnosis of obesity with Dolores [name] today and Zarai agreed to give [place] permission to discuss obesity behavioral modification therapy today.  ASSESS: Ahley has the diagnosis of obesity and her BMI today is 46.7. Cleon is in the action stage of change.   ADVISE: Tonika was educated on the multiple health risks of obesity as well as the benefit of weight loss to improve her health. She was advised of the need for long term treatment and the importance of lifestyle modifications to improve her current health and to decrease her risk of future health problems.  AGREE: Multiple dietary modification options and treatment options were discussed and  Ymani agreed  to follow the recommendations documented in the above note.  ARRANGE: Itzell was educated on the importance of frequent visits to treat obesity as outlined per CMS and USPSTF guidelines and agreed to schedule her next follow up appointment today.  IMichaelene Song, am acting as Location manager for [employer]. Juleen China, DO  I have reviewed the above documentation for accuracy and completeness, and I agree with the above. Briscoe Deutscher, DO

## 2019-08-23 ENCOUNTER — Other Ambulatory Visit: Payer: Self-pay | Admitting: General Practice

## 2019-08-23 MED ORDER — SUCRALFATE 1 G PO TABS: 1.0000 g | ORAL_TABLET | Freq: Three times a day (TID) | ORAL | 0 refills | Status: DC

## 2019-09-02 ENCOUNTER — Ambulatory Visit (INDEPENDENT_AMBULATORY_CARE_PROVIDER_SITE_OTHER): Payer: BC Managed Care – PPO | Admitting: Family Medicine

## 2019-09-02 ENCOUNTER — Other Ambulatory Visit: Payer: Self-pay

## 2019-09-02 VITALS — BP 151/84 | HR 108 | Temp 98.8°F | Ht 64.0 in | Wt 267.0 lb

## 2019-09-02 DIAGNOSIS — Z9189 Other specified personal risk factors, not elsewhere classified: Secondary | ICD-10-CM | POA: Diagnosis not present

## 2019-09-02 DIAGNOSIS — D649 Anemia, unspecified: Secondary | ICD-10-CM

## 2019-09-02 DIAGNOSIS — E8881 Metabolic syndrome: Secondary | ICD-10-CM | POA: Diagnosis not present

## 2019-09-02 DIAGNOSIS — E559 Vitamin D deficiency, unspecified: Secondary | ICD-10-CM

## 2019-09-02 DIAGNOSIS — Z6841 Body Mass Index (BMI) 40.0 and over, adult: Secondary | ICD-10-CM

## 2019-09-02 DIAGNOSIS — R1013 Epigastric pain: Secondary | ICD-10-CM

## 2019-09-02 MED ORDER — FERROUS SULFATE 325 (65 FE) MG PO TABS: 325.0000 mg | ORAL_TABLET | Freq: Every day | ORAL | 3 refills | Status: DC

## 2019-09-02 MED ORDER — DEXLANSOPRAZOLE 60 MG PO CPDR: 60.0000 mg | DELAYED_RELEASE_CAPSULE | Freq: Every day | ORAL | 0 refills | Status: DC

## 2019-09-02 MED ORDER — VITAMIN D (ERGOCALCIFEROL) 1.25 MG (50000 UNIT) PO CAPS: 50000.0000 [IU] | ORAL_CAPSULE | ORAL | 0 refills | Status: DC

## 2019-09-03 ENCOUNTER — Encounter: Payer: Self-pay | Admitting: Physician Assistant

## 2019-09-03 NOTE — Progress Notes (Signed)
Office: [phone]  /  Fax: [phone]   HPI:   Chief Complaint: OBESITY Linsi is here to discuss her progress with her obesity treatment plan. She is on the Category 1 plan and is following her eating plan approximately 85-90 % of the time. She states she is exercising 0 minutes 0 times per week. Che is down 4 lbs, but hungry in the morning. She is struggling with dyspepsia and epigastric pain that radiates to her mid back. She states she has stopped sodas and increased water. Her weight is 267 lb (121.1 kg) today and has had a weight loss of 4 pounds over a period of 2 weeks since her last visit. She has lost 4 lbs since starting treatment with [place].  Vitamin D Deficiency Tamra has a new diagnosis of vitamin D deficiency. She is not currently taking Vit D. Last Vit D level was 23 on 08/19/2019. She denies nausea, vomiting or muscle weakness.  Anemia Minaal has a diagnosis of anemia. Last Hg was 10.9 and MCV of 84. She is not on iron supplementation.   Insulin Resistance Jenaiya has a diagnosis of insulin resistance based on her elevated fasting insulin level >5. Last insulin level was 16.7. Although Nyjah's blood glucose readings are still under good control, insulin resistance puts her at greater risk of metabolic syndrome and diabetes. She is not taking metformin currently and continues to work on diet and exercise to decrease risk of diabetes.  At risk for diabetes Aariel is at higher than average risk for developing diabetes due to her obesity and insulin resistance. She currently denies polyuria or polydipsia.  Dyspepsia Yamina has a diagnosis of dyspepsia. Unfortunately, Protonix and Carafate are not controlling her symptoms. She did see a GI doctor in the past and an EGD was recommended. The patient would prefer to be referred to [facility] for evaluation and EGD.   ASSESSMENT AND PLAN:  Vitamin D deficiency - Plan: Vitamin D, Ergocalciferol, (DRISDOL) 1.25 MG  (50000 UT) CAPS capsule  Anemia, unspecified type - Plan: ferrous sulfate 325 (65 FE) MG tablet  Insulin resistance  Dyspepsia - Plan: dexlansoprazole (DEXILANT) 60 MG capsule, Ambulatory referral to Gastroenterology  At risk for diabetes mellitus  Class 3 severe obesity with serious comorbidity and body mass index (BMI) of 45.0 to 49.9 in adult, unspecified obesity type ([place])  PLAN:  Vitamin D Deficiency Jearldean was informed that low vitamin D levels contributes to fatigue and are associated with obesity, breast, and colon cancer. Ziyonna agrees to start prescription Vit D 50,000 IU every week #4 with no refills. She will follow up for routine testing of vitamin D, at least 2-3 times per year. She was informed of the risk of over-replacement of vitamin D and agrees to not increase her dose unless she discusses this with [place] first. Audreanna agrees to follow up with our clinic in 2 weeks.  Anemia This likely Iron deficiency anemia, but not confirmed. The diagnosis was discussed with Samirah and was explained in detail. She was given suggestions of iron rich foods. Anye agrees to start ferrous sulfate 325 mg PO daily #30 and we will refill for 3 months. We will recheck anemia panel at her future visit. Jonni agrees to follow up with our clinic in 2 weeks.   Insulin Resistance Capricia will continue to work on weight loss, exercise, and decreasing simple carbohydrates in her diet to help decrease the risk of diabetes. We dicussed metformin including benefits and risks. She was informed  that eating too many simple carbohydrates or too many calories at one sitting increases the likelihood of GI side effects. We discussed future possibility of metformin, but will hold until dyspepsia has improved. Amaria agrees to follow up with [place] as directed to monitor her progress.  Diabetes risk counseling Stefenie was given extended (15 minutes) diabetes prevention counseling today. She is 33 y.o. female and  has risk factors for diabetes including obesity and insulin resistance. We discussed intensive lifestyle modifications today with an emphasis on weight loss as well as increasing exercise and decreasing simple carbohydrates in her diet.  Dyspepsia Ameliarose agrees to start Dexilant 60 mg PO daily #30 with no refills. We have sent a referral to [facility] for evaluation. Cecille agrees to follow up with our clinic in 2 weeks.  Obesity Undra is currently in the action stage of change. As such, her goal is to continue with weight loss efforts She has agreed to follow the Category 1 plan + 100-200 calorie snacks in the morning Jamiyah has been instructed to work up to a goal of 150 minutes of combined cardio and strengthening exercise per week or as tolerated for weight loss and overall health benefits. We discussed the following Behavioral Modification Strategies today: increasing lean protein intake, decreasing simple carbohydrates, increasing vegetables, increase H20 intake, and holiday eating strategies  Idonia will focus on resolving dyspepsia in order to move forward with diet plan.  Jeffery has agreed to follow up with our clinic in 2 weeks. She was informed of the importance of frequent follow up visits to maximize her success with intensive lifestyle modifications for her multiple health conditions.  ALLERGIES: Allergies  Allergen Reactions  . Penicillins Hives and Nausea And Vomiting    Has patient had a PCN reaction causing immediate rash, facial/tongue/throat swelling, SOB or lightheadedness with hypotension: no Has patient had a PCN reaction causing severe rash involving mucus membranes or skin necrosis: unknown Has patient had a PCN reaction that required hospitalization unknown Has patient had a PCN reaction occurring within the last 10 years: unknown If all of the above answers are "NO", then may proceed with Cephalosporin use.     MEDICATIONS: Current Outpatient Medications  on File Prior to Visit  Medication Sig Dispense Refill  . acetaminophen (TYLENOL) 500 MG tablet Take by mouth every 6 (six) hours as needed.    Marland Kitchen buPROPion (WELLBUTRIN XL) 150 MG 24 hr tablet Take 1 tablet (150 mg total) by mouth daily. 30 tablet 3  . FLUoxetine (PROZAC) 40 MG capsule TAKE 1 CAPSULE BY MOUTH EVERY DAY 90 capsule 1  . pantoprazole (PROTONIX) 40 MG tablet Take 1 tablet (40 mg total) by mouth 2 (two) times daily. 60 tablet 3  . sucralfate (CARAFATE) 1 g tablet Take 1 tablet (1 g total) by mouth 4 (four) times daily -  with meals and at bedtime. 120 tablet 0  . traZODone (DESYREL) 50 MG tablet TAKE 1/2 TO 1 TABLETS (25-50 MG TOTAL) BY MOUTH AT BEDTIME AS NEEDED FOR SLEEP. 30 tablet 2   No current facility-administered medications on file prior to visit.     PAST MEDICAL HISTORY: Past Medical History:  Diagnosis Date  . Anxiety   . Chicken pox   . Depression   . Gallbladder problem   . GERD (gastroesophageal reflux disease)   . PONV (postoperative nausea and vomiting)    vomiting after surgery gallbladder removal 2016  . Vitamin D deficiency     PAST SURGICAL HISTORY: Past  Surgical History:  Procedure Laterality Date  . CHOLECYSTECTOMY     laprascopic  . CYST EXCISION Right 12/29/2016   Procedure: EXCISION OF RIGHT AXILLARY CYST;  Surgeon: [name], MD;  Location: [facility];  Service: General;  Laterality: Right;  . FOOT SURGERY  02/2015   scar tissue and keloid scar removal  . FOREIGN BODY REMOVAL Right 09/17/2013   Procedure: REMOVAL FOREIGN BODY NEEDLE PLANTAR ASPECT OF THE FOOT;  Surgeon: Newt Minion, MD;  Location: [facility];  Service: Orthopedics;  Laterality: Right;  Remove Foreign Body Right Foot  . WISDOM TOOTH EXTRACTION      SOCIAL HISTORY: Social History   Tobacco Use  . Smoking status: Never Smoker  . Smokeless tobacco: Never Used  Substance Use Topics  . Alcohol use: Yes    Comment: occasionally  . Drug use: No    FAMILY HISTORY: Family  History  Problem Relation Age of Onset  . Diabetes Mother   . Hypertension Mother   . Thyroid disease Mother   . Obesity Mother   . Stroke Maternal Grandmother   . Cancer Maternal Grandfather        lung  . Diabetes Maternal Grandfather   . Stroke Maternal Grandfather   . Hypertension Maternal Grandfather     ROS: Review of Systems  Constitutional: Positive for weight loss.  Gastrointestinal: Negative for nausea and vomiting.  Genitourinary: Negative for frequency.  Musculoskeletal:       Negative muscle weakness  Endo/Heme/Allergies: Negative for polydipsia.    PHYSICAL EXAM: Blood pressure (!) 151/84, pulse (!) 108, temperature 98.8 F (37.1 C), temperature source Oral, height 5\' 4"  (1.626 m), weight 267 lb (121.1 kg), last menstrual period 08/10/2019, SpO2 99 %. Body mass index is 45.83 kg/m. Physical Exam Vitals signs reviewed.  Constitutional:      Appearance: Normal appearance. She is obese.  Cardiovascular:     Rate and Rhythm: Normal rate.     Pulses: Normal pulses.  Pulmonary:     Effort: Pulmonary effort is normal.     Breath sounds: Normal breath sounds.  Musculoskeletal: Normal range of motion.  Skin:    General: Skin is warm and dry.  Neurological:     Mental Status: She is alert and oriented to person, place, and time.  Psychiatric:        Mood and Affect: Mood normal.        Behavior: Behavior normal.     RECENT LABS AND TESTS: BMET    Component Value Date/Time   NA 138 08/19/2019 1145   K 4.3 08/19/2019 1145   CL 104 08/19/2019 1145   CO2 23 08/19/2019 1145   GLUCOSE 84 08/19/2019 1145   GLUCOSE 94 05/09/2018 0933   BUN 8 08/19/2019 1145   CREATININE 0.50 (L) 08/19/2019 1145   CREATININE 0.72 03/22/2018 1626   CALCIUM 9.6 08/19/2019 1145   GFRNONAA 129 08/19/2019 1145   GFRAA 149 08/19/2019 1145   Lab Results  Component Value Date   HGBA1C 5.5 08/19/2019   HGBA1C 5.4 08/13/2017   Lab Results  Component Value Date   INSULIN 16.7  08/19/2019   CBC    Component Value Date/Time   WBC 5.9 08/19/2019 1145   WBC 6.5 05/09/2018 0933   RBC 3.99 08/19/2019 1145   RBC 4.21 05/09/2018 0933   HGB 10.9 (L) 08/19/2019 1145   HCT 33.5 (L) 08/19/2019 1145   PLT 371 08/19/2019 1145   MCV 84 08/19/2019 1145   MCH  27.3 08/19/2019 1145   MCH 26.3 (L) 03/22/2018 1626   MCHC 32.5 08/19/2019 1145   MCHC 32.1 05/09/2018 0933   RDW 13.7 08/19/2019 1145   LYMPHSABS 1.4 08/19/2019 1145   MONOABS 0.5 05/09/2018 0933   EOSABS 0.1 08/19/2019 1145   BASOSABS 0.0 08/19/2019 1145   Iron/TIBC/Ferritin/ %Sat No results found for: IRON, TIBC, FERRITIN, IRONPCTSAT Lipid Panel     Component Value Date/Time   CHOL 156 08/19/2019 1145   TRIG 57 08/19/2019 1145   HDL 55 08/19/2019 1145   CHOLHDL 3.2 03/22/2018 1626   VLDL 9.4 02/21/2013 1050   LDLCALC 89 08/19/2019 1145   LDLCALC 83 03/22/2018 1626   Hepatic Function Panel     Component Value Date/Time   PROT 7.5 08/19/2019 1145   ALBUMIN 3.9 08/19/2019 1145   AST 18 08/19/2019 1145   ALT 13 08/19/2019 1145   ALKPHOS 120 (H) 08/19/2019 1145   BILITOT 0.2 08/19/2019 1145   BILIDIR 0.1 03/22/2018 1626   IBILI 0.1 (L) 03/22/2018 1626      Component Value Date/Time   TSH 0.853 08/19/2019 1145   TSH 1.09 03/22/2018 1626   TSH 1.15 08/13/2017 1549      OBESITY BEHAVIORAL INTERVENTION VISIT  Today's visit was # 2   Starting weight: 271 lbs Starting date: 08/19/2019 Today's weight : 267 lbs Today's date: 09/02/2019 Total lbs lost to date: 4    ASK: We discussed the diagnosis of obesity with Dolores [name] today and Shereta agreed to give [place] permission to discuss obesity behavioral modification therapy today.  ASSESS: Elias has the diagnosis of obesity and her BMI today is 45.81 Marvelyn is in the action stage of change   ADVISE: Icesis was educated on the multiple health risks of obesity as well as the benefit of weight loss to improve her health. She was  advised of the need for long term treatment and the importance of lifestyle modifications to improve her current health and to decrease her risk of future health problems.  AGREE: Multiple dietary modification options and treatment options were discussed and  Fizza agreed to follow the recommendations documented in the above note.  ARRANGE: Smriti was educated on the importance of frequent visits to treat obesity as outlined per CMS and USPSTF guidelines and agreed to schedule her next follow up appointment today.  Wilhemena Durie, am acting as transcriptionist for Briscoe Deutscher, DO  I have reviewed the above documentation for accuracy and completeness, and I agree with the above. Briscoe Deutscher, DO

## 2019-09-19 ENCOUNTER — Ambulatory Visit (INDEPENDENT_AMBULATORY_CARE_PROVIDER_SITE_OTHER): Payer: BC Managed Care – PPO

## 2019-09-19 ENCOUNTER — Encounter: Payer: Self-pay | Admitting: Gastroenterology

## 2019-09-19 ENCOUNTER — Other Ambulatory Visit: Payer: Self-pay

## 2019-09-19 ENCOUNTER — Ambulatory Visit (INDEPENDENT_AMBULATORY_CARE_PROVIDER_SITE_OTHER): Payer: BC Managed Care – PPO | Admitting: Physician Assistant

## 2019-09-19 ENCOUNTER — Encounter: Payer: Self-pay | Admitting: Physician Assistant

## 2019-09-19 VITALS — BP 116/78 | HR 97 | Temp 98.2°F | Ht 64.0 in | Wt 267.0 lb

## 2019-09-19 DIAGNOSIS — K219 Gastro-esophageal reflux disease without esophagitis: Secondary | ICD-10-CM

## 2019-09-19 DIAGNOSIS — R1013 Epigastric pain: Secondary | ICD-10-CM | POA: Diagnosis not present

## 2019-09-19 DIAGNOSIS — Z1159 Encounter for screening for other viral diseases: Secondary | ICD-10-CM

## 2019-09-19 LAB — SARS CORONAVIRUS 2 (TAT 6-24 HRS): SARS Coronavirus 2: NEGATIVE

## 2019-09-19 MED ORDER — FAMOTIDINE 40 MG PO TABS: 40.0000 mg | ORAL_TABLET | Freq: Two times a day (BID) | ORAL | 3 refills | Status: DC

## 2019-09-19 NOTE — Patient Instructions (Signed)
If you are age 31 or older, your body mass index should be between 23-30. Your Body mass index is 45.83 kg/m. If this is out of the aforementioned range listed, please consider follow up with your Primary Care Provider.  If you are age 79 or younger, your body mass index should be between 19-25. Your Body mass index is 45.83 kg/m. If this is out of the aformentioned range listed, please consider follow up with your Primary Care Provider.   You have been scheduled for an endoscopy. Please follow written instructions given to you at your visit today. If you use inhalers (even only as needed), please bring them with you on the day of your procedure.  Please stop using Carafate. Continue with Pantoprazole.  We have sent the following medications to your pharmacy for you to pick up at your convenience:  Pepcid 40 mg take twice daily.  Thank you for choosing me and [facility]

## 2019-09-19 NOTE — Progress Notes (Signed)
I agree with the above note, plan.  BMI 46 may contribute to her GI symptoms.

## 2019-09-19 NOTE — Progress Notes (Signed)
Chief Complaint: GERD, epigastric pain  HPI:    Krystal Harris is a 33 year old African-American female with a past medical history as listed below, who was referred to me by Briscoe Deutscher, DO for a complaint of GERD and epigastric pain.      08/19/2019 CMP normal.  Lipid panel normal.  Vitamin D low.  CBC with a hemoglobin 10.9 (this is patient's baseline).  Other labs normal.    Today, the patient tells me that she has seen Dr. Collene Mares in the past and eventually had a cholecystectomy a few years ago given decreased gallbladder function.  Has suffered with reflux symptoms since early 2019.  It was recommended she have an EGD but patient was worried about cost at Dr. Lorie Apley office.      Explains that she has continued with reflux and burning in her stomach which is sometimes worse when she has not eaten anything.  Over the past 6 to 7 months has also started with what feels like a chest pain/epigastric pain and pain that radiates through to her back.  This pain is rated as a 6-7/10 and sometimes worse on an empty stomach.  Patient was on Omeprazole 40 mg twice daily and Carafate 4 times daily for a year which was not helping.  This was recently changed to Protonix 40 twice daily and Carafate 4 times daily by her PCP but patient tells me she feels no relief with this medicine.  Does tell me she is taking the Carafate and Protonix together along with some other pills in the morning and in the evening.  Denies nausea or vomiting.    Denies fever, chills, unintentional weight loss, nausea, vomiting, dysphagia or change in bowel habits.  Past Medical History:  Diagnosis Date  . Anxiety   . Chicken pox   . Depression   . Gallbladder problem   . GERD (gastroesophageal reflux disease)   . PONV (postoperative nausea and vomiting)    vomiting after surgery gallbladder removal 2016  . Vitamin D deficiency     Past Surgical History:  Procedure Laterality Date  . CHOLECYSTECTOMY     laprascopic  . CYST  EXCISION Right 12/29/2016   Procedure: EXCISION OF RIGHT AXILLARY CYST;  Surgeon: [name], MD;  Location: [facility];  Service: General;  Laterality: Right;  . FOOT SURGERY  02/2015   scar tissue and keloid scar removal  . FOREIGN BODY REMOVAL Right 09/17/2013   Procedure: REMOVAL FOREIGN BODY NEEDLE PLANTAR ASPECT OF THE FOOT;  Surgeon: Newt Minion, MD;  Location: [facility];  Service: Orthopedics;  Laterality: Right;  Remove Foreign Body Right Foot  . WISDOM TOOTH EXTRACTION      Current Outpatient Medications  Medication Sig Dispense Refill  . acetaminophen (TYLENOL) 500 MG tablet Take by mouth every 6 (six) hours as needed.    Marland Kitchen buPROPion (WELLBUTRIN XL) 150 MG 24 hr tablet Take 1 tablet (150 mg total) by mouth daily. 30 tablet 3  . ferrous sulfate 325 (65 FE) MG tablet Take 1 tablet (325 mg total) by mouth daily with breakfast. 30 tablet 3  . FLUoxetine (PROZAC) 40 MG capsule TAKE 1 CAPSULE BY MOUTH EVERY DAY 90 capsule 1  . pantoprazole (PROTONIX) 40 MG tablet Take 40 mg by mouth daily.    . sucralfate (CARAFATE) 1 g tablet Take 1 tablet (1 g total) by mouth 4 (four) times daily -  with meals and at bedtime. 120 tablet 0  . traZODone (DESYREL) 50 MG  tablet TAKE 1/2 TO 1 TABLETS (25-50 MG TOTAL) BY MOUTH AT BEDTIME AS NEEDED FOR SLEEP. 30 tablet 2  . Vitamin D, Ergocalciferol, (DRISDOL) 1.25 MG (50000 UT) CAPS capsule Take 1 capsule (50,000 Units total) by mouth every 7 (seven) days. 4 capsule 0   No current facility-administered medications for this visit.     Allergies as of 09/19/2019 - Review Complete 09/19/2019  Allergen Reaction Noted  . Penicillins Hives and Nausea And Vomiting 04/07/2012    Family History  Problem Relation Age of Onset  . Diabetes Mother   . Hypertension Mother   . Thyroid disease Mother   . Obesity Mother   . Stroke Maternal Grandmother   . Diabetes Maternal Grandfather   . Stroke Maternal Grandfather   . Hypertension Maternal Grandfather   . Lung  cancer Maternal Grandfather   . Stomach cancer Neg Hx   . Colon cancer Neg Hx   . Rectal cancer Neg Hx   . Pancreatic cancer Neg Hx     Social History   Socioeconomic History  . Marital status: Single    Spouse name: Not on file  . Number of children: Not on file  . Years of education: Not on file  . Highest education level: Not on file  Occupational History  . Not on file  Social Needs  . Financial resource strain: Not on file  . Food insecurity    Worry: Not on file    Inability: Not on file  . Transportation needs    Medical: Not on file    Non-medical: Not on file  Tobacco Use  . Smoking status: Never Smoker  . Smokeless tobacco: Never Used  Substance and Sexual Activity  . Alcohol use: Yes    Comment: occasionally  . Drug use: No  . Sexual activity: Yes  Lifestyle  . Physical activity    Days per week: Not on file    Minutes per session: Not on file  . Stress: Not on file  Relationships  . Social Herbalist on phone: Not on file    Gets together: Not on file    Attends religious service: Not on file    Active member of club or organization: Not on file    Attends meetings of clubs or organizations: Not on file    Relationship status: Not on file  . Intimate partner violence    Fear of current or ex partner: Not on file    Emotionally abused: Not on file    Physically abused: Not on file    Forced sexual activity: Not on file  Other Topics Concern  . Not on file  Social History Narrative  . Not on file    Review of Systems:    Constitutional: No weight loss, fever or chills Skin: No rash Cardiovascular: No chest pain Respiratory: No SOB Gastrointestinal: See HPI and otherwise negative Genitourinary: No dysuria  Neurological: No headache, dizziness or syncope Musculoskeletal: No new muscle or joint pain Hematologic: No bleeding  Psychiatric: No history of depression or anxiety   Physical Exam:  Vital signs: BP 116/78   Pulse 97    Temp 98.2 F (36.8 C)   Ht 5\' 4"  (1.626 m)   Wt 267 lb (121.1 kg)   BMI 45.83 kg/m   Constitutional:   Pleasant obese AA female appears to be in NAD, Well developed, Well nourished, alert and cooperative Head:  Normocephalic and atraumatic. Eyes:   PEERL, EOMI. No  icterus. Conjunctiva pink. Ears:  Normal auditory acuity. Neck:  Supple Throat: Oral cavity and pharynx without inflammation, swelling or lesion.  Respiratory: Respirations even and unlabored. Lungs clear to auscultation bilaterally.   No wheezes, crackles, or rhonchi.  Cardiovascular: Normal S1, S2. No MRG. Regular rate and rhythm. No peripheral edema, cyanosis or pallor.  Gastrointestinal:  Soft, nondistended, moderate epigastric ttp. No rebound or guarding. Normal bowel sounds. No appreciable masses or hepatomegaly. Rectal:  Not performed.  Msk:  Symmetrical without gross deformities. Without edema, no deformity or joint abnormality.  Neurologic:  Alert and  oriented x4;  grossly normal neurologically.  Skin:   Dry and intact without significant lesions or rashes. Psychiatric: Demonstrates good judgement and reason without abnormal affect or behaviors.  RELEVANT LABS AND IMAGING: CBC    Component Value Date/Time   WBC 5.9 08/19/2019 1145   WBC 6.5 05/09/2018 0933   RBC 3.99 08/19/2019 1145   RBC 4.21 05/09/2018 0933   HGB 10.9 (L) 08/19/2019 1145   HCT 33.5 (L) 08/19/2019 1145   PLT 371 08/19/2019 1145   MCV 84 08/19/2019 1145   MCH 27.3 08/19/2019 1145   MCH 26.3 (L) 03/22/2018 1626   MCHC 32.5 08/19/2019 1145   MCHC 32.1 05/09/2018 0933   RDW 13.7 08/19/2019 1145   LYMPHSABS 1.4 08/19/2019 1145   MONOABS 0.5 05/09/2018 0933   EOSABS 0.1 08/19/2019 1145   BASOSABS 0.0 08/19/2019 1145    CMP     Component Value Date/Time   NA 138 08/19/2019 1145   K 4.3 08/19/2019 1145   CL 104 08/19/2019 1145   CO2 23 08/19/2019 1145   GLUCOSE 84 08/19/2019 1145   GLUCOSE 94 05/09/2018 0933   BUN 8 08/19/2019 1145    CREATININE 0.50 (L) 08/19/2019 1145   CREATININE 0.72 03/22/2018 1626   CALCIUM 9.6 08/19/2019 1145   PROT 7.5 08/19/2019 1145   ALBUMIN 3.9 08/19/2019 1145   AST 18 08/19/2019 1145   ALT 13 08/19/2019 1145   ALKPHOS 120 (H) 08/19/2019 1145   BILITOT 0.2 08/19/2019 1145   GFRNONAA 129 08/19/2019 1145   GFRAA 149 08/19/2019 1145    Assessment: 1.  Epigastric pain: With below 2.  GERD: For the past 2 years, uncontrolled on a twice daily PPI and Carafate, she was taking these together, likely Pantoprazole not being absorbed, H. pylori antibody recently negative; consider gastritis+/-PUD+/-H. pylori  Plan: 1.  Schedule patient for an EGD in the [facility] with Dr. Ardis Hughs.  Did discuss risk, benefits, limitations and alternatives and patient agrees to proceed. 2.  Discussed proper timing of medications with the patient.  She was taking her Carafate along with her Protonix which is likely decreasing absorption of this medicine.  Discontinue Carafate for now. 3.  Continue pantoprazole 40 mg twice daily, 36 minutes for breakfast and dinner 4.  Prescribed Pepcid 40 mg twice daily, every morning and nightly #60 with 3 refills 5.  Reviewed antireflux diet and lifestyle modifications 6.  Reviewed patient's recent labs with her. 7.  Patient to follow in clinic per recommendations from Dr. Ardis Hughs after time procedure.  Ellouise Newer, PA-C [facility] 09/19/2019, 10:59 AM  Cc: Briscoe Deutscher, DO

## 2019-09-23 ENCOUNTER — Ambulatory Visit (INDEPENDENT_AMBULATORY_CARE_PROVIDER_SITE_OTHER): Payer: BC Managed Care – PPO | Admitting: Family Medicine

## 2019-09-23 ENCOUNTER — Encounter: Payer: Self-pay | Admitting: Gastroenterology

## 2019-09-23 ENCOUNTER — Other Ambulatory Visit: Payer: Self-pay

## 2019-09-23 ENCOUNTER — Ambulatory Visit (AMBULATORY_SURGERY_CENTER): Payer: BC Managed Care – PPO | Admitting: Gastroenterology

## 2019-09-23 VITALS — BP 122/80 | HR 83 | Temp 98.6°F | Resp 19 | Ht 64.0 in | Wt 264.0 lb

## 2019-09-23 DIAGNOSIS — K2100 Gastro-esophageal reflux disease with esophagitis, without bleeding: Secondary | ICD-10-CM

## 2019-09-23 MED ORDER — SODIUM CHLORIDE 0.9 % IV SOLN: 500.0000 mL | Freq: Once | INTRAVENOUS | Status: DC

## 2019-09-23 NOTE — Progress Notes (Signed)
A and O x3. Report to RN. Tolerated MAC anesthesia well.Teeth unchanged after procedure.

## 2019-09-23 NOTE — Patient Instructions (Signed)
YOU HAD AN ENDOSCOPIC PROCEDURE TODAY AT [facility]:   Refer to the procedure report that was given to you for any specific questions about what was found during the examination.  If the procedure report does not answer your questions, please call your gastroenterologist to clarify.  If you requested that your care partner not be given the details of your procedure findings, then the procedure report has been included in a sealed envelope for you to review at your convenience later.  YOU SHOULD EXPECT: Some feelings of bloating in the abdomen. Passage of more gas than usual.  Walking can help get rid of the air that was put into your GI tract during the procedure and reduce the bloating. If you had a lower endoscopy (such as a colonoscopy or flexible sigmoidoscopy) you may notice spotting of blood in your stool or on the toilet paper. If you underwent a bowel prep for your procedure, you may not have a normal bowel movement for a few days.  Please Note:  You might notice some irritation and congestion in your nose or some drainage.  This is from the oxygen used during your procedure.  There is no need for concern and it should clear up in a day or so.  SYMPTOMS TO REPORT IMMEDIATELY:     Following upper endoscopy (EGD)  Vomiting of blood or coffee ground material  New chest pain or pain under the shoulder blades  Painful or persistently difficult swallowing  New shortness of breath  Fever of 100F or higher  Black, tarry-looking stools  For urgent or emergent issues, a gastroenterologist can be reached at any hour by calling [phone].   DIET:  We do recommend a small meal at first, but then you may proceed to your regular diet.  Drink plenty of fluids but you should avoid alcoholic beverages for 24 hours.  ACTIVITY:  You should plan to take it easy for the rest of today and you should NOT DRIVE or use heavy machinery until tomorrow (because of the sedation medicines  used during the test).    FOLLOW UP: Our staff will call the number listed on your records 48-72 hours following your procedure to check on you and address any questions or concerns that you may have regarding the information given to you following your procedure. If we do not reach you, we will leave a message.  We will attempt to reach you two times.  During this call, we will ask if you have developed any symptoms of COVID 19. If you develop any symptoms (ie: fever, flu-like symptoms, shortness of breath, cough etc.) before then, please call [phone].  If you test positive for Covid 19 in the 2 weeks post procedure, please call and report this information to [place].    If any biopsies were taken you will be contacted by phone or by letter within the next 1-3 weeks.  Please call us at [phone] if you have not heard about the biopsies in 3 weeks.    SIGNATURES/CONFIDENTIALITY: You and/or your care partner have signed paperwork which will be entered into your electronic medical record.  These signatures attest to the fact that that the information above on your After Visit Summary has been reviewed and is understood.  Full responsibility of the confidentiality of this discharge information lies with you and/or your care-partner.   Take protonix 40 mg 20-30 minutes before breakfast and dinner,change pepcid 20 mg at bedtime every night,call Dr. Ardis Hughs  office 5-6 weeks to report response. Resume remainder pf medications. Information given on reflux.

## 2019-09-23 NOTE — Progress Notes (Signed)
Pt's states no medical or surgical changes since previsit or office visit.   CW vitals, LC temp and SM IV.

## 2019-09-23 NOTE — Op Note (Signed)
[facility] Patient Name: Krystal Harris Procedure Date: 09/23/2019 2:45 PM MRN: [id number] Endoscopist: Milus Banister , MD Age: 33 Referring MD:  Date of Birth: 1986-04-11 Gender: Female Account #: [id number] Procedure:                Upper GI endoscopy Indications:              Epigastric abdominal pain, Heartburn Medicines:                Monitored Anesthesia Care Procedure:                Pre-Anesthesia Assessment:                           - Prior to the procedure, a History and Physical                            was performed, and patient medications and                            allergies were reviewed. The patient's tolerance of                            previous anesthesia was also reviewed. The risks                            and benefits of the procedure and the sedation                            options and risks were discussed with the patient.                            All questions were answered, and informed consent                            was obtained. Prior Anticoagulants: The patient has                            taken no previous anticoagulant or antiplatelet                            agents. ASA Grade Assessment: III - A patient with                            severe systemic disease. After reviewing the risks                            and benefits, the patient was deemed in                            satisfactory condition to undergo the procedure.                           After obtaining informed consent, the endoscope was  passed under direct vision. Throughout the                            procedure, the patient's blood pressure, pulse, and                            oxygen saturations were monitored continuously. The                            Endoscope was introduced through the mouth, and                            advanced to the second part of duodenum. The upper                            GI endoscopy  was accomplished without difficulty.                            The patient tolerated the procedure well. Scope In: Scope Out: Findings:                 The esophagus was normal.                           The stomach was normal.                           The examined duodenum was normal. Complications:            No immediate complications. Estimated blood loss:                            None. Estimated Blood Loss:     Estimated blood loss: none. Impression:               - Normal UGI tract                           - You symptoms are from NON EROSIVE reflux. Recommendation:           - Patient has a contact number available for                            emergencies. The signs and symptoms of potential                            delayed complications were discussed with the                            patient. Return to normal activities tomorrow.                            Written discharge instructions were provided to the                            patient.                           -  Resume previous diet.                           - Continue present medications. Protonix 40mg  one                            pill about 20-30 minutes before breakfast and                            dinner meals. Please change pepcid dosing, 20mg                             pill at bedtime every night. Call Dr. Ardis Hughs'                            office in 5-6 weeks to report on your response. Milus Banister, MD 09/23/2019 3:17:18 PM This report has been signed electronically.

## 2019-09-24 ENCOUNTER — Ambulatory Visit (INDEPENDENT_AMBULATORY_CARE_PROVIDER_SITE_OTHER): Payer: BC Managed Care – PPO | Admitting: Psychiatry

## 2019-09-24 DIAGNOSIS — F5102 Adjustment insomnia: Secondary | ICD-10-CM

## 2019-09-24 DIAGNOSIS — F4010 Social phobia, unspecified: Secondary | ICD-10-CM | POA: Diagnosis not present

## 2019-09-24 DIAGNOSIS — F341 Dysthymic disorder: Secondary | ICD-10-CM | POA: Diagnosis not present

## 2019-09-24 NOTE — Progress Notes (Signed)
PROBLEM-FOCUSED INITIAL PSYCHOTHERAPY EVALUATION [name], [name] [facility]  Name: Krystal Harris Date: 09/24/2019 Time spent: 50 min MRN: [id number] DOB: Sep 29, 1986 Guardian/Payee: self  PCP: [name], MD Documentation requested on this visit: No Teletherapy visit I connected with this patient by an approved telecommunication method (video), with her informed consent, and verifying identity and patient privacy.  I was located at my office and patient at her home.  As needed, we discussed the limitations, risks, and security and privacy concerns associated with telehealth service, including the availability and conditions which currently govern in-person appointments and the possibility that 3rd-party payment may not be fully guaranteed and she may be responsible for charges.  After she indicated understanding, we proceeded with the session.  Also discussed treatment planning, as needed, including ongoing verbal agreement with the plan, the opportunity to ask and answer all questions, her demonstrated understanding of instructions, and her readiness to call the office should symptoms worsen or she feels she is in a crisis state and needs more immediate and tangible assistance.  PROBLEM HISTORY Reason for Visit /Presenting Problem:  Chief Complaint  Patient presents with  . Establish Care  . Anxiety  Recommended to therapy by Lennart Pall, liked [place]'s profile and reviews online.  Telehealth b/c of endoscopy yesterday, fatigued.  Narrative/History of Present Illness Has anxiety about men, seeking female [place] in order to challenge herself about it.  Finds herself averse to them, tends to focus on dislikes, talk self into breaking up with them.  Last relationship long distance, cheated on her.  Knows she can what-if relationships, questioning things until she gets upset, and can do it with female friends, too.  Prone to question intentions, whether people like her,  read into things said and done until sh's worked herself into   Notable stress two years ago when sF revealed to her half-sister -- without consulting PT -- that he was not PT's bio F.  About 6 years ago, became part of a friend's (April) family, very entwined with them, joining for birthdays, holidays, travel.  Bunked with the family for a while after a roommate situation  Worries that she might be too invested in them, figures she may have partly worn out her welcome living with them for half a year, obsessing about whether she was truly welcome, pulling away from her husband.  April is like a mother figure, can make it too important sometimes whether she needs space, and April has found herself on some eggshells about how PT may take feedback.  Has learned not to text about feelings, because hypersensitivity and overreading words gets away with her.  Current stress with COVID conditions and anxiety, very cautious about virus exposure.  TG was hard, being by self.  Sleep has been eroded.  Got put on sertraline and trazodone, not effective.  Went on Prozac 40mg , added + Wellbutrin 150 XL, but now off them for a couple of months.  Only on 50mg  trazodone since 2019.  Has tried ZzzQuil, melatonin, not effective.  Controls anxiety by being busy, for the most part, but worry comes back when it's quiet.    Prior Psychiatric Assessment/Treatment:   Outpatient treatment: Alvester Chou, PhD most recently Psychiatric hospitalization: none stated Psychological assessment/testing: none stated   Abuse History: Victim of abuse: Not assessed at this time / none suspected.   Victim of neglect: Not assessed at this time / none suspected.   Perpetrator of abuse/neglect: Not assessed at this  time / none suspected.   Witness / Exposure to Domestic Violence: Not assessed at this time / none suspected.   Witness to Community Violence:  Not assessed at this time / none suspected.   Protective Services Involvement: No.    Report needed: No.    Substance Abuse History: Current substance abuse: Not assessed at this time / none suspected.   History of impactful substance use/abuse: Not assessed at this time / none suspected.     FAMILY/SOCIAL HISTORY Family of origin --  Mostly raised by As, [place], GPs.  Father had drug history, argumentative, yells a lot, opinionated, all the way back.  Moved in with M and F 31yo.  M was "workaholic", more missing.  PT moved out at 33yo,  Family of intention/current living situation -- lives alone, no stated hx of marriage or cohabitation.   Education -- deferred Vocation -- Works as a Pharmacist, hospital for [employer], 2nd grade.  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  All-remote this year, connecting well with students and parents but self-conscious about being among her peers, partly for being at a new school, partly for knowing she may be known as a friend of the principal.   Finances -- Current income from Employment, with no stated concerns. Spiritually -- deferred Enjoyable activities -- deferred Other situational factors affecting treatment and prognosis: Stressors from the following areas: Marital or family conflict Other: Peer conflict Barriers to service: none stated  Notable cultural sensitivities: none stated Strengths: Self Advocate   MED/SURG HISTORY Med/surg history was partially reviewed with PT at this time.  Endoscopy yesterday, has had gall bladder removed.  Leading theory she is having bile reflux.   Past Medical History:  Diagnosis Date  . Anxiety   . Chicken pox   . Depression   . Gallbladder problem   . GERD (gastroesophageal reflux disease)   . PONV (postoperative nausea and vomiting)    vomiting after surgery gallbladder removal 2016  . Vitamin D deficiency      Past Surgical History:  Procedure Laterality Date  . CHOLECYSTECTOMY     laprascopic  . CYST EXCISION Right 12/29/2016   Procedure: EXCISION OF RIGHT  AXILLARY CYST;  Surgeon: [name], MD;  Location: [facility];  Service: General;  Laterality: Right;  . FOOT SURGERY  02/2015   scar tissue and keloid scar removal  . FOREIGN BODY REMOVAL Right 09/17/2013   Procedure: REMOVAL FOREIGN BODY NEEDLE PLANTAR ASPECT OF THE FOOT;  Surgeon: Newt Minion, MD;  Location: [facility];  Service: Orthopedics;  Laterality: Right;  Remove Foreign Body Right Foot  . WISDOM TOOTH EXTRACTION      Allergies  Allergen Reactions  . Penicillins Hives and Nausea And Vomiting    Has patient had a PCN reaction causing immediate rash, facial/tongue/throat swelling, SOB or lightheadedness with hypotension: no Has patient had a PCN reaction causing severe rash involving mucus membranes or skin necrosis: unknown Has patient had a PCN reaction that required hospitalization unknown Has patient had a PCN reaction occurring within the last 10 years: unknown If all of the above answers are "NO", then may proceed with Cephalosporin use.     Medications (as listed in Epic): Current Outpatient Medications  Medication Sig Dispense Refill  . buPROPion (WELLBUTRIN XL) 150 MG 24 hr tablet Take 1 tablet (150 mg total) by mouth daily. (Patient not taking: Reported on 09/29/2019) 30 tablet 3  . famotidine (PEPCID) 40 MG  tablet Take 1 tablet (40 mg total) by mouth 2 (two) times daily. (Patient taking differently: Take 40 mg by mouth at bedtime. ) 60 tablet 3  . ferrous sulfate 325 (65 FE) MG tablet Take 1 tablet (325 mg total) by mouth daily with breakfast. 30 tablet 3  . FLUoxetine (PROZAC) 40 MG capsule TAKE 1 CAPSULE BY MOUTH EVERY DAY (Patient not taking: Reported on 09/29/2019) 90 capsule 1  . pantoprazole (PROTONIX) 40 MG tablet Take 40 mg by mouth daily.    . sucralfate (CARAFATE) 1 g tablet Take 1 tablet (1 g total) by mouth 4 (four) times daily -  with meals and at bedtime. (Patient not taking: Reported on 09/29/2019) 120 tablet 0  . traZODone (DESYREL) 50 MG tablet TAKE 1/2  TO 1 TABLETS (25-50 MG TOTAL) BY MOUTH AT BEDTIME AS NEEDED FOR SLEEP. 30 tablet 2  . Vitamin D, Ergocalciferol, (DRISDOL) 1.25 MG (50000 UT) CAPS capsule Take 1 capsule (50,000 Units total) by mouth every 7 (seven) days. (Patient not taking: Reported on 10/01/2019) 4 capsule 0   No current facility-administered medications for this visit.    MENTAL STATUS AND OBSERVATIONS Appearance:   Casual     Behavior:  Appropriate  Motor:  Normal  Speech/Language:   Clear and Coherent  Affect:  Appropriate  Mood:  anxious  Thought process:  normal  Thought content:    WNL  Sensory/Perceptual disturbances:    WNL  Orientation:  Fully oriented  Attention:  Good  Concentration:  Good  Memory:  WNL  Fund of knowledge:   Good  Insight:    Good  Judgment:   Good  Impulse Control:  Good  Initial Risk Assessment: Danger to Self: No Self-injurious Behavior: No Danger to Others: No Physical Aggression / Violence: No Duty to Warn: No Access to Firearms a concern: No Gang Involvement: No Patient / guardian was educated about steps to take if suicide or homicide risk level increases between visits: yes . While future psychiatric events cannot be accurately predicted, the patient does not currently require acute inpatient psychiatric care and does not currently meet [facility] involuntary commitment criteria.   DIAGNOSIS:    ICD-10-CM   1. Social anxiety disorder  F40.10   2. 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 Ethical orientation and informed consent re: o privacy rights -- including but not limited to HIPAA, EMR and use of e-PHI o patient responsibilities -- scheduling, fair notice of changes, in-person vs. telehealth and regulatory and financial conditions affecting choice o expectations for working relationship in psychotherapy o expectations and consents for working partnerships with other health care disciplines,  especially including medication and other behavioral health providers . Support/validation for distressing symptoms and confirmed rapport . Initial goalsetting: o Wants to be less averse to men o Wants to control worry over what people in general think of her and catastrophizing about whether she's on their bad side o Manage sleep, overcome insomnia . Idea for combatting what other people think -- if they don't say something negative but she feels like they mean it, consider assuming they either don't think it matters, either, or they felt it is an impulse they should suppress in themselves -- safe to disregard or try assuming they are  . Outlook for therapy -- scheduling constraints, availability of crisis service, inclusion of family member(s) as appropriate  Plan: . Look for opportunity to  consider assuming someone who does not say something negative is trying, unbeknownst to her, to subdue a thought/feeling, not disguise it . Return to other needs . Maintain medication as prescribed and work faithfully with relevant prescriber(s) if any changes are desired or seem indicated . Call the clinic on-call service, present to ER, or call 911 if any life-threatening psychiatric crisis Return for time as available.  Blanchie Serve, PhD  [name], [name] Clinical Psychologist, [facility] [address], [place], [place] [address] [phone]

## 2019-09-25 ENCOUNTER — Telehealth: Payer: Self-pay | Admitting: *Deleted

## 2019-09-25 NOTE — Telephone Encounter (Signed)
  Follow up Call-  Call back number 09/23/2019  Post procedure Call Back phone  # [phone]  Permission to leave phone message Yes  Some recent data might be hidden     Patient questions:  Do you have a fever, pain , or abdominal swelling? Yes.   Pain Score  4 *  Have you tolerated food without any problems? No.  Have you been able to return to your normal activities? No.  Do you have any questions about your discharge instructions: Diet   No. Medications  No. Follow up visit  No.  Do you have questions or concerns about your Care? No.  Actions: * If pain score is 4 or above: No action needed, pain <4. 1. Have you developed a fever since your procedure? no  2.   Have you had an respiratory symptoms (SOB or cough) since your procedure? no  3.   Have you tested positive for COVID 19 since your procedure no  4.   Have you had any family members/close contacts diagnosed with the COVID 19 since your procedure?  no   If yes to any of these questions please route to Joylene John, RN and Alphonsa Gin, Therapist, sports.

## 2019-09-29 ENCOUNTER — Emergency Department (HOSPITAL_COMMUNITY): Payer: BC Managed Care – PPO

## 2019-09-29 ENCOUNTER — Encounter (HOSPITAL_COMMUNITY): Payer: Self-pay

## 2019-09-29 ENCOUNTER — Emergency Department (HOSPITAL_COMMUNITY)
Admission: EM | Admit: 2019-09-29 | Discharge: 2019-09-30 | Disposition: A | Discharge status: Home / Self Care | Payer: BC Managed Care – PPO | Attending: Emergency Medicine | Admitting: Emergency Medicine

## 2019-09-29 ENCOUNTER — Other Ambulatory Visit: Payer: Self-pay

## 2019-09-29 ENCOUNTER — Telehealth: Payer: Self-pay | Admitting: Gastroenterology

## 2019-09-29 DIAGNOSIS — R1013 Epigastric pain: Secondary | ICD-10-CM | POA: Insufficient documentation

## 2019-09-29 DIAGNOSIS — Z79899 Other long term (current) drug therapy: Secondary | ICD-10-CM | POA: Diagnosis not present

## 2019-09-29 LAB — CBC
HCT: 38.4 % (ref 36.0–46.0)
Hemoglobin: 12 g/dL (ref 12.0–15.0)
MCH: 27.3 pg (ref 26.0–34.0)
MCHC: 31.3 g/dL (ref 30.0–36.0)
MCV: 87.3 fL (ref 80.0–100.0)
Platelets: 342 10*3/uL (ref 150–400)
RBC: 4.4 MIL/uL (ref 3.87–5.11)
RDW: 14 % (ref 11.5–15.5)
WBC: 9.9 10*3/uL (ref 4.0–10.5)
nRBC: 0 % (ref 0.0–0.2)

## 2019-09-29 LAB — BASIC METABOLIC PANEL
Anion gap: 10 (ref 5–15)
BUN: 10 mg/dL (ref 6–20)
CO2: 23 mmol/L (ref 22–32)
Calcium: 9.4 mg/dL (ref 8.9–10.3)
Chloride: 107 mmol/L (ref 98–111)
Creatinine, Ser: 0.82 mg/dL (ref 0.44–1.00)
GFR calc Af Amer: >60 mL/min (ref >60)
GFR calc non Af Amer: >60 mL/min (ref >60)
Glucose, Bld: 90 mg/dL (ref 70–99)
Potassium: 3.8 mmol/L (ref 3.5–5.1)
Sodium: 140 mmol/L (ref 135–145)

## 2019-09-29 LAB — TROPONIN I (HIGH SENSITIVITY)
Troponin I (High Sensitivity): <2 ng/L (ref <18)
Troponin I (High Sensitivity): <2 ng/L (ref <18)

## 2019-09-29 LAB — HEPATIC FUNCTION PANEL
ALT: 14 U/L (ref 0–44)
AST: 17 U/L (ref 15–41)
Albumin: 4.1 g/dL (ref 3.5–5.0)
Alkaline Phosphatase: 89 U/L (ref 38–126)
Bilirubin, Direct: <0.1 mg/dL (ref 0.0–0.2)
Total Bilirubin: 0.5 mg/dL (ref 0.3–1.2)
Total Protein: 8.7 g/dL — ABNORMAL HIGH (ref 6.5–8.1)

## 2019-09-29 LAB — I-STAT BETA HCG BLOOD, ED (NOT ORDERABLE): I-stat hCG, quantitative: <5 m[IU]/mL (ref <5)

## 2019-09-29 LAB — LIPASE, BLOOD: Lipase: 25 U/L (ref 11–51)

## 2019-09-29 MED ORDER — IOHEXOL 300 MG/ML  SOLN
100.0000 mL | Freq: Once | INTRAMUSCULAR | Status: AC | PRN
  Administered 2019-09-29: 23:00:00 100 mL via INTRAVENOUS

## 2019-09-29 MED ORDER — SODIUM CHLORIDE 0.9% FLUSH
3.0000 mL | Freq: Once | INTRAVENOUS | Status: AC
  Administered 2019-09-29: 3 mL via INTRAVENOUS

## 2019-09-29 MED ORDER — SODIUM CHLORIDE (PF) 0.9 % IJ SOLN
INTRAMUSCULAR | Status: AC
  Filled 2019-09-29: qty 50

## 2019-09-29 NOTE — Telephone Encounter (Signed)
#  [phone] Pt said she is returning your call

## 2019-09-29 NOTE — ED Triage Notes (Signed)
Pt has hx of acid reflux. However, pt states that she is having some burning in chest/epigastric are. Pt states that her chest area feels off and she is worried that it is more than GERD.

## 2019-09-29 NOTE — Telephone Encounter (Signed)
When I called the pt she was actually sitting in the ED for eval of abd/chest pain that has radiated to the back and is 8-9/10.  She states she will call if needed after ED eval.

## 2019-09-29 NOTE — Telephone Encounter (Signed)
The pt had an EGD on 09/23/19 and since then she has had abd and chest pain.  Left sided burning in the upper abd/chest area.  The pain also radiates to the back.  She takes protonix 40 mg BID 20-3- min prior to meals and also pepcid 20 mg at bedtime.  She has also been eating a modified diet; very bland.  Has tried gas x.  Has a lot of belching and flatulence.  Please advise

## 2019-09-29 NOTE — ED Provider Notes (Signed)
[facility] DEPT Provider Note   CSN: [id number] Arrival date & time: 09/29/19  1636     History Chief Complaint  Patient presents with   Gastroesophageal Reflux    Krystal Harris is a 33 y.o. female.  33 year old female presents with epigastric abdominal pain times several weeks.  History of cholecystectomy.  Had a EGD done recently which did not show any acute findings.  She has been on multiple oral medications.  Pain has increased today.  She had nausea but no vomiting.  No fever or chills.  Pain is when she attempts to swallow.  Called her gastroenterologist and those notes reviewed and recommend patient have abdominal pelvic CT.  She denies any black or bloody stools.        Past Medical History:  Diagnosis Date   Anxiety    Chicken pox    Depression    Gallbladder problem    GERD (gastroesophageal reflux disease)    PONV (postoperative nausea and vomiting)    vomiting after surgery gallbladder removal 2016   Vitamin D deficiency     Patient Active Problem List   Diagnosis Date Noted   GERD (gastroesophageal reflux disease) 05/01/2019   Knee osteoarthritis 09/12/2017   Anxiety 04/11/2017   Insomnia 04/11/2017   Fatty liver 09/27/2016    Past Surgical History:  Procedure Laterality Date   CHOLECYSTECTOMY     laprascopic   CYST EXCISION Right 12/29/2016   Procedure: EXCISION OF RIGHT AXILLARY CYST;  Surgeon: [name], MD;  Location: [facility];  Service: General;  Laterality: Right;   FOOT SURGERY  02/2015   scar tissue and keloid scar removal   FOREIGN BODY REMOVAL Right 09/17/2013   Procedure: REMOVAL FOREIGN BODY NEEDLE PLANTAR ASPECT OF THE FOOT;  Surgeon: Newt Minion, MD;  Location: [facility];  Service: Orthopedics;  Laterality: Right;  Remove Foreign Body Right Foot   WISDOM TOOTH EXTRACTION       OB History    Gravida  0   Para  0   Term  0   Preterm  0   AB  0   Living  0     SAB  0   TAB  0   Ectopic  0   Multiple  0   Live Births  0           Family History  Problem Relation Age of Onset   Diabetes Mother    Hypertension Mother    Thyroid disease Mother    Obesity Mother    Stroke Maternal Grandmother    Diabetes Maternal Grandfather    Stroke Maternal Grandfather    Hypertension Maternal Grandfather    Lung cancer Maternal Grandfather    Stomach cancer Neg Hx    Colon cancer Neg Hx    Rectal cancer Neg Hx    Pancreatic cancer Neg Hx     Social History   Tobacco Use   Smoking status: Never Smoker   Smokeless tobacco: Never Used  Substance Use Topics   Alcohol use: Yes    Comment: occasionally   Drug use: No    Home Medications Prior to Admission medications   Medication Sig Start Date End Date Taking? Authorizing Provider  acetaminophen (TYLENOL) 500 MG tablet Take by mouth every 6 (six) hours as needed.    [provider]  buPROPion (WELLBUTRIN XL) 150 MG 24 hr tablet Take 1 tablet (150 mg total) by mouth daily. 10/21/18   Tabori,  Aundra Millet, MD  famotidine (PEPCID) 40 MG tablet Take 1 tablet (40 mg total) by mouth 2 (two) times daily. Patient not taking: Reported on 09/23/2019 09/19/19   Levin Erp, PA  ferrous sulfate 325 (65 FE) MG tablet Take 1 tablet (325 mg total) by mouth daily with breakfast. 09/02/19   Briscoe Deutscher, DO  FLUoxetine (PROZAC) 40 MG capsule TAKE 1 CAPSULE BY MOUTH EVERY DAY 05/07/18   [name], MD  pantoprazole (PROTONIX) 40 MG tablet Take 40 mg by mouth daily.    [provider]  sucralfate (CARAFATE) 1 g tablet Take 1 tablet (1 g total) by mouth 4 (four) times daily -  with meals and at bedtime. 08/23/19   [name], MD  traZODone (DESYREL) 50 MG tablet TAKE 1/2 TO 1 TABLETS (25-50 MG TOTAL) BY MOUTH AT BEDTIME AS NEEDED FOR SLEEP. 06/30/19   [name], MD  Vitamin D, Ergocalciferol, (DRISDOL) 1.25 MG (50000 UT) CAPS capsule Take 1 capsule  (50,000 Units total) by mouth every 7 (seven) days. 09/02/19   Briscoe Deutscher, DO    Allergies    Penicillins  Review of Systems   Review of Systems  All other systems reviewed and are negative.   Physical Exam Updated Vital Signs BP (!) 145/83 (BP Location: Left Arm)    Pulse (!) 103    Temp 99.1 F (37.3 C) (Oral)    Resp 16    SpO2 100%   Physical Exam Vitals and nursing note reviewed.  Constitutional:      General: She is not in acute distress.    Appearance: Normal appearance. She is well-developed. She is not toxic-appearing.  HENT:     Head: Normocephalic and atraumatic.  Eyes:     General: Lids are normal.     Conjunctiva/sclera: Conjunctivae normal.     Pupils: Pupils are equal, round, and reactive to light.  Neck:     Thyroid: No thyroid mass.     Trachea: No tracheal deviation.  Cardiovascular:     Rate and Rhythm: Normal rate and regular rhythm.     Heart sounds: Normal heart sounds. No murmur. No gallop.   Pulmonary:     Effort: Pulmonary effort is normal. No respiratory distress.     Breath sounds: Normal breath sounds. No stridor. No decreased breath sounds, wheezing, rhonchi or rales.  Abdominal:     General: Bowel sounds are normal. There is no distension.     Palpations: Abdomen is soft.     Tenderness: There is abdominal tenderness in the epigastric area. There is no guarding or rebound.    Musculoskeletal:        General: No tenderness. Normal range of motion.     Cervical back: Normal range of motion and neck supple.  Skin:    General: Skin is warm and dry.     Findings: No abrasion or rash.  Neurological:     Mental Status: She is alert and oriented to person, place, and time.     GCS: GCS eye subscore is 4. GCS verbal subscore is 5. GCS motor subscore is 6.     Cranial Nerves: No cranial nerve deficit.     Sensory: No sensory deficit.  Psychiatric:        Speech: Speech normal.        Behavior: Behavior normal.     ED Results /  Procedures / Treatments   Labs (all labs ordered are listed, but only abnormal  results are displayed) Labs Reviewed  BASIC METABOLIC PANEL  CBC  LIPASE, BLOOD  HEPATIC FUNCTION PANEL  I-STAT BETA HCG BLOOD, ED ([facility], [facility], [facility] ONLY)  I-STAT BETA HCG BLOOD, ED (NOT ORDERABLE)  TROPONIN I (HIGH SENSITIVITY)  TROPONIN I (HIGH SENSITIVITY)    EKG EKG Interpretation  Date/Time:  Monday September 29 2019 16:48:29 EST Ventricular Rate:  110 PR Interval:    QRS Duration: 72 QT Interval:  328 QTC Calculation: 444 R Axis:   58 Text Interpretation: Sinus tachycardia Confirmed by Lacretia Leigh ([id number]) on 09/29/2019 9:56:57 PM   Radiology DG Chest 2 View  Result Date: 09/29/2019 CLINICAL DATA:  Chest pain EXAM: CHEST - 2 VIEW COMPARISON:  January 10, 2016 FINDINGS: The heart size and mediastinal contours are within normal limits. Both lungs are clear. The visualized skeletal structures are unremarkable. IMPRESSION: No active cardiopulmonary disease. Electronically Signed   By: Prudencio Pair M.D.   On: 09/29/2019 17:21    Procedures Procedures (including critical care time)  Medications Ordered in ED Medications  sodium chloride flush (NS) 0.9 % injection 3 mL (has no administration in time range)    ED Course  I have reviewed the triage vital signs and the nursing notes.  Pertinent labs & imaging results that were available during my care of the patient were reviewed by me and considered in my medical decision making (see chart for details).    MDM Rules/Calculators/A&P                     CT of abdomen along with chest x-ray and blood tests were all negative here.  Patient states that she has had symptoms now for almost a year.  Will refer her back to her GI doctor Final Clinical Impression(s) / ED Diagnoses Final diagnoses:  None    Rx / DC Orders ED Discharge Orders    None       Lacretia Leigh, MD 09/30/19 0002

## 2019-09-29 NOTE — Telephone Encounter (Signed)
Her EGD was completely normal last week.  Unclear what is causing her pains.  I think she should have a CT scan of the abdomen and pelvis with IV and oral contrast to continue the work-up.  Thank you.  She should continue her antiacid medicines as she is currently taking them.

## 2019-09-29 NOTE — Telephone Encounter (Signed)
Pt stated that she feels a burning sensation in her chest as if she is having a heart attack.  Pt had an EGD 09/23/19.  Please advise.

## 2019-09-30 ENCOUNTER — Other Ambulatory Visit: Payer: Self-pay | Admitting: Family Medicine

## 2019-10-01 ENCOUNTER — Ambulatory Visit (INDEPENDENT_AMBULATORY_CARE_PROVIDER_SITE_OTHER): Payer: BC Managed Care – PPO | Admitting: Family Medicine

## 2019-10-01 ENCOUNTER — Encounter: Payer: Self-pay | Admitting: Family Medicine

## 2019-10-01 ENCOUNTER — Other Ambulatory Visit: Payer: Self-pay

## 2019-10-01 VITALS — Temp 98.2°F | Ht 64.0 in | Wt 262.0 lb

## 2019-10-01 DIAGNOSIS — K219 Gastro-esophageal reflux disease without esophagitis: Secondary | ICD-10-CM

## 2019-10-01 DIAGNOSIS — E559 Vitamin D deficiency, unspecified: Secondary | ICD-10-CM

## 2019-10-01 DIAGNOSIS — R748 Abnormal levels of other serum enzymes: Secondary | ICD-10-CM

## 2019-10-01 NOTE — Progress Notes (Signed)
Virtual Visit via Video   I connected with patient on 10/01/19 at  2:00 PM EST by a video enabled telemedicine application and verified that I am speaking with the correct person using two identifiers.  Location patient: Home Location provider: Acupuncturist, Office Persons participating in the virtual visit: Patient, Provider, [place] (Jess B)  I discussed the limitations of evaluation and management by telemedicine and the availability of in person appointments. The patient expressed understanding and agreed to proceed.  Subjective:   HPI:   ER f/u- pt went to ER on 12/14 w/ epigastric pain.  Had normal CT abd/pelvis along w/ normal lab work.  They recommended she f/u w/ GI.  'acid reflux has been on like 1000 lately'.  Reports she is getting mixed messages on how to take her reflux medications.  Reports Dr Ardis Hughs told her to take Pepcid once daily and PA told her to take twice daily.  She wants to know how to proceed.  Obesity- started seeing Dr Juleen China.  Had lab work done and there 'were some concerns'.  Was started on Vit D and iron supplement based on results.  Pt was told that Alk phos was elevated and that it could potentially be bone cancer.  Pt is now really freaked out and wants to know what the next steps are.  ROS:   See pertinent positives and negatives per HPI.  Patient Active Problem List   Diagnosis Date Noted  . GERD (gastroesophageal reflux disease) 05/01/2019  . Knee osteoarthritis 09/12/2017  . Anxiety 04/11/2017  . Insomnia 04/11/2017  . Fatty liver 09/27/2016    Social History   Tobacco Use  . Smoking status: Never Smoker  . Smokeless tobacco: Never Used  Substance Use Topics  . Alcohol use: Yes    Comment: occasionally    Current Outpatient Medications:  .  famotidine (PEPCID) 40 MG tablet, Take 1 tablet (40 mg total) by mouth 2 (two) times daily. (Patient taking differently: Take 40 mg by mouth at bedtime. ), Disp: 60 tablet, Rfl: 3 .  ferrous  sulfate 325 (65 FE) MG tablet, Take 1 tablet (325 mg total) by mouth daily with breakfast., Disp: 30 tablet, Rfl: 3 .  pantoprazole (PROTONIX) 40 MG tablet, Take 40 mg by mouth daily., Disp: , Rfl:  .  traZODone (DESYREL) 50 MG tablet, TAKE 1/2 TO 1 TABLETS (25-50 MG TOTAL) BY MOUTH AT BEDTIME AS NEEDED FOR SLEEP., Disp: 30 tablet, Rfl: 2 .  buPROPion (WELLBUTRIN XL) 150 MG 24 hr tablet, Take 1 tablet (150 mg total) by mouth daily. (Patient not taking: Reported on 09/29/2019), Disp: 30 tablet, Rfl: 3 .  FLUoxetine (PROZAC) 40 MG capsule, TAKE 1 CAPSULE BY MOUTH EVERY DAY (Patient not taking: Reported on 09/29/2019), Disp: 90 capsule, Rfl: 1 .  sucralfate (CARAFATE) 1 g tablet, Take 1 tablet (1 g total) by mouth 4 (four) times daily -  with meals and at bedtime. (Patient not taking: Reported on 09/29/2019), Disp: 120 tablet, Rfl: 0 .  Vitamin D, Ergocalciferol, (DRISDOL) 1.25 MG (50000 UT) CAPS capsule, Take 1 capsule (50,000 Units total) by mouth every 7 (seven) days. (Patient not taking: Reported on 10/01/2019), Disp: 4 capsule, Rfl: 0  Allergies  Allergen Reactions  . Penicillins Hives and Nausea And Vomiting    Has patient had a PCN reaction causing immediate rash, facial/tongue/throat swelling, SOB or lightheadedness with hypotension: no Has patient had a PCN reaction causing severe rash involving mucus membranes or skin necrosis: unknown Has  patient had a PCN reaction that required hospitalization unknown Has patient had a PCN reaction occurring within the last 10 years: unknown If all of the above answers are "NO", then may proceed with Cephalosporin use.     Objective:   Temp 98.2 F (36.8 C) (Tympanic)   Ht _0  (1.626 m)   Wt 262 lb (118.8 kg)   LMP 09/08/2019   BMI 44.97 kg/m  AAOx3, NAD NCAT, EOMI No obvious CN deficits Coloring WNL Pt is able to speak clearly, coherently without shortness of breath or increased work of breathing.  Thought process is linear.  Mood is  appropriate.   Assessment and Plan:   GERD- ongoing issue.  Reviewed EGD report and GI notes.  Encouraged her to take Protonix BID and to start w/ Pepcid QHS and increase to BID if needed.  Pt expressed understanding and is in agreement w/ plan.   Elevated Alk Phos- new.  Pt's alk phos just slightly elevated at 120.  She is very concerned about this since bone cancer was mentioned.  Reassured her that this is highly unlikely w/ this value.  Pt to return for repeat testing and GGT.  Vit D deficiency- pt took 50,000 units x4 weeks and wants to know if she needs to extend her regimen.  Encouraged her to add OTC Vit D supplement of 5,000 units daily.  Pt expressed understanding and is in agreement w/ plan.    [name], MD 10/01/2019

## 2019-10-01 NOTE — Progress Notes (Signed)
I have discussed the procedure for the virtual visit with the patient who has given consent to proceed with assessment and treatment.   Krystal Harris L Madora Barletta, CMA     

## 2019-10-03 ENCOUNTER — Other Ambulatory Visit: Payer: Self-pay

## 2019-10-03 ENCOUNTER — Ambulatory Visit (INDEPENDENT_AMBULATORY_CARE_PROVIDER_SITE_OTHER): Payer: BC Managed Care – PPO

## 2019-10-03 DIAGNOSIS — R748 Abnormal levels of other serum enzymes: Secondary | ICD-10-CM

## 2019-10-03 NOTE — Addendum Note (Signed)
Addended by: Doran Clay A on: 10/03/2019 02:57 PM   Modules accepted: Orders

## 2019-10-04 LAB — HEPATIC FUNCTION PANEL
AG Ratio: 1 (calc) (ref 1.0–2.5)
ALT: 9 U/L (ref 6–29)
AST: 14 U/L (ref 10–30)
Albumin: 3.8 g/dL (ref 3.6–5.1)
Alkaline phosphatase (APISO): 95 U/L (ref 31–125)
Bilirubin, Direct: 0.1 mg/dL (ref 0.0–0.2)
Globulin: 3.7 g/dL (calc) (ref 1.9–3.7)
Indirect Bilirubin: 0.1 mg/dL (calc) — ABNORMAL LOW (ref 0.2–1.2)
Total Bilirubin: 0.2 mg/dL (ref 0.2–1.2)
Total Protein: 7.5 g/dL (ref 6.1–8.1)

## 2019-10-04 LAB — GAMMA GT: GGT: 19 U/L (ref 3–50)

## 2019-10-13 ENCOUNTER — Ambulatory Visit: Payer: BC Managed Care – PPO | Attending: Internal Medicine

## 2019-10-13 DIAGNOSIS — Z20822 Contact with and (suspected) exposure to covid-19: Secondary | ICD-10-CM

## 2019-10-15 LAB — NOVEL CORONAVIRUS, NAA: SARS-CoV-2, NAA: NOT DETECTED

## 2019-10-22 ENCOUNTER — Other Ambulatory Visit: Payer: Self-pay

## 2019-10-22 ENCOUNTER — Ambulatory Visit (INDEPENDENT_AMBULATORY_CARE_PROVIDER_SITE_OTHER): Payer: BC Managed Care – PPO | Admitting: Psychiatry

## 2019-10-22 DIAGNOSIS — F5102 Adjustment insomnia: Secondary | ICD-10-CM | POA: Diagnosis not present

## 2019-10-22 DIAGNOSIS — F341 Dysthymic disorder: Secondary | ICD-10-CM

## 2019-10-22 DIAGNOSIS — R69 Illness, unspecified: Secondary | ICD-10-CM

## 2019-10-22 DIAGNOSIS — F4010 Social phobia, unspecified: Secondary | ICD-10-CM

## 2019-10-22 NOTE — Progress Notes (Signed)
Psychotherapy Progress Note [facility] [name], [name]  Patient ID: Krystal Harris     MRN: [id number]     Therapy format: Individual psychotherapy Date: 10/22/2019      Start: 6:05p     Stop: 6:55p     Time Spent: 50 min Location: In-person   Session narrative (presenting needs, interim history, self-report of stressors and symptoms, applications of prior therapy, status changes, and interventions made in session) On greeting, PT reported she would rather have met by telehealth due to anxiety about COVID but felt she had to attend in person because of something in the way staff phrased it when she called to clarify the appointment.  Assured no, she has the right to change to telehealth, just need a little notice to set it up.  Pt seemed still uneasy, as if    About the same.  Rough not going home for Xmas.  Did visit April's house, nothing stated how it went.  Had a COVID scare, tested negative.  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  Has a very supportive principal, but received a tricky message from her recently; they have the kind of relationship where she can coach PT about her health (ask if she's eaten, prod her to get out of the house, etc.) and it is welcome, or so PT asserts, but recent exchange in text where PT was expressing upset about things working or not working, principal supportively confronted her how she can be her own enemy, and challenged to get herself out more.  PT saw truth in it but still felt intruded upon.   For sleep issue, has tried yellow light, not it's enough to "make" her sleep (unrealistic expectation).  Trazodone is effective.  Benadryl recently was even better.  Frustrated with sleep.  Attempted to reframe expectations for use of yellow light/blue filtering but tends to naysay, seeming to assume more than TX intends.  Discussion turned to negative thinking, as with the principal/friend's message.  Probed whether the dual relationship is  workable, PT seemed to affirm so.  Turned to thought process, admits she had some intense feelings, had to stifle anger.  Attempted to frame the foundational skill of noticing and re-evaluating thoughts and offered first step of just thought journaling when disturbed, but she quickly says she's tried journaling, it doesn't work.  Offered possibility of recognizing moments of agonizing/ruminating or otherwise distressing and just getting a change of scenery, similar to friend's advice, just to break up tendency to ruminate.  Lukewarm about the idea.  Seemed to lose rapport unexpectedly this session.  Mindful of her stated issues with men and with any more vulnerable relationship, and mindful of potential race and gender tension in the room, offered apology for the awkwardness, normalized the idea that we all can be more distressed by certain patterns of thinking and it's something she offered she contends with, and provided two brief resources Judie Petit' brief description of 10 cognitive distortions and TX's self-help worksheet for checking and countering negative self-talk).  Allowed to move on as desired.  Review of EHR also shows persistent GI problems with Dx GERD, history of cholecystectomy, and epigastric pain episode before Christmas.  PCP followup notes recommended for iron and vitamin D supplementation.  Notes suggest tendency to be hypersensitive to minor lab irregularities. (Alk Phosphate reading thought to be suggestive of bone cancer, but EHR shows physician reassuring otherwise in phone service.  Followup lab run 2 days after visit  patently normal.)    Therapeutic modalities: Cognitive Behavioral Therapy and Solution-Oriented/Positive Psychology  Mental Status/Observations:  Appearance:   Casual     Behavior:  Resistant  Motor:  Normal  Speech/Language:   Clear and Coherent  Affect:  Constricted  Mood:  anxious and irritable  Thought process:  normal  Thought content:    WNL   Sensory/Perceptual disturbances:    WNL  Orientation:  Fully oriented  Attention:  Good  Concentration:  Good  Memory:  WNL  Insight:    Good  Judgment:   Good  Impulse Control:  Good   Risk Assessment: Danger to Self: No Self-injurious Behavior: No Danger to Others: No Physical Aggression / Violence: No Duty to Warn: No Access to Firearms a concern: No  Assessment of progress:  mixed results -- seems to be exhibiting relationship issue eaely in treatment  Diagnosis:   ICD-10-CM   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 r/o Major Depressive Disorder  R69    Plan:  . As interested, look over cognitive distortions and Portable Cognitive Therapist worksheet  . Schedule via daytime phone call . Advise ahead of time if wants telehealth or if deciding against continuing treatment . Other recommendations/advice as noted above . Continue to utilize previously learned skills ad lib . Maintain medication as prescribed and work faithfully with relevant prescriber(s) if any changes are desired or seem indicated . Call the clinic on-call service, present to ER, or call 911 if any life-threatening psychiatric crisis Return in about 2 weeks (around 11/05/2019) for time as available. Current [facility] appointments: No future appointments.  Blanchie Serve, PhD [name], [name] Clinical Psychologist, [facility] [address] [place], [place] [address] [phone]

## 2019-10-27 ENCOUNTER — Ambulatory Visit: Payer: BC Managed Care – PPO | Attending: Internal Medicine

## 2019-10-27 DIAGNOSIS — Z20822 Contact with and (suspected) exposure to covid-19: Secondary | ICD-10-CM

## 2019-10-28 LAB — NOVEL CORONAVIRUS, NAA: SARS-CoV-2, NAA: NOT DETECTED

## 2019-11-08 ENCOUNTER — Other Ambulatory Visit: Payer: Self-pay | Admitting: Family Medicine

## 2019-11-27 ENCOUNTER — Encounter: Payer: Self-pay | Admitting: Family Medicine

## 2019-11-27 ENCOUNTER — Ambulatory Visit (INDEPENDENT_AMBULATORY_CARE_PROVIDER_SITE_OTHER): Payer: BC Managed Care – PPO | Admitting: Family Medicine

## 2019-11-27 ENCOUNTER — Other Ambulatory Visit: Payer: Self-pay

## 2019-11-27 VITALS — Temp 97.0°F | Wt 259.0 lb

## 2019-11-27 DIAGNOSIS — F419 Anxiety disorder, unspecified: Secondary | ICD-10-CM

## 2019-11-27 MED ORDER — ALPRAZOLAM 0.5 MG PO TABS: 0.5000 mg | ORAL_TABLET | Freq: Two times a day (BID) | ORAL | 1 refills | Status: DC | PRN

## 2019-11-27 MED ORDER — VENLAFAXINE HCL ER 37.5 MG PO CP24: 37.5000 mg | ORAL_CAPSULE | Freq: Every day | ORAL | 3 refills | Status: DC

## 2019-11-27 NOTE — Progress Notes (Signed)
Virtual Visit via Video   I connected with patient on 11/27/19 at  1:00 PM EST by a video enabled telemedicine application and verified that I am speaking with the correct person using two identifiers.  Location patient: Home Location provider: Acupuncturist, Office Persons participating in the virtual visit: Patient, Provider, [place] (Jess B)  I discussed the limitations of evaluation and management by telemedicine and the availability of in person appointments. The patient expressed understanding and agreed to proceed.  Subjective:   HPI:  Anxiety- 'my anxiety has been through the roof lately'.  Increased irritability.  Pt feels she is having 'anxiety attacks'.  In January had a COVID scare and she became so nervous that her heart was racing, chest was burning.  Thought about calling 911.  Once test results were negative, her sxs resolved.  Not sleeping at night- difficulty staying asleep.  Taking Trazodone nightly.  Stopped the Wellbutrin and Fluoxetine this summer.  Is depressed bc she's homesick, graduating this semester and no one can celebrate.  Is very isolated.  ROS:   See pertinent positives and negatives per HPI.  Patient Active Problem List   Diagnosis Date Noted  . GERD (gastroesophageal reflux disease) 05/01/2019  . Knee osteoarthritis 09/12/2017  . Anxiety 04/11/2017  . Insomnia 04/11/2017  . Fatty liver 09/27/2016    Social History   Tobacco Use  . Smoking status: Never Smoker  . Smokeless tobacco: Never Used  Substance Use Topics  . Alcohol use: Yes    Comment: occasionally    Current Outpatient Medications:  .  famotidine (PEPCID) 40 MG tablet, Take 1 tablet (40 mg total) by mouth 2 (two) times daily. (Patient taking differently: Take 40 mg by mouth at bedtime. ), Disp: 60 tablet, Rfl: 3 .  pantoprazole (PROTONIX) 40 MG tablet, TAKE 1 TABLET BY MOUTH TWICE A DAY, Disp: 60 tablet, Rfl: 2 .  traZODone (DESYREL) 50 MG tablet, TAKE 1/2 TO 1 TABLETS (25-50  MG TOTAL) BY MOUTH AT BEDTIME AS NEEDED FOR SLEEP., Disp: 30 tablet, Rfl: 2 .  buPROPion (WELLBUTRIN XL) 150 MG 24 hr tablet, Take 1 tablet (150 mg total) by mouth daily. (Patient not taking: Reported on 09/29/2019), Disp: 30 tablet, Rfl: 3 .  ferrous sulfate 325 (65 FE) MG tablet, Take 1 tablet (325 mg total) by mouth daily with breakfast. (Patient not taking: Reported on 11/27/2019), Disp: 30 tablet, Rfl: 3 .  FLUoxetine (PROZAC) 40 MG capsule, TAKE 1 CAPSULE BY MOUTH EVERY DAY (Patient not taking: Reported on 09/29/2019), Disp: 90 capsule, Rfl: 1 .  sucralfate (CARAFATE) 1 g tablet, Take 1 tablet (1 g total) by mouth 4 (four) times daily -  with meals and at bedtime. (Patient not taking: Reported on 09/29/2019), Disp: 120 tablet, Rfl: 0  Allergies  Allergen Reactions  . Penicillins Hives and Nausea And Vomiting    Has patient had a PCN reaction causing immediate rash, facial/tongue/throat swelling, SOB or lightheadedness with hypotension: no Has patient had a PCN reaction causing severe rash involving mucus membranes or skin necrosis: unknown Has patient had a PCN reaction that required hospitalization unknown Has patient had a PCN reaction occurring within the last 10 years: unknown If all of the above answers are "NO", then may proceed with Cephalosporin use.     Objective:   Temp (!) 97 F (36.1 C) (Skin)   Wt 259 lb (117.5 kg)   BMI 44.46 kg/m  AAOx3, NAD NCAT, EOMI No obvious CN deficits Coloring WNL Pt  is able to speak clearly, coherently without shortness of breath or increased work of breathing.  Thought process is linear.  Mood is appropriate.   Assessment and Plan:   Anxiety- deteriorated.  Pt had stopped Wellbutrin and Prozac this summer b/c she felt they were ineffective.  She is now having panic attacks.  Will start Venlafaxine daily and provide Alprazolam to use as needed for panicked moments.  Will follow closely.  Pt expressed understanding and is in agreement w/  plan.    [name], MD 11/27/2019

## 2019-11-27 NOTE — Progress Notes (Signed)
I have discussed the procedure for the virtual visit with the patient who has given consent to proceed with assessment and treatment.   Tonye Tancredi L Yenny Kosa, CMA     

## 2019-12-17 ENCOUNTER — Ambulatory Visit: Payer: BC Managed Care – PPO | Admitting: Nurse Practitioner

## 2019-12-17 ENCOUNTER — Other Ambulatory Visit: Payer: Self-pay

## 2019-12-17 ENCOUNTER — Ambulatory Visit (INDEPENDENT_AMBULATORY_CARE_PROVIDER_SITE_OTHER): Payer: BC Managed Care – PPO | Admitting: Family Medicine

## 2019-12-17 ENCOUNTER — Encounter: Payer: Self-pay | Admitting: Nurse Practitioner

## 2019-12-17 ENCOUNTER — Encounter: Payer: Self-pay | Admitting: Family Medicine

## 2019-12-17 VITALS — BP 120/90 | HR 80 | Temp 97.2°F | Ht 64.0 in | Wt 260.0 lb

## 2019-12-17 DIAGNOSIS — F419 Anxiety disorder, unspecified: Secondary | ICD-10-CM | POA: Diagnosis not present

## 2019-12-17 DIAGNOSIS — K219 Gastro-esophageal reflux disease without esophagitis: Secondary | ICD-10-CM

## 2019-12-17 DIAGNOSIS — R110 Nausea: Secondary | ICD-10-CM

## 2019-12-17 DIAGNOSIS — R1013 Epigastric pain: Secondary | ICD-10-CM | POA: Diagnosis not present

## 2019-12-17 DIAGNOSIS — G4700 Insomnia, unspecified: Secondary | ICD-10-CM

## 2019-12-17 MED ORDER — VENLAFAXINE HCL ER 75 MG PO CP24: 75.0000 mg | ORAL_CAPSULE | Freq: Every day | ORAL | 3 refills | Status: DC

## 2019-12-17 MED ORDER — TRAZODONE HCL 100 MG PO TABS: 100.0000 mg | ORAL_TABLET | Freq: Every day | ORAL | 3 refills | Status: DC

## 2019-12-17 NOTE — Progress Notes (Signed)
I agree with the above note, plan 

## 2019-12-17 NOTE — Patient Instructions (Addendum)
If you are age 32 or older, your body mass index should be between 23-30. Your Body mass index is 44.63 kg/m. If this is out of the aforementioned range listed, please consider follow up with your Primary Care Provider.  If you are age 4 or younger, your body mass index should be between 19-25. Your Body mass index is 44.63 kg/m. If this is out of the aformentioned range listed, please consider follow up with your Primary Care Provider.   DECREASE Protonix to every morning for two weeks, then every other day for two weeks, then every other day for one week then stop.  Take Pepcid every other day for two weeks, then every other day for one week, then stop.  You have been scheduled for an endoscopy. Please follow written instructions given to you at your visit today. If you use inhalers (even only as needed), please bring them with you on the day of your procedure. Your physician has requested that you go to www.startemmi.com and enter the access code given to you at your visit today. This web site gives a general overview about your procedure. However, you should still follow specific instructions given to you by our office regarding your preparation for the procedure.   Follow up with Dr. Ardis Hughs after testing.  Thank you for choosing me and [facility].   Tye Savoy, NP  Due to recent COVID-19 restrictions implemented by our local and state authorities and in an effort to keep both patients and staff as safe as possible, our hospital system now requires COVID-19 testing prior to any scheduled hospital procedure. Please go to our [facility] location drive thru testing site ([address] [place], [place] [address]) on 01/19/20 at  9:50am. There will be multiple testing areas, the first checkpoint being for pre-procedure/surgery testing. Get into the right (yellow) lane that leads to the PAT testing team. You will not be billed at the time of testing but may receive a  bill later depending on your insurance. The approximate cost of the test is $100. You must agree to quarantine from the time of your testing until the procedure date on 01/22/20 . This should include staying at home with ONLY the people you live with. Avoid take-out, grocery store shopping or leaving the house for any non-emergent reason. Failure to have your COVID-19 test done on the date and time you have been scheduled will result in cancellation of procedure. Please call our office at [phone] if you have any questions.

## 2019-12-17 NOTE — Progress Notes (Signed)
IMPRESSION and PLAN:    # Chronic upper abdominal / chest burning / post-prandial nausea (new within last couple of months) -Normal EGD and symptoms refractory to high dose acid suppression and carafate so query if symptoms truly acid related  -Wean off PPI / H2 blockers over next few weeks then proceed with Bravo pH study.  -Declined anti-emetic  # Anxiety -Telemedicine visit with PCP 11/27/19 for evaluation of panic attacks.  Started on Venlafaxine and takes Alprazolam as needed.   HPI:    Primary GI: Dr. Ardis Hughs,   Chief complaint : persistent heartburn, upper abdominal burning, new nausea  Krystal Harris established care with [place] in early December for evaluation of GERD and chronic epigastric pain.   Previous to establishing care with [place] she was followed by Dr. Collene Mares but was unable to afford an EGD then.  She came to [place] symptomatic on high-dose PPI plus 4 times daily Carafate.  She had a normal EGD by Dr. Ardis Hughs 09/23/2019.  Seen in the ED a few days later on 09/29/2019 for ongoing epigastric pain.  Labs were unremarkable.  No acute findings on CT scan of the abdomen and pelvis.  Chest x-ray negative.   Krystal Harris is back with persistent epigastric and chest burning.  She is now having postprandial nausea without vomiting. She frequently  Belches ( with some associated regurgitation) even on empty stomach. The burning is present even in the absence of eating though postprandial state exacerbates the symptoms.  She is currently taking twice daily Protonix plus Pepcid at bedtime.  Not taking any NSAIDs.  Oral iron on med list but not taking it - causes abdominal pain. She had a cholecystectomy in 2016 and says all of her symptoms have returned. Weight is stable.    Data Reviewed:   10/03/19  Liver tests normal.  Troponin normal CBC normal.   09/29/19 CT w/ contrast for abdominal pain -no acute abnormalities or explanation for pain  09/23/2019 EGD for GERD and epigastric  pain -Esophagus, stomach and duodenum all normal.  Review of systems:   no SOB, no fevers, no urinary sx   Past Medical History:  Diagnosis Date  . Anxiety   . Chicken pox   . Depression   . Gallbladder problem   . GERD (gastroesophageal reflux disease)   . PONV (postoperative nausea and vomiting)    vomiting after surgery gallbladder removal 2016  . Vitamin D deficiency     Patient's surgical history, family medical history, social history, medications and allergies were all reviewed in Epic   Creatinine clearance cannot be calculated (Patient's most recent lab result is older than the maximum 21 days allowed.)  Current Outpatient Medications  Medication Sig Dispense Refill  . ALPRAZolam (XANAX) 0.5 MG tablet Take 1 tablet (0.5 mg total) by mouth 2 (two) times daily as needed for anxiety. 30 tablet 1  . famotidine (PEPCID) 40 MG tablet Take 1 tablet (40 mg total) by mouth 2 (two) times daily. (Patient taking differently: Take 40 mg by mouth at bedtime. ) 60 tablet 3  . pantoprazole (PROTONIX) 40 MG tablet TAKE 1 TABLET BY MOUTH TWICE A DAY 60 tablet 2  . traZODone (DESYREL) 50 MG tablet TAKE 1/2 TO 1 TABLETS (25-50 MG TOTAL) BY MOUTH AT BEDTIME AS NEEDED FOR SLEEP. 30 tablet 2  . venlafaxine XR (EFFEXOR XR) 37.5 MG 24 hr capsule Take 1 capsule (37.5 mg total) by mouth daily with breakfast. 30 capsule  3   No current facility-administered medications for this visit.    Physical Exam:     BP 120/90   Pulse 80   Temp (!) 97.2 F (36.2 C)   Ht 5\' 4"  (1.626 m)   Wt 260 lb (117.9 kg)   LMP 12/01/2019 (Approximate)   BMI 44.63 kg/m   GENERAL:  Pleasant female in NAD PSYCH: : Cooperative, normal affect CARDIAC:  RRR, no murmur heard, no peripheral edema PULM: Normal respiratory effort, lungs CTA bilaterally, no wheezing ABDOMEN:  Nondistended, soft, nontender. No obvious masses, no hepatomegaly,  normal bowel sounds SKIN:  turgor, no lesions seen Musculoskeletal:  Normal  muscle tone, normal strength NEURO: Alert and oriented x 3, no focal neurologic deficits   Tye Savoy , NP 12/17/2019, 9:47 AM

## 2019-12-17 NOTE — Progress Notes (Signed)
I have discussed the procedure for the virtual visit with the patient who has given consent to proceed with assessment and treatment.   Dennis Hegeman L Porshe Fleagle, CMA     

## 2019-12-17 NOTE — Progress Notes (Signed)
Virtual Visit via Video   I connected with patient on 12/17/19 at  1:00 PM EST by a video enabled telemedicine application and verified that I am speaking with the correct person using two identifiers.  Location patient: Home Location provider: Acupuncturist, Office Persons participating in the virtual visit: Patient, Provider, [place] (Jess B)  I discussed the limitations of evaluation and management by telemedicine and the availability of in person appointments. The patient expressed understanding and agreed to proceed.  Subjective:   HPI:   Anxiety- pt was started on Venlafaxine last visit.  She states she is no longer having 'fears at night' but continues to have some difficulty sleeping through the night.  Will take Trazodone at 6 but is still awake at 11pm.  Pt feels day time anxiety is better- has only required rescue medicine x2.  Pt feels that increasing the medication may be helpful.  ROS:   See pertinent positives and negatives per HPI.  Patient Active Problem List   Diagnosis Date Noted  . GERD (gastroesophageal reflux disease) 05/01/2019  . Knee osteoarthritis 09/12/2017  . Anxiety 04/11/2017  . Insomnia 04/11/2017  . Fatty liver 09/27/2016    Social History   Tobacco Use  . Smoking status: Never Smoker  . Smokeless tobacco: Never Used  Substance Use Topics  . Alcohol use: Yes    Comment: occasionally    Current Outpatient Medications:  .  ALPRAZolam (XANAX) 0.5 MG tablet, Take 1 tablet (0.5 mg total) by mouth 2 (two) times daily as needed for anxiety., Disp: 30 tablet, Rfl: 1 .  famotidine (PEPCID) 40 MG tablet, Take 1 tablet (40 mg total) by mouth 2 (two) times daily. (Patient taking differently: Take 40 mg by mouth at bedtime. ), Disp: 60 tablet, Rfl: 3 .  pantoprazole (PROTONIX) 40 MG tablet, TAKE 1 TABLET BY MOUTH TWICE A DAY, Disp: 60 tablet, Rfl: 2 .  traZODone (DESYREL) 50 MG tablet, TAKE 1/2 TO 1 TABLETS (25-50 MG TOTAL) BY MOUTH AT BEDTIME AS  NEEDED FOR SLEEP., Disp: 30 tablet, Rfl: 2 .  venlafaxine XR (EFFEXOR XR) 37.5 MG 24 hr capsule, Take 1 capsule (37.5 mg total) by mouth daily with breakfast., Disp: 30 capsule, Rfl: 3  Allergies  Allergen Reactions  . Penicillins Hives and Nausea And Vomiting    Has patient had a PCN reaction causing immediate rash, facial/tongue/throat swelling, SOB or lightheadedness with hypotension: no Has patient had a PCN reaction causing severe rash involving mucus membranes or skin necrosis: unknown Has patient had a PCN reaction that required hospitalization unknown Has patient had a PCN reaction occurring within the last 10 years: unknown If all of the above answers are "NO", then may proceed with Cephalosporin use.     Objective:   LMP 12/01/2019 (Approximate)   AAOx3, NAD NCAT, EOMI No obvious CN deficits Coloring WNL Pt is able to speak clearly, coherently without shortness of breath or increased work of breathing.  Thought process is linear.  Mood is appropriate.   Assessment and Plan:   Anxiety- improved since starting Effexor at last visit.  Feels that sxs will improve further w/ increased dose.  Will increase to 75mg  daily and monitor for continued improvement.  Insomnia- pt continues to struggle w/ sleep.  Both falling asleep and staying asleep are difficult.  Increase Trazodone to 100mg  nightly while working on anxiety and monitor for improvement.  Pt expressed understanding and is in agreement w/ plan.    [name], MD 12/17/2019

## 2020-01-19 ENCOUNTER — Telehealth (INDEPENDENT_AMBULATORY_CARE_PROVIDER_SITE_OTHER): Payer: BC Managed Care – PPO | Admitting: Family Medicine

## 2020-01-19 ENCOUNTER — Other Ambulatory Visit: Payer: Self-pay

## 2020-01-19 ENCOUNTER — Encounter: Payer: Self-pay | Admitting: Family Medicine

## 2020-01-19 ENCOUNTER — Inpatient Hospital Stay (HOSPITAL_COMMUNITY): Admission: RE | Admit: 2020-01-19 | Payer: BC Managed Care – PPO | Source: Ambulatory Visit

## 2020-01-19 ENCOUNTER — Other Ambulatory Visit (HOSPITAL_COMMUNITY): Payer: BC Managed Care – PPO

## 2020-01-19 ENCOUNTER — Other Ambulatory Visit: Payer: Self-pay | Admitting: Gastroenterology

## 2020-01-19 VITALS — Temp 98.2°F | Ht 64.0 in | Wt 260.0 lb

## 2020-01-19 DIAGNOSIS — G4700 Insomnia, unspecified: Secondary | ICD-10-CM | POA: Diagnosis not present

## 2020-01-19 DIAGNOSIS — F419 Anxiety disorder, unspecified: Secondary | ICD-10-CM | POA: Diagnosis not present

## 2020-01-19 LAB — SARS CORONAVIRUS 2 (TAT 6-24 HRS): SARS Coronavirus 2: NEGATIVE

## 2020-01-19 NOTE — Progress Notes (Signed)
Virtual Visit via Video   I connected with patient on 01/19/20 at 11:30 AM EDT by a video enabled telemedicine application and verified that I am speaking with the correct person using two identifiers.  Location patient: Home Location provider: Acupuncturist, Office Persons participating in the virtual visit: Patient, Provider, [place] (Jess B)  I discussed the limitations of evaluation and management by telemedicine and the availability of in person appointments. The patient expressed understanding and agreed to proceed.  Subjective:   HPI:   Anxiety- pt reports mood is much improved since increasing the Effexor to 75mg  daily.  Also sleeping better since increasing Trazodone to 100mg  QHS.  Feels 'much better'.  Does need to switch therapists and would prefer to work with a female this time.  ROS:   See pertinent positives and negatives per HPI.  Patient Active Problem List   Diagnosis Date Noted  . GERD (gastroesophageal reflux disease) 05/01/2019  . Knee osteoarthritis 09/12/2017  . Anxiety 04/11/2017  . Insomnia 04/11/2017  . Fatty liver 09/27/2016    Social History   Tobacco Use  . Smoking status: Never Smoker  . Smokeless tobacco: Never Used  Substance Use Topics  . Alcohol use: Yes    Comment: occasionally    Current Outpatient Medications:  .  acetaminophen (TYLENOL) 500 MG tablet, Take 1,000 mg by mouth every 6 (six) hours as needed for mild pain or headache., Disp: , Rfl:  .  ALPRAZolam (XANAX) 0.5 MG tablet, Take 1 tablet (0.5 mg total) by mouth 2 (two) times daily as needed for anxiety., Disp: 30 tablet, Rfl: 1 .  Ascorbic Acid (VITAMIN C) 1000 MG tablet, Take 1,000 mg by mouth daily., Disp: , Rfl:  .  Cholecalciferol (VITAMIN D3) 125 MCG (5000 UT) CAPS, Take 5,000 Units by mouth daily at 12 noon., Disp: , Rfl:  .  famotidine (PEPCID) 40 MG tablet, Take 1 tablet (40 mg total) by mouth 2 (two) times daily. (Patient taking differently: Take 40 mg by mouth at  bedtime. ), Disp: 60 tablet, Rfl: 3 .  pantoprazole (PROTONIX) 40 MG tablet, TAKE 1 TABLET BY MOUTH TWICE A DAY (Patient taking differently: Take 40 mg by mouth 2 (two) times daily. ), Disp: 60 tablet, Rfl: 2 .  traZODone (DESYREL) 100 MG tablet, Take 1 tablet (100 mg total) by mouth at bedtime., Disp: 30 tablet, Rfl: 3 .  venlafaxine XR (EFFEXOR XR) 75 MG 24 hr capsule, Take 1 capsule (75 mg total) by mouth daily with breakfast., Disp: 30 capsule, Rfl: 3  Allergies  Allergen Reactions  . Penicillins Hives and Nausea And Vomiting    Has patient had a PCN reaction causing immediate rash, facial/tongue/throat swelling, SOB or lightheadedness with hypotension: no Has patient had a PCN reaction causing severe rash involving mucus membranes or skin necrosis: unknown Has patient had a PCN reaction that required hospitalization unknown Has patient had a PCN reaction occurring within the last 10 years: unknown If all of the above answers are "NO", then may proceed with Cephalosporin use.     Objective:   Temp 98.2 F (36.8 C) (Oral)   Ht 5\' 4"  (1.626 m)   Wt 260 lb (117.9 kg)   BMI 44.63 kg/m  AAOx3, NAD NCAT, EOMI No obvious CN deficits Coloring WNL Pt is able to speak clearly, coherently without shortness of breath or increased work of breathing.  Thought process is linear.  Mood is appropriate.   Assessment and Plan:   Anxiety- much improved  since increasing Effexor to 75mg  daily.  No med changes at this time.  She does need a referral to a new therapist- prefers a female at this time.  Referral placed.  Insomnia- improved since increasing Trazodone to 100mg  nightly.  No med changes at this time.   [name], MD 01/19/2020

## 2020-01-19 NOTE — Progress Notes (Signed)
I have discussed the procedure for the virtual visit with the patient who has given consent to proceed with assessment and treatment.   Krystal Harris L Uday Jantz, CMA     

## 2020-01-19 NOTE — H&P (View-Only) (Signed)
Virtual Visit via Video   I connected with patient on 01/19/20 at 11:30 AM EDT by a video enabled telemedicine application and verified that I am speaking with the correct person using two identifiers.  Location patient: Home Location provider: Acupuncturist, Office Persons participating in the virtual visit: Patient, Provider, [place] (Jess B)  I discussed the limitations of evaluation and management by telemedicine and the availability of in person appointments. The patient expressed understanding and agreed to proceed.  Subjective:   HPI:   Anxiety- pt reports mood is much improved since increasing the Effexor to 75mg  daily.  Also sleeping better since increasing Trazodone to 100mg  QHS.  Feels 'much better'.  Does need to switch therapists and would prefer to work with a female this time.  ROS:   See pertinent positives and negatives per HPI.  Patient Active Problem List   Diagnosis Date Noted  . GERD (gastroesophageal reflux disease) 05/01/2019  . Knee osteoarthritis 09/12/2017  . Anxiety 04/11/2017  . Insomnia 04/11/2017  . Fatty liver 09/27/2016    Social History   Tobacco Use  . Smoking status: Never Smoker  . Smokeless tobacco: Never Used  Substance Use Topics  . Alcohol use: Yes    Comment: occasionally    Current Outpatient Medications:  .  acetaminophen (TYLENOL) 500 MG tablet, Take 1,000 mg by mouth every 6 (six) hours as needed for mild pain or headache., Disp: , Rfl:  .  ALPRAZolam (XANAX) 0.5 MG tablet, Take 1 tablet (0.5 mg total) by mouth 2 (two) times daily as needed for anxiety., Disp: 30 tablet, Rfl: 1 .  Ascorbic Acid (VITAMIN C) 1000 MG tablet, Take 1,000 mg by mouth daily., Disp: , Rfl:  .  Cholecalciferol (VITAMIN D3) 125 MCG (5000 UT) CAPS, Take 5,000 Units by mouth daily at 12 noon., Disp: , Rfl:  .  famotidine (PEPCID) 40 MG tablet, Take 1 tablet (40 mg total) by mouth 2 (two) times daily. (Patient taking differently: Take 40 mg by mouth at  bedtime. ), Disp: 60 tablet, Rfl: 3 .  pantoprazole (PROTONIX) 40 MG tablet, TAKE 1 TABLET BY MOUTH TWICE A DAY (Patient taking differently: Take 40 mg by mouth 2 (two) times daily. ), Disp: 60 tablet, Rfl: 2 .  traZODone (DESYREL) 100 MG tablet, Take 1 tablet (100 mg total) by mouth at bedtime., Disp: 30 tablet, Rfl: 3 .  venlafaxine XR (EFFEXOR XR) 75 MG 24 hr capsule, Take 1 capsule (75 mg total) by mouth daily with breakfast., Disp: 30 capsule, Rfl: 3  Allergies  Allergen Reactions  . Penicillins Hives and Nausea And Vomiting    Has patient had a PCN reaction causing immediate rash, facial/tongue/throat swelling, SOB or lightheadedness with hypotension: no Has patient had a PCN reaction causing severe rash involving mucus membranes or skin necrosis: unknown Has patient had a PCN reaction that required hospitalization unknown Has patient had a PCN reaction occurring within the last 10 years: unknown If all of the above answers are "NO", then may proceed with Cephalosporin use.     Objective:   Temp 98.2 F (36.8 C) (Oral)   Ht 5\' 4"  (1.626 m)   Wt 260 lb (117.9 kg)   BMI 44.63 kg/m  AAOx3, NAD NCAT, EOMI No obvious CN deficits Coloring WNL Pt is able to speak clearly, coherently without shortness of breath or increased work of breathing.  Thought process is linear.  Mood is appropriate.   Assessment and Plan:   Anxiety- much improved  since increasing Effexor to 75mg  daily.  No med changes at this time.  She does need a referral to a new therapist- prefers a female at this time.  Referral placed.  Insomnia- improved since increasing Trazodone to 100mg  nightly.  No med changes at this time.   [name], MD 01/19/2020

## 2020-01-21 NOTE — Progress Notes (Signed)
Spoke with patient re: am appt with Dr Ardis Hughs. Been in quarantine since test; no symptoms. Aware of time to be at facility, all questions answered.

## 2020-01-22 ENCOUNTER — Encounter (HOSPITAL_COMMUNITY): Payer: Self-pay | Admitting: Gastroenterology

## 2020-01-22 ENCOUNTER — Other Ambulatory Visit: Payer: Self-pay

## 2020-01-22 ENCOUNTER — Ambulatory Visit (HOSPITAL_COMMUNITY)
Admission: RE | Admit: 2020-01-22 | Discharge: 2020-01-22 | Disposition: A | Discharge status: Home / Self Care | Payer: BC Managed Care – PPO | Attending: Gastroenterology | Admitting: Gastroenterology

## 2020-01-22 ENCOUNTER — Ambulatory Visit (HOSPITAL_COMMUNITY): Payer: BC Managed Care – PPO | Admitting: Registered Nurse

## 2020-01-22 ENCOUNTER — Encounter (HOSPITAL_COMMUNITY)
Admission: RE | Disposition: A | Discharge status: Home / Self Care | Payer: Self-pay | Source: Home / Self Care | Attending: Gastroenterology

## 2020-01-22 DIAGNOSIS — Z6841 Body Mass Index (BMI) 40.0 and over, adult: Secondary | ICD-10-CM | POA: Diagnosis not present

## 2020-01-22 DIAGNOSIS — Z79899 Other long term (current) drug therapy: Secondary | ICD-10-CM | POA: Diagnosis not present

## 2020-01-22 DIAGNOSIS — R12 Heartburn: Secondary | ICD-10-CM

## 2020-01-22 DIAGNOSIS — K219 Gastro-esophageal reflux disease without esophagitis: Secondary | ICD-10-CM | POA: Insufficient documentation

## 2020-01-22 DIAGNOSIS — G4700 Insomnia, unspecified: Secondary | ICD-10-CM | POA: Diagnosis not present

## 2020-01-22 DIAGNOSIS — F419 Anxiety disorder, unspecified: Secondary | ICD-10-CM | POA: Diagnosis not present

## 2020-01-22 HISTORY — PX: ESOPHAGOGASTRODUODENOSCOPY (EGD) WITH PROPOFOL: SHX5813

## 2020-01-22 HISTORY — PX: BRAVO PH STUDY: SHX5421

## 2020-01-22 LAB — PREGNANCY, URINE: Preg Test, Ur: NEGATIVE

## 2020-01-22 SURGERY — ESOPHAGOGASTRODUODENOSCOPY (EGD) WITH PROPOFOL
Anesthesia: Monitor Anesthesia Care

## 2020-01-22 MED ORDER — SODIUM CHLORIDE 0.9 % IV SOLN: INTRAVENOUS | Status: DC

## 2020-01-22 MED ORDER — PROPOFOL 10 MG/ML IV BOLUS
INTRAVENOUS | Status: DC | PRN
  Administered 2020-01-22: 20 mg via INTRAVENOUS
  Administered 2020-01-22 (×2): 30 mg via INTRAVENOUS
  Administered 2020-01-22: 50 mg via INTRAVENOUS
  Administered 2020-01-22 (×2): 30 mg via INTRAVENOUS

## 2020-01-22 MED ORDER — LIDOCAINE HCL (CARDIAC) PF 100 MG/5ML IV SOSY
PREFILLED_SYRINGE | INTRAVENOUS | Status: DC | PRN
  Administered 2020-01-22: 100 mg via INTRAVENOUS

## 2020-01-22 MED ORDER — DEXAMETHASONE SODIUM PHOSPHATE 10 MG/ML IJ SOLN
INTRAMUSCULAR | Status: DC | PRN
  Administered 2020-01-22: 10 mg via INTRAVENOUS

## 2020-01-22 MED ORDER — ONDANSETRON HCL 4 MG/2ML IJ SOLN
INTRAMUSCULAR | Status: DC | PRN
  Administered 2020-01-22: 4 mg via INTRAVENOUS

## 2020-01-22 MED ORDER — GLYCOPYRROLATE 0.2 MG/ML IJ SOLN
INTRAMUSCULAR | Status: DC | PRN
  Administered 2020-01-22: .1 mg via INTRAVENOUS

## 2020-01-22 MED ORDER — PROPOFOL 500 MG/50ML IV EMUL
INTRAVENOUS | Status: AC
  Filled 2020-01-22: qty 50

## 2020-01-22 MED ORDER — PHENYLEPHRINE 40 MCG/ML (10ML) SYRINGE FOR IV PUSH (FOR BLOOD PRESSURE SUPPORT)
PREFILLED_SYRINGE | INTRAVENOUS | Status: DC | PRN
  Administered 2020-01-22: 120 ug via INTRAVENOUS

## 2020-01-22 MED ORDER — EPHEDRINE SULFATE-NACL 50-0.9 MG/10ML-% IV SOSY
PREFILLED_SYRINGE | INTRAVENOUS | Status: DC | PRN
  Administered 2020-01-22 (×2): 5 mg via INTRAVENOUS
  Administered 2020-01-22: 10 mg via INTRAVENOUS

## 2020-01-22 MED ORDER — LACTATED RINGERS IV SOLN
INTRAVENOUS | Status: DC
  Administered 2020-01-22: 1000 mL via INTRAVENOUS

## 2020-01-22 SURGICAL SUPPLY — 14 items

## 2020-01-22 NOTE — Op Note (Addendum)
[facility] Patient Name: Krystal Harris Procedure Date: 01/22/2020 MRN: [id number] Attending MD: Milus Banister , MD Date of Birth: September 14, 1986 CSN: [id number] Age: 34 Admit Type: Outpatient Procedure:                Upper GI endoscopy Indications:              Heartburn unresponsive to PPI BID, H2 blocker QHS.                            This pH test is being done OFF antiacid medicines. Providers:                Milus Banister, MD, Carlyn Reichert, RN, Cherylynn Ridges, Technician, Enrigue Catena, CRNA Referring MD:              Medicines:                Monitored Anesthesia Care Complications:            No immediate complications. Estimated blood loss:                            None. Estimated Blood Loss:     Estimated blood loss: none. Procedure:                Pre-Anesthesia Assessment:                           - Prior to the procedure, a History and Physical                            was performed, and patient medications and                            allergies were reviewed. The patient's tolerance of                            previous anesthesia was also reviewed. The risks                            and benefits of the procedure and the sedation                            options and risks were discussed with the patient.                            All questions were answered, and informed consent                            was obtained. Prior Anticoagulants: The patient has                            taken no previous anticoagulant or antiplatelet  agents. ASA Grade Assessment: II - A patient with                            mild systemic disease. After reviewing the risks                            and benefits, the patient was deemed in                            satisfactory condition to undergo the procedure.                           After obtaining informed consent, the endoscope was         passed under direct vision. Throughout the                            procedure, the patient's blood pressure, pulse, and                            oxygen saturations were monitored continuously. The                            [id number] Olympus gastroscope was                            introduced through the mouth, and advanced to the                            second part of duodenum. The upper GI endoscopy was                            accomplished without difficulty. The patient                            tolerated the procedure well. Scope In: Scope Out: Findings:      The esophagus was normal with GE junction at 41cm from the bite block.       An esophageal pH Bravo was placed at 35 cm from the bite block (6cm       proximal to the GE junction).      The stomach was normal.      The examined duodenum was normal. Impression:               - The esophagus was normal with GE junction at 41cm                            from the bite block. An esophageal pH Bravo was                            placed at 35 cm from the bite block (6cm proximal                            to the GE junction).                           -  The examination was otherwise normal. Moderate Sedation:      Not Applicable - Patient had care per Anesthesia. Recommendation:           - Patient has a contact number available for                            emergencies. The signs and symptoms of potential                            delayed complications were discussed with the                            patient. Return to normal activities tomorrow.                            Written discharge instructions were provided to the                            patient.                           - Resume previous diet.                           - Continue present medications.                           - This pH test is being done OFF antiacid medicines                            (she stopped them all 7 days  ago) Procedure Code(s):        --- Professional ---                           [phone], Esophagogastroduodenoscopy, flexible,                            transoral; diagnostic, including collection of                            specimen(s) by brushing or washing, when performed                            (separate procedure) Diagnosis Code(s):        --- Professional ---                           R12, Heartburn CPT copyright 2019 [employer]. All rights reserved. The codes documented in this report are preliminary and upon coder review may  be revised to meet current compliance requirements. Milus Banister, MD 01/22/2020 8:57:08 AM This report has been signed electronically. Number of Addenda: 0

## 2020-01-22 NOTE — Discharge Instructions (Signed)
YOU HAD AN ENDOSCOPIC PROCEDURE TODAY: Refer to the procedure report and other information in the discharge instructions given to you for any specific questions about what was found during the examination. If this information does not answer your questions, please call [place] office at [phone] to clarify.   YOU SHOULD EXPECT: Some feelings of bloating in the abdomen. Passage of more gas than usual. Walking can help get rid of the air that was put into your GI tract during the procedure and reduce the bloating. If you had a lower endoscopy (such as a colonoscopy or flexible sigmoidoscopy) you may notice spotting of blood in your stool or on the toilet paper. Some abdominal soreness may be present for a day or two, also.  DIET: Your first meal following the procedure should be a light meal and then it is ok to progress to your normal diet. A half-sandwich or bowl of soup is an example of a good first meal. Heavy or fried foods are harder to digest and may make you feel nauseous or bloated. Drink plenty of fluids but you should avoid alcoholic beverages for 24 hours. If you had a esophageal dilation, please see attached instructions for diet.    ACTIVITY: Your care partner should take you home directly after the procedure. You should plan to take it easy, moving slowly for the rest of the day. You can resume normal activity the day after the procedure however YOU SHOULD NOT DRIVE, use power tools, machinery or perform tasks that involve climbing or major physical exertion for 24 hours (because of the sedation medicines used during the test).   SYMPTOMS TO REPORT IMMEDIATELY: A gastroenterologist can be reached at any hour. Please call [phone]  for any of the following symptoms:   Following upper endoscopy (EGD, EUS, ERCP, esophageal dilation) Vomiting of blood or coffee ground material  New, significant abdominal pain  New, significant chest pain or pain under the shoulder blades  Painful or  persistently difficult swallowing  New shortness of breath  Black, tarry-looking or red, bloody stools  FOLLOW UP:  If any biopsies were taken you will be contacted by phone or by letter within the next 1-3 weeks. Call [phone]  if you have not heard about the biopsies in 3 weeks.  Please also call with any specific questions about appointments or follow up tests.  

## 2020-01-22 NOTE — Anesthesia Preprocedure Evaluation (Signed)
Anesthesia Evaluation  Patient identified by MRN, date of birth, ID band Patient awake    Reviewed: Allergy & Precautions, NPO status , Patient's Chart, lab work & pertinent test results  Airway Mallampati: II  TM Distance: >3 FB Neck ROM: Full    Dental no notable dental hx.    Pulmonary neg pulmonary ROS,    Pulmonary exam normal breath sounds clear to auscultation       Cardiovascular negative cardio ROS Normal cardiovascular exam Rhythm:Regular Rate:Normal     Neuro/Psych Anxiety negative neurological ROS     GI/Hepatic Neg liver ROS, GERD  Medicated,  Endo/Other  Morbid obesity  Renal/GU negative Renal ROS  negative genitourinary   Musculoskeletal negative musculoskeletal ROS (+)   Abdominal (+) + obese,   Peds negative pediatric ROS (+)  Hematology negative hematology ROS (+)   Anesthesia Other Findings   Reproductive/Obstetrics negative OB ROS                             Anesthesia Physical Anesthesia Plan  ASA: III  Anesthesia Plan: MAC   Post-op Pain Management:    Induction: Intravenous  PONV Risk Score and Plan: 0  Airway Management Planned: Simple Face Mask  Additional Equipment:   Intra-op Plan:   Post-operative Plan:   Informed Consent: I have reviewed the patients History and Physical, chart, labs and discussed the procedure including the risks, benefits and alternatives for the proposed anesthesia with the patient or authorized representative who has indicated his/her understanding and acceptance.     Dental advisory given  Plan Discussed with: CRNA and Surgeon  Anesthesia Plan Comments:         Anesthesia Quick Evaluation

## 2020-01-22 NOTE — Interval H&P Note (Signed)
History and Physical Interval Note:  01/22/2020 7:40 AM  Krystal Harris  has presented today for surgery, with the diagnosis of gerd.  The various methods of treatment have been discussed with the patient and family. After consideration of risks, benefits and other options for treatment, the patient has consented to  Procedure(s): BRAVO PH STUDY (N/A) ESOPHAGOGASTRODUODENOSCOPY (EGD) WITH PROPOFOL (N/A) as a surgical intervention.  The patient's history has been reviewed, patient examined, no change in status, stable for surgery.  I have reviewed the patient's chart and labs.  Questions were answered to the patient's satisfaction.     Milus Banister

## 2020-01-22 NOTE — Anesthesia Postprocedure Evaluation (Signed)
Anesthesia Post Note  Patient: Krystal Harris  Procedure(s) Performed: ESOPHAGOGASTRODUODENOSCOPY (EGD) WITH PROPOFOL (N/A ) BRAVO PH STUDY (N/A )     Patient location during evaluation: PACU Anesthesia Type: MAC Level of consciousness: awake and alert Pain management: pain level controlled Vital Signs Assessment: post-procedure vital signs reviewed and stable Respiratory status: spontaneous breathing, nonlabored ventilation, respiratory function stable and patient connected to nasal cannula oxygen Cardiovascular status: stable and blood pressure returned to baseline Postop Assessment: no apparent nausea or vomiting Anesthetic complications: no    Last Vitals:  Vitals:   01/22/20 0859 01/22/20 0900  BP: (!) 147/71 (!) 147/71  Pulse: 87 83  Resp: 16 18  Temp: 36.6 C   SpO2: 100% 100%    Last Pain:  Vitals:   01/22/20 0900  TempSrc:   PainSc: 0-No pain                 Janelle Culton S

## 2020-01-22 NOTE — Anesthesia Procedure Notes (Signed)
Procedure Name: MAC Date/Time: 01/22/2020 8:30 AM Performed by: Lissa Morales, CRNA Pre-anesthesia Checklist: Patient identified, Emergency Drugs available, Suction available, Patient being monitored and Timeout performed Patient Re-evaluated:Patient Re-evaluated prior to induction Oxygen Delivery Method: Simple face mask Placement Confirmation: positive ETCO2 and breath sounds checked- equal and bilateral Dental Injury: Teeth and Oropharynx as per pre-operative assessment

## 2020-01-22 NOTE — Transfer of Care (Signed)
Immediate Anesthesia Transfer of Care Note  Patient: Krystal Harris  Procedure(s) Performed: ESOPHAGOGASTRODUODENOSCOPY (EGD) WITH PROPOFOL (N/A ) BRAVO PH STUDY (N/A )  Patient Location: PACU and Endoscopy Unit  Anesthesia Type:MAC  Level of Consciousness: awake  Airway & Oxygen Therapy: Patient Spontanous Breathing and Patient connected to nasal cannula oxygen  Post-op Assessment: Report given to RN and Post -op Vital signs reviewed and stable  Post vital signs: Reviewed and stable  Last Vitals:  Vitals Value Taken Time  BP    Temp    Pulse 85 01/22/20 0859  Resp 15 01/22/20 0859  SpO2 100 % 01/22/20 0859  Vitals shown include unvalidated device data.  Last Pain:  Vitals:   01/22/20 0802  TempSrc: Oral  PainSc: 0-No pain         Complications: No apparent anesthesia complications

## 2020-01-29 ENCOUNTER — Ambulatory Visit (INDEPENDENT_AMBULATORY_CARE_PROVIDER_SITE_OTHER): Payer: BC Managed Care – PPO | Admitting: Psychologist

## 2020-01-29 DIAGNOSIS — F410 Panic disorder [episodic paroxysmal anxiety] without agoraphobia: Secondary | ICD-10-CM

## 2020-01-29 DIAGNOSIS — F330 Major depressive disorder, recurrent, mild: Secondary | ICD-10-CM

## 2020-02-06 ENCOUNTER — Telehealth: Payer: Self-pay | Admitting: Gastroenterology

## 2020-02-06 NOTE — Telephone Encounter (Signed)
Dr Ardis Hughs the pt is calling for Bravo test results.  Have you seen the results?

## 2020-02-09 NOTE — Telephone Encounter (Signed)
Hi Dan, I am happy to read it but based on previous discussion you prefer to read them. Please let me know if you want me do it. Thanks

## 2020-02-09 NOTE — Telephone Encounter (Signed)
I spoke with Lattie Haw at [facility] and she faxed it to you this morning.

## 2020-02-09 NOTE — Telephone Encounter (Signed)
Krystal Harris,  You are correct.  I do read my own Bravo test results.  [name], Have you seen the results? I don't think they were ever sent to me.  Thanks

## 2020-02-09 NOTE — Telephone Encounter (Signed)
No I have not see it yet.  Usually it's back by now. Can you call [facility] endo about the pH testing results?  Thanks

## 2020-02-09 NOTE — Telephone Encounter (Signed)
I reviewed the pH test data.  See below.  That text below will be added to the formal report and entered into epic soon.    [name] Please call her, let her know that the test is very good evidence that she indeed has significant gastroesophageal reflux.  I believe that her symptoms are combination of acid related reflux as well as nonacid related reflux.  I actually favor that the nonacid reflux is probably causing most of her symptoms since she had essentially no response to maximum antiacid regimen (twice daily proton pump inhibitor and bedtime H2 blocker).  I would like to refer her to Dr. Bryan Lemma to consider, discuss TIF procedure with her.           -------------------------------------------------------------------- Day 1 DeMeester score 51.4 including a 90-minute supine reflux event. Day 2 DeMeester score 12.4. Cumulative DeMeester score 39.3  This 48-hour wireless pH test was done off antiacid medicines.  Twice daily proton pump inhibitor and same time nightly H2 blocker (previously) did not improve her symptoms.  I think that this wireless pH test is a very good evidence that she has significant reflux.  Her symptoms are probably a combination of acid reflux and nonacid reflux.  Will consider referral for advanced procedures such as TIF. ----------------------------------------------------------------------      Luanna Salk, See above.  I think she would probably be a good candidate for the TIF.  Let me know which you think.  Thanks

## 2020-02-09 NOTE — Telephone Encounter (Signed)
Dr Silverio Decamp have you seen the results for this pt?

## 2020-02-09 NOTE — Telephone Encounter (Signed)
Certainly looks like she has significant reflux based on that Bravo. Unfortunately, her BMI (44) would be prohibitive of TIF (needs to be <35 based on current guidelines), and may instead benefit from referral to Dr. Redmond Pulling or Lucia Gaskins at [facility] for consideration of bariatric surgery as a means to better control reflux, and can possible incorporate partial wrap if needed.   Sorry, wish I could get her in for TIF, as she is otherwise a great candidate for antireflux surgery.   VC

## 2020-02-10 NOTE — Telephone Encounter (Signed)
Vito,  Thanks for looking at this.  [name], Please let her know the above and lets work on referral to [facility] or [facility] for GERD, morbid obesity.  Since acid is at least playing part of the role in her symptoms, I would like her back on once daily PPI (whatever she was on previously), shortly before BF every day.    Thanks

## 2020-02-10 NOTE — Telephone Encounter (Signed)
The pt has been advised and agrees to [facility] referral.  She has seen Dr Rosendo Gros in the past and if possible would like to see him. Records sent to [facility] with that request.

## 2020-02-10 NOTE — Telephone Encounter (Signed)
Left message on machine to call back  

## 2020-02-11 ENCOUNTER — Ambulatory Visit (INDEPENDENT_AMBULATORY_CARE_PROVIDER_SITE_OTHER): Payer: BC Managed Care – PPO | Admitting: Psychologist

## 2020-02-11 DIAGNOSIS — F410 Panic disorder [episodic paroxysmal anxiety] without agoraphobia: Secondary | ICD-10-CM

## 2020-02-11 DIAGNOSIS — F330 Major depressive disorder, recurrent, mild: Secondary | ICD-10-CM | POA: Diagnosis not present

## 2020-03-03 ENCOUNTER — Ambulatory Visit: Payer: BC Managed Care – PPO | Admitting: Psychologist

## 2020-03-12 ENCOUNTER — Encounter: Payer: Self-pay | Admitting: Family Medicine

## 2020-03-12 ENCOUNTER — Other Ambulatory Visit: Payer: Self-pay

## 2020-03-12 ENCOUNTER — Telehealth (INDEPENDENT_AMBULATORY_CARE_PROVIDER_SITE_OTHER): Payer: BC Managed Care – PPO | Admitting: Family Medicine

## 2020-03-12 DIAGNOSIS — K219 Gastro-esophageal reflux disease without esophagitis: Secondary | ICD-10-CM | POA: Diagnosis not present

## 2020-03-12 DIAGNOSIS — R1013 Epigastric pain: Secondary | ICD-10-CM | POA: Diagnosis not present

## 2020-03-12 MED ORDER — SUCRALFATE 1 G PO TABS: 1.0000 g | ORAL_TABLET | Freq: Three times a day (TID) | ORAL | 1 refills | Status: DC

## 2020-03-12 MED ORDER — PANTOPRAZOLE SODIUM 40 MG PO TBEC: 40.0000 mg | DELAYED_RELEASE_TABLET | Freq: Every day | ORAL | 3 refills | Status: DC

## 2020-03-12 NOTE — Progress Notes (Signed)
I have discussed the procedure for the virtual visit with the patient who has given consent to proceed with assessment and treatment.   Pt unable to obtain vitals.   Pt did not reach out to Dr. Ardis Hughs office as she is not happy with their treatment of her symptoms. Pt is not on the pepcid or pantoprazole as Dr. Ardis Hughs told her they were ineffective in her treatment. Has not been on these since her procedure April 8th.   Davis Gourd, CMA

## 2020-03-12 NOTE — Progress Notes (Signed)
Virtual Visit via Video   I connected with patient on 03/12/20 at  4:00 PM EDT by a video enabled telemedicine application and verified that I am speaking with the correct person using two identifiers.  Location patient: Home Location provider: Acupuncturist, Office Persons participating in the virtual visit: Patient, Provider, [name]  I discussed the limitations of evaluation and management by telemedicine and the availability of in person appointments. The patient expressed understanding and agreed to proceed.  Subjective:   HPI:   GI issues- pt has seen GI for acid reflux, but 'my stomach has been hurting a lot more recently.  Like it's a gallbladder attack'.  The other night (Wednesday) abd pain woke her from sleep.  Pain did not improve until she had 2nd BM.  Last night again had severe abdominal pain after eating.  Had to pull over on the way home and vomit.  Pt is very upset at GI office b/c the communication has been poor and taken quite awhile to go back and forth.  They also told her they were referring her for bariatric surgery which pt is opposed to.  Dr Rosendo Gros told pt he could do acid reflux surgery but she needs to lose 30 lbs.  Pt is currently off all of her acid reflux medication.  Pt is very frustrated by the mixed messages and the poor communication.  She feels 'out of the loop'.    ROS:   See pertinent positives and negatives per HPI.  Patient Active Problem List   Diagnosis Date Noted  . GERD (gastroesophageal reflux disease) 05/01/2019  . Knee osteoarthritis 09/12/2017  . Anxiety 04/11/2017  . Insomnia 04/11/2017  . Fatty liver 09/27/2016    Social History   Tobacco Use  . Smoking status: Never Smoker  . Smokeless tobacco: Never Used  Substance Use Topics  . Alcohol use: Yes    Comment: occasionally    Current Outpatient Medications:  .  acetaminophen (TYLENOL) 500 MG tablet, Take 1,000 mg by mouth every 6 (six) hours as needed for mild  pain or headache., Disp: , Rfl:  .  ALPRAZolam (XANAX) 0.5 MG tablet, Take 1 tablet (0.5 mg total) by mouth 2 (two) times daily as needed for anxiety., Disp: 30 tablet, Rfl: 1 .  Ascorbic Acid (VITAMIN C) 1000 MG tablet, Take 1,000 mg by mouth daily., Disp: , Rfl:  .  Cholecalciferol (VITAMIN D3) 125 MCG (5000 UT) CAPS, Take 5,000 Units by mouth daily at 12 noon., Disp: , Rfl:  .  traZODone (DESYREL) 100 MG tablet, Take 1 tablet (100 mg total) by mouth at bedtime., Disp: 30 tablet, Rfl: 3 .  venlafaxine XR (EFFEXOR XR) 75 MG 24 hr capsule, Take 1 capsule (75 mg total) by mouth daily with breakfast., Disp: 30 capsule, Rfl: 3 .  famotidine (PEPCID) 40 MG tablet, Take 1 tablet (40 mg total) by mouth 2 (two) times daily. (Patient not taking: Reported on 03/12/2020), Disp: 60 tablet, Rfl: 3 .  pantoprazole (PROTONIX) 40 MG tablet, TAKE 1 TABLET BY MOUTH TWICE A DAY (Patient not taking: No sig reported), Disp: 60 tablet, Rfl: 2  Allergies  Allergen Reactions  . Penicillins Hives and Nausea And Vomiting    Has patient had a PCN reaction causing immediate rash, facial/tongue/throat swelling, SOB or lightheadedness with hypotension: no Has patient had a PCN reaction causing severe rash involving mucus membranes or skin necrosis: unknown Has patient had a PCN reaction that required hospitalization unknown Has patient  had a PCN reaction occurring within the last 10 years: unknown If all of the above answers are "NO", then may proceed with Cephalosporin use.     Objective:   There were no vitals taken for this visit. AAOx3, NAD NCAT, EOMI No obvious CN deficits Coloring WNL Pt is able to speak clearly, coherently without shortness of breath or increased work of breathing.  Thought process is linear.  Mood is appropriate.   Assessment and Plan:   Epigastric pain/Reflux/Dyspepsia/Vomiting- ongoing issue for pt but recently worsened.  I understand her frustration at feeling as if she has been left  out of the conversation regarding her care.  She was not aware that she was supposed to be on daily PPI tx at this time.  Will restart that and the carafate since she is having increased pain.  Reviewed GI office notes and procedure notes.  Apparently at this time, pt is not a candidate for TIF or Nissen due to BMI.  Will refer to [facility] for consultation/2nd opinion in hopes of getting pt some relief.  Pt expressed understanding and is in agreement w/ plan.    [name], MD 03/12/2020

## 2020-04-27 ENCOUNTER — Ambulatory Visit: Payer: Self-pay | Admitting: General Surgery

## 2020-04-27 NOTE — H&P (Signed)
  History of Present Illness Krystal Harris; 04/27/2020 4:17 PM) The patient is a 34 year old female who presents with a complaint of chronic reflux. Patient is a 34 year old female, comes in secondary to continued reflux. Since her last discussion patient has lost approximately 10 pounds. 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 Patient continues with her reflux continues with her reflux medication.  ---------------------- Patient is a 34 year old female with a history of obesity, chronic reflux over the last year. She states that this is increased significantly. Patient states that she has significant burning in her esophagus as well as her stoma. Patient has typical symptoms to include within of with water brash, sore mouth, coughing. Patient states that she recently underwent pH probe study. Each probe study by Dr. Ardis Hughs did reveal a DeMeester score of 39.3. Patient also underwent endoscopy which I reviewed this was within normal limits. Patient also underwent CT scan in December 2020 secondary to abdominal pain. This did not show any hiatal hernia.    Patient had previous laparoscopic cholecystectomy. Patient's weight today was 269, her BMI was 46   Allergies (Krystal Harris, Harris; 04/27/2020 3:56 PM) Penicillin G Pot in Dextrose *PENICILLINS*  Allergies Reconciled   Medication History (Krystal Harris, Harris; 04/27/2020 3:56 PM) Carafate (Oral) Specific strength unknown - Active. Famotidine (40MG  Tablet, Oral) Active. traZODone HCl (100MG  Tablet, Oral) Active. Venlafaxine HCl ER (75MG  Capsule ER 24HR, Oral) Active. Pantoprazole Sodium (40MG  Tablet DR, Oral) Active. Medications Reconciled    Review of Systems Krystal Ok, Harris; 04/27/2020 4:18 PM) HEENT Present- Wears glasses/contact lenses. Not Present- Earache, Hearing Loss, Hoarseness, Nose Bleed, Oral Ulcers, Ringing in the Ears, Seasonal Allergies, Sinus Pain, Sore Throat,  Visual Disturbances and Yellow Eyes. Psychiatric Present- Anxiety. Not Present- Bipolar, Change in Sleep Pattern, Depression, Fearful and Frequent crying.  Vitals (Krystal Harris; 04/27/2020 3:56 PM) 04/27/2020 3:56 PM Weight: 262.25 lb Height: 64in Body Surface Area: 2.19 m Body Mass Index: 45.01 kg/m  Temp.: 10F  Pulse: 126 (Regular)  BP: 122/72(Sitting, Left Arm, Standard)       Physical Exam Krystal Ok, Harris; 04/27/2020 4:18 PM) The physical exam findings are as follows: Note: Constitutional: No acute distress, conversant, appears stated age  Eyes: Anicteric sclerae, moist conjunctiva, no lid lag  Neck: No thyromegaly, trachea midline, no cervical lymphadenopathy  Lungs: Clear to auscultation biilaterally, normal respiratory effot  Cardiovascular: regular rate & rhythm, no murmurs, no peripheal edema, pedal pulses 2+  GI: Soft, no masses or hepatosplenomegaly, non-tender to palpation  MSK: Normal gait, no clubbing cyanosis, edema  Skin: No rashes, palpation reveals normal skin turgor  Psychiatric: Appropriate judgment and insight, oriented to person, place, and time    Assessment & Plan Krystal Harris; 04/27/2020 4:18 PM) CHRONIC GASTROESOPHAGEAL REFLUX DISEASE (K21.9) Impression: Patient is a 34 year old female who comes in secondary to chronic reflux.  The patient with his straining the right direction. I would be comfortable with proceeding with surgery at this time. Patient would require a robotic toupee versus Nissen fundoplication. I discussed with her the possible consultations surgery to include but not limited to: Infection, bleeding, damages urination structures to include esophagus, intestines, stomach, pneumothorax, and possible recurrence. Patient voiced understanding and wished to proceed.

## 2020-05-01 ENCOUNTER — Other Ambulatory Visit: Payer: Self-pay | Admitting: Family Medicine

## 2020-05-03 ENCOUNTER — Other Ambulatory Visit: Payer: Self-pay | Admitting: Family Medicine

## 2020-05-07 ENCOUNTER — Telehealth (INDEPENDENT_AMBULATORY_CARE_PROVIDER_SITE_OTHER): Payer: BC Managed Care – PPO | Admitting: Family Medicine

## 2020-05-07 ENCOUNTER — Other Ambulatory Visit: Payer: Self-pay

## 2020-05-07 ENCOUNTER — Encounter: Payer: Self-pay | Admitting: Family Medicine

## 2020-05-07 DIAGNOSIS — F419 Anxiety disorder, unspecified: Secondary | ICD-10-CM

## 2020-05-07 MED ORDER — VENLAFAXINE HCL 75 MG PO TABS: 75.0000 mg | ORAL_TABLET | Freq: Two times a day (BID) | ORAL | 1 refills | Status: DC

## 2020-05-07 NOTE — Progress Notes (Signed)
Virtual Visit via Video   I connected with patient on 05/07/20 at  3:00 PM EDT by a video enabled telemedicine application and verified that I am speaking with the correct person using two identifiers.  Location patient: Home Location provider: Acupuncturist, Office Persons participating in the virtual visit: Patient, Provider, [place] (Jess B)  I discussed the limitations of evaluation and management by telemedicine and the availability of in person appointments. The patient expressed understanding and agreed to proceed.  Subjective:   HPI:   Anxiety- pt has upcoming Nissen and she was told that she is going to have to crush her medication.  The surgeon was concerned b/c her Effexor is extended release.  Pt reports feeling 'so much better' since switching to Effexor and is stressed at the idea of having to go without it.  ROS:   See pertinent positives and negatives per HPI.  Patient Active Problem List   Diagnosis Date Noted  . GERD (gastroesophageal reflux disease) 05/01/2019  . Knee osteoarthritis 09/12/2017  . Anxiety 04/11/2017  . Insomnia 04/11/2017  . Fatty liver 09/27/2016    Social History   Tobacco Use  . Smoking status: Never Smoker  . Smokeless tobacco: Never Used  Substance Use Topics  . Alcohol use: Yes    Comment: occasionally    Current Outpatient Medications:  .  acetaminophen (TYLENOL) 500 MG tablet, Take 1,000 mg by mouth every 6 (six) hours as needed for mild pain or headache., Disp: , Rfl:  .  ALPRAZolam (XANAX) 0.5 MG tablet, Take 1 tablet (0.5 mg total) by mouth 2 (two) times daily as needed for anxiety., Disp: 30 tablet, Rfl: 1 .  Ascorbic Acid (VITAMIN C) 1000 MG tablet, Take 1,000 mg by mouth daily., Disp: , Rfl:  .  Cholecalciferol (VITAMIN D3) 125 MCG (5000 UT) CAPS, Take 5,000 Units by mouth daily at 12 noon., Disp: , Rfl:  .  pantoprazole (PROTONIX) 40 MG tablet, Take 1 tablet (40 mg total) by mouth daily., Disp: 30 tablet, Rfl: 3 .   sucralfate (CARAFATE) 1 g tablet, Take 1 tablet (1 g total) by mouth 4 (four) times daily -  with meals and at bedtime., Disp: 90 tablet, Rfl: 1 .  traZODone (DESYREL) 100 MG tablet, TAKE 1 TABLET BY MOUTH EVERYDAY AT BEDTIME, Disp: 30 tablet, Rfl: 3 .  venlafaxine XR (EFFEXOR-XR) 75 MG 24 hr capsule, TAKE 1 CAPSULE (75 MG TOTAL) BY MOUTH DAILY WITH BREAKFAST., Disp: 30 capsule, Rfl: 3 .  famotidine (PEPCID) 40 MG tablet, Take 1 tablet (40 mg total) by mouth 2 (two) times daily. (Patient not taking: Reported on 03/12/2020), Disp: 60 tablet, Rfl: 3  Allergies  Allergen Reactions  . Penicillins Hives and Nausea And Vomiting    Has patient had a PCN reaction causing immediate rash, facial/tongue/throat swelling, SOB or lightheadedness with hypotension: no Has patient had a PCN reaction causing severe rash involving mucus membranes or skin necrosis: unknown Has patient had a PCN reaction that required hospitalization unknown Has patient had a PCN reaction occurring within the last 10 years: unknown If all of the above answers are "NO", then may proceed with Cephalosporin use.     Objective:   There were no vitals taken for this visit. AAOx3, NAD NCAT, EOMI No obvious CN deficits Coloring WNL Pt is able to speak clearly, coherently without shortness of breath or increased work of breathing.  Thought process is linear.  Mood is appropriate.   Assessment and Plan:  Anxiety- improved since switching to Effexor.  Since she has upcoming Nissen, will switch from extended release to immediate release BID dosing and she will be able to crush pills w/o problem.  Pt expressed understanding and is in agreement w/ plan.    [name], MD 05/07/2020

## 2020-05-07 NOTE — Progress Notes (Signed)
I have discussed the procedure for the virtual visit with the patient who has given consent to proceed with assessment and treatment.   Karalee Hauter L Laurann Mcmorris, CMA     

## 2020-05-25 NOTE — Pre-Procedure Instructions (Signed)
[id number] [address], [place] [address] Phone: [phone] Fax: [phone]  [id number] - [place] [place], [place] AT [address] [place] [address] Phone: [phone] Fax: [phone]    Your procedure is scheduled on Tues., Aug. 17, 2021 from 12:45PM-3:45PM  Report to [facility] Entrance "A" at 10:45AM  Call this number if you have problems the morning of surgery:  [phone]   Remember:  Do not eat after midnight on Aug. 16th  You may drink clear liquids until 3 hours (9:45AM) prior to surgery time.  Clear liquids allowed are: Water, Juice (non-citric and without pulp - diabetics please choose diet or no sugar options), Carbonated beverages - (diabetics please choose diet or no sugar options), Clear Tea, Black Coffee only (no creamer, milk or cream including half and half), Plain Jell-O only (diabetics please choose diet or no sugar options), Gatorade (diabetics please choose diet or no sugar options) and Plain Popsicles only   Please complete your PRE-SURGERY ENSURE drink that was provided to you by 9:45AM the morning of surgery.  Please, if able, drink it in one setting. DO NOT SIP.    Take these medicines the morning of surgery with A SIP OF WATER: Famotidine (PEPCID) Pantoprazole (PROTONIX) Venlafaxine (EFFEXOR)      If Needed: Acetaminophen (TYLENOL) ALPRAZolam (XANAX)   As of today, STOP taking all Aspirin (unless instructed by your doctor) and Other Aspirin containing products, Vitamins, Fish oils, and Herbal medications. Also stop all NSAIDS i.e. Advil, Ibuprofen, Motrin, Aleve, Anaprox, Naproxen, BC, Goody Powders, and all Supplements.  No Smoking of any kind, Tobacco/Vaping, or Alcohol products 24 hours prior to your procedure. If you use a Cpap at night, you may bring all equipment for your overnight stay.   Special  instructions: [place]- Preparing For Surgery  Before surgery, you can play an important role. Because skin is not sterile, your skin needs to be as free of germs as possible. You can reduce the number of germs on your skin by washing with CHG (chlorahexidine gluconate) Soap before surgery.  CHG is an antiseptic cleaner which kills germs and bonds with the skin to continue killing germs even after washing.    Please do not use if you have an allergy to CHG or antibacterial soaps. If your skin becomes reddened/irritated stop using the CHG.  Do not shave (including legs and underarms) for at least 48 hours prior to first CHG shower. It is OK to shave your face.  Please follow these instructions carefully.   1. Shower the NIGHT BEFORE SURGERY and the MORNING OF SURGERY with CHG.   2. If you chose to wash your hair, wash your hair first as usual with your normal shampoo.  3. After you shampoo, rinse your hair and body thoroughly to remove the shampoo.  4. Use CHG as you would any other liquid soap. You can apply CHG directly to the skin and wash gently with a scrungie or a clean washcloth.   5. Apply the CHG Soap to your body ONLY FROM THE NECK DOWN.  Do not use on open wounds or open sores. Avoid contact with your eyes, ears, mouth and genitals (private parts). Wash Face and genitals (private parts)  with your normal soap.  6. Wash thoroughly, paying special attention to the area where your surgery will be performed.  7. Thoroughly rinse your body with warm water from the  neck down.  8. DO NOT shower/wash with your normal soap after using and rinsing off the CHG Soap.  9. Pat yourself dry with a CLEAN TOWEL.  10. Wear CLEAN PAJAMAS to bed the night before surgery, wear comfortable clothes the morning of surgery  11. Place CLEAN SHEETS on your bed the night of your first shower and DO NOT SLEEP WITH PETS.   Day of Surgery:            Remember to brush your teeth WITH YOUR REGULAR  TOOTHPASTE.  Do not wear jewelry, make-up or nail polish.  Do not wear lotions, powders, or perfumes, or deodorant.  Do not shave 48 hours prior to surgery.    Do not bring valuables to the hospital.  [facility] is not responsible for any belongings or valuables.  Contacts, dentures or bridgework may not be worn into surgery.    For patients admitted to the hospital, discharge time will be determined by your treatment team.  Patients discharged the day of surgery will not be allowed to drive home, and someone age 56 and over needs to stay with them for 24 hours.  Please wear clean clothes to the hospital/surgery center.    Please read over the following fact sheets that you were given.

## 2020-05-26 ENCOUNTER — Encounter (HOSPITAL_COMMUNITY): Payer: Self-pay

## 2020-05-26 ENCOUNTER — Encounter (HOSPITAL_COMMUNITY)
Admission: RE | Admit: 2020-05-26 | Discharge: 2020-05-26 | Disposition: A | Discharge status: Home / Self Care | Payer: BC Managed Care – PPO | Source: Ambulatory Visit | Attending: General Surgery | Admitting: General Surgery

## 2020-05-26 ENCOUNTER — Other Ambulatory Visit: Payer: Self-pay

## 2020-05-26 DIAGNOSIS — Z01812 Encounter for preprocedural laboratory examination: Secondary | ICD-10-CM | POA: Insufficient documentation

## 2020-05-26 HISTORY — DX: Anemia, unspecified: D64.9

## 2020-05-26 LAB — CBC
HCT: 36.4 % (ref 36.0–46.0)
Hemoglobin: 11.1 g/dL — ABNORMAL LOW (ref 12.0–15.0)
MCH: 27.3 pg (ref 26.0–34.0)
MCHC: 30.5 g/dL (ref 30.0–36.0)
MCV: 89.7 fL (ref 80.0–100.0)
Platelets: 305 10*3/uL (ref 150–400)
RBC: 4.06 MIL/uL (ref 3.87–5.11)
RDW: 13.6 % (ref 11.5–15.5)
WBC: 6.1 10*3/uL (ref 4.0–10.5)
nRBC: 0 % (ref 0.0–0.2)

## 2020-05-26 NOTE — Progress Notes (Signed)
PCP - Dr. [name] Cardiologist - denies  PPM/ICD - denies  Chest x-ray - N/A EKG - 09/29/2019 Stress Test - denies ECHO - denies Cardiac Cath - denies   Sleep Study -  Yes, per patient not diagnosed with OSA, was told has mild snoring CPAP - N/A  DM: denies  Blood Thinner Instructions: N/A Aspirin Instructions: N/A  ERAS Protcol - Yes PRE-SURGERY Ensure or G2- Ensure given  COVID TEST- Scheduled for 05/28/2020.  Patient verbalized understanding of self-quarantine instructions, appointment time and place.  Anesthesia review: No  Patient denies shortness of breath, fever, cough and chest pain at PAT appointment  All instructions explained to the patient, with a verbal understanding of the material. Patient agrees to go over the instructions while at home for a better understanding. Patient also instructed to self quarantine after being tested for COVID-19. The opportunity to ask questions was provided.

## 2020-05-28 ENCOUNTER — Other Ambulatory Visit (HOSPITAL_COMMUNITY): Payer: BC Managed Care – PPO

## 2020-05-28 ENCOUNTER — Other Ambulatory Visit (HOSPITAL_COMMUNITY)
Admission: RE | Admit: 2020-05-28 | Discharge: 2020-05-28 | Disposition: A | Discharge status: Home / Self Care | Payer: BC Managed Care – PPO | Source: Ambulatory Visit | Attending: General Surgery | Admitting: General Surgery

## 2020-05-28 DIAGNOSIS — Z01812 Encounter for preprocedural laboratory examination: Secondary | ICD-10-CM | POA: Insufficient documentation

## 2020-05-28 DIAGNOSIS — Z20822 Contact with and (suspected) exposure to covid-19: Secondary | ICD-10-CM | POA: Diagnosis not present

## 2020-05-28 LAB — SARS CORONAVIRUS 2 (TAT 6-24 HRS): SARS Coronavirus 2: NEGATIVE

## 2020-05-31 MED ORDER — VANCOMYCIN HCL 1500 MG/300ML IV SOLN
1500.0000 mg | INTRAVENOUS | Status: AC
  Administered 2020-06-01: 1500 mg via INTRAVENOUS
  Filled 2020-05-31: qty 300

## 2020-06-01 ENCOUNTER — Other Ambulatory Visit: Payer: Self-pay

## 2020-06-01 ENCOUNTER — Encounter (HOSPITAL_COMMUNITY): Payer: Self-pay | Admitting: General Surgery

## 2020-06-01 ENCOUNTER — Ambulatory Visit (HOSPITAL_COMMUNITY): Payer: BC Managed Care – PPO | Admitting: Certified Registered Nurse Anesthetist

## 2020-06-01 ENCOUNTER — Observation Stay (HOSPITAL_COMMUNITY)
Admission: AD | Admit: 2020-06-01 | Discharge: 2020-06-02 | Disposition: A | Discharge status: Home / Self Care | Payer: BC Managed Care – PPO | Source: Ambulatory Visit | Attending: General Surgery | Admitting: General Surgery

## 2020-06-01 ENCOUNTER — Encounter (HOSPITAL_COMMUNITY)
Admission: AD | Disposition: A | Discharge status: Home / Self Care | Payer: Self-pay | Source: Ambulatory Visit | Attending: General Surgery

## 2020-06-01 DIAGNOSIS — K219 Gastro-esophageal reflux disease without esophagitis: Principal | ICD-10-CM | POA: Insufficient documentation

## 2020-06-01 DIAGNOSIS — Z9889 Other specified postprocedural states: Secondary | ICD-10-CM

## 2020-06-01 HISTORY — PX: LAPAROSCOPIC NISSEN FUNDOPLICATION: SHX1932

## 2020-06-01 LAB — POCT PREGNANCY, URINE: Preg Test, Ur: NEGATIVE

## 2020-06-01 SURGERY — FUNDOPLICATION, NISSEN, ROBOT-ASSISTED, LAPAROSCOPIC
Anesthesia: General | Site: Abdomen

## 2020-06-01 MED ORDER — ORAL CARE MOUTH RINSE: 15.0000 mL | Freq: Once | OROMUCOSAL | Status: AC

## 2020-06-01 MED ORDER — MIDAZOLAM HCL 2 MG/2ML IJ SOLN
INTRAMUSCULAR | Status: AC
  Filled 2020-06-01: qty 2

## 2020-06-01 MED ORDER — MIDAZOLAM HCL 5 MG/5ML IJ SOLN
INTRAMUSCULAR | Status: DC | PRN
  Administered 2020-06-01: 2 mg via INTRAVENOUS

## 2020-06-01 MED ORDER — ONDANSETRON HCL 4 MG/2ML IJ SOLN: 4.0000 mg | Freq: Four times a day (QID) | INTRAMUSCULAR | Status: DC | PRN

## 2020-06-01 MED ORDER — CHLORHEXIDINE GLUCONATE CLOTH 2 % EX PADS: 6.0000 | MEDICATED_PAD | Freq: Once | CUTANEOUS | Status: DC

## 2020-06-01 MED ORDER — LACTATED RINGERS IV SOLN: INTRAVENOUS | Status: DC

## 2020-06-01 MED ORDER — 0.9 % SODIUM CHLORIDE (POUR BTL) OPTIME
TOPICAL | Status: DC | PRN
  Administered 2020-06-01: 1000 mL

## 2020-06-01 MED ORDER — CHLORHEXIDINE GLUCONATE 0.12 % MT SOLN: 15.0000 mL | Freq: Once | OROMUCOSAL | Status: AC

## 2020-06-01 MED ORDER — CELECOXIB 200 MG PO CAPS
ORAL_CAPSULE | ORAL | Status: AC
  Administered 2020-06-01: 400 mg via ORAL
  Filled 2020-06-01: qty 2

## 2020-06-01 MED ORDER — SUCCINYLCHOLINE CHLORIDE 200 MG/10ML IV SOSY
PREFILLED_SYRINGE | INTRAVENOUS | Status: AC
  Filled 2020-06-01: qty 10

## 2020-06-01 MED ORDER — EPHEDRINE 5 MG/ML INJ
INTRAVENOUS | Status: AC
  Filled 2020-06-01: qty 10

## 2020-06-01 MED ORDER — NORMAL SALINE FLUSH 0.9 % IV SOLN
INTRAVENOUS | Status: DC | PRN
  Administered 2020-06-01 (×2): 10 mL

## 2020-06-01 MED ORDER — BUPIVACAINE LIPOSOME 1.3 % IJ SUSP
INTRAMUSCULAR | Status: DC | PRN
  Administered 2020-06-01: 20 mL

## 2020-06-01 MED ORDER — SUGAMMADEX SODIUM 200 MG/2ML IV SOLN
INTRAVENOUS | Status: DC | PRN
  Administered 2020-06-01: 250 mg via INTRAVENOUS

## 2020-06-01 MED ORDER — FENTANYL CITRATE (PF) 100 MCG/2ML IJ SOLN: 25.0000 ug | INTRAMUSCULAR | Status: DC | PRN

## 2020-06-01 MED ORDER — ACETAMINOPHEN 500 MG PO TABS: 1000.0000 mg | ORAL_TABLET | ORAL | Status: AC

## 2020-06-01 MED ORDER — STERILE WATER FOR INJECTION IJ SOLN
INTRAMUSCULAR | Status: DC | PRN
  Administered 2020-06-01: 1000 mL

## 2020-06-01 MED ORDER — AMISULPRIDE (ANTIEMETIC) 5 MG/2ML IV SOLN: 10.0000 mg | Freq: Once | INTRAVENOUS | Status: DC | PRN

## 2020-06-01 MED ORDER — FENTANYL CITRATE (PF) 250 MCG/5ML IJ SOLN
INTRAMUSCULAR | Status: DC | PRN
  Administered 2020-06-01: 50 ug via INTRAVENOUS
  Administered 2020-06-01: 100 ug via INTRAVENOUS
  Administered 2020-06-01: 50 ug via INTRAVENOUS

## 2020-06-01 MED ORDER — ONDANSETRON HCL 4 MG/2ML IJ SOLN
INTRAMUSCULAR | Status: AC
  Filled 2020-06-01: qty 2

## 2020-06-01 MED ORDER — PHENYLEPHRINE 40 MCG/ML (10ML) SYRINGE FOR IV PUSH (FOR BLOOD PRESSURE SUPPORT)
PREFILLED_SYRINGE | INTRAVENOUS | Status: DC | PRN
  Administered 2020-06-01: 80 ug via INTRAVENOUS

## 2020-06-01 MED ORDER — GABAPENTIN 300 MG PO CAPS: 300.0000 mg | ORAL_CAPSULE | ORAL | Status: AC

## 2020-06-01 MED ORDER — BUPIVACAINE HCL (PF) 0.25 % IJ SOLN
INTRAMUSCULAR | Status: DC | PRN
  Administered 2020-06-01: 10 mL

## 2020-06-01 MED ORDER — OXYCODONE HCL 5 MG PO TABS: 5.0000 mg | ORAL_TABLET | Freq: Once | ORAL | Status: DC | PRN

## 2020-06-01 MED ORDER — ROCURONIUM BROMIDE 10 MG/ML (PF) SYRINGE
PREFILLED_SYRINGE | INTRAVENOUS | Status: AC
  Filled 2020-06-01: qty 10

## 2020-06-01 MED ORDER — ONDANSETRON 4 MG PO TBDP: 4.0000 mg | ORAL_TABLET | Freq: Four times a day (QID) | ORAL | Status: DC | PRN

## 2020-06-01 MED ORDER — PHENYLEPHRINE 40 MCG/ML (10ML) SYRINGE FOR IV PUSH (FOR BLOOD PRESSURE SUPPORT)
PREFILLED_SYRINGE | INTRAVENOUS | Status: AC
  Filled 2020-06-01: qty 10

## 2020-06-01 MED ORDER — PROPOFOL 10 MG/ML IV BOLUS
INTRAVENOUS | Status: DC | PRN
  Administered 2020-06-01: 150 mg via INTRAVENOUS

## 2020-06-01 MED ORDER — DEXAMETHASONE SODIUM PHOSPHATE 10 MG/ML IJ SOLN
4.0000 mg | INTRAMUSCULAR | Status: AC
  Administered 2020-06-01: 5 mg via INTRAVENOUS

## 2020-06-01 MED ORDER — FENTANYL CITRATE (PF) 250 MCG/5ML IJ SOLN
INTRAMUSCULAR | Status: AC
  Filled 2020-06-01: qty 5

## 2020-06-01 MED ORDER — HYDROMORPHONE HCL 1 MG/ML IJ SOLN
1.0000 mg | INTRAMUSCULAR | Status: DC | PRN
  Administered 2020-06-01 – 2020-06-02 (×3): 1 mg via INTRAVENOUS
  Filled 2020-06-01 (×3): qty 1

## 2020-06-01 MED ORDER — ONDANSETRON HCL 4 MG/2ML IJ SOLN
INTRAMUSCULAR | Status: DC | PRN
  Administered 2020-06-01: 4 mg via INTRAVENOUS

## 2020-06-01 MED ORDER — GABAPENTIN 300 MG PO CAPS
ORAL_CAPSULE | ORAL | Status: AC
  Administered 2020-06-01: 300 mg via ORAL
  Filled 2020-06-01: qty 1

## 2020-06-01 MED ORDER — MEPERIDINE HCL 25 MG/ML IJ SOLN: 6.2500 mg | INTRAMUSCULAR | Status: DC | PRN

## 2020-06-01 MED ORDER — DEXAMETHASONE SODIUM PHOSPHATE 10 MG/ML IJ SOLN
INTRAMUSCULAR | Status: AC
  Filled 2020-06-01: qty 2

## 2020-06-01 MED ORDER — BUPIVACAINE LIPOSOME 1.3 % IJ SUSP
20.0000 mL | Freq: Once | INTRAMUSCULAR | Status: DC
  Filled 2020-06-01: qty 20

## 2020-06-01 MED ORDER — PROMETHAZINE HCL 25 MG/ML IJ SOLN: 6.2500 mg | INTRAMUSCULAR | Status: DC | PRN

## 2020-06-01 MED ORDER — ENSURE PRE-SURGERY PO LIQD: 296.0000 mL | Freq: Once | ORAL | Status: DC

## 2020-06-01 MED ORDER — BUPIVACAINE HCL (PF) 0.25 % IJ SOLN
INTRAMUSCULAR | Status: AC
  Filled 2020-06-01: qty 30

## 2020-06-01 MED ORDER — CHLORHEXIDINE GLUCONATE 0.12 % MT SOLN
OROMUCOSAL | Status: AC
  Administered 2020-06-01: 15 mL via OROMUCOSAL
  Filled 2020-06-01: qty 15

## 2020-06-01 MED ORDER — ROCURONIUM BROMIDE 10 MG/ML (PF) SYRINGE
PREFILLED_SYRINGE | INTRAVENOUS | Status: DC | PRN
  Administered 2020-06-01: 10 mg via INTRAVENOUS
  Administered 2020-06-01: 20 mg via INTRAVENOUS
  Administered 2020-06-01: 60 mg via INTRAVENOUS

## 2020-06-01 MED ORDER — PROPOFOL 10 MG/ML IV BOLUS
INTRAVENOUS | Status: AC
  Filled 2020-06-01: qty 20

## 2020-06-01 MED ORDER — ACETAMINOPHEN 500 MG PO TABS
ORAL_TABLET | ORAL | Status: AC
  Administered 2020-06-01: 1000 mg via ORAL
  Filled 2020-06-01: qty 2

## 2020-06-01 MED ORDER — OXYCODONE HCL 5 MG/5ML PO SOLN: 5.0000 mg | Freq: Once | ORAL | Status: DC | PRN

## 2020-06-01 MED ORDER — ACETAMINOPHEN 325 MG PO TABS: 325.0000 mg | ORAL_TABLET | Freq: Once | ORAL | Status: DC | PRN

## 2020-06-01 MED ORDER — ACETAMINOPHEN 10 MG/ML IV SOLN: 1000.0000 mg | Freq: Once | INTRAVENOUS | Status: DC | PRN

## 2020-06-01 MED ORDER — ACETAMINOPHEN 160 MG/5ML PO SOLN: 325.0000 mg | Freq: Once | ORAL | Status: DC | PRN

## 2020-06-01 MED ORDER — LACTATED RINGERS IV SOLN
INTRAVENOUS | Status: DC | PRN
Start: 2020-06-01 — End: 2020-06-01

## 2020-06-01 MED ORDER — HYDROMORPHONE HCL 1 MG/ML IJ SOLN: 0.2500 mg | INTRAMUSCULAR | Status: DC | PRN

## 2020-06-01 MED ORDER — LIDOCAINE 2% (20 MG/ML) 5 ML SYRINGE
INTRAMUSCULAR | Status: AC
  Filled 2020-06-01: qty 5

## 2020-06-01 MED ORDER — LIDOCAINE 2% (20 MG/ML) 5 ML SYRINGE
INTRAMUSCULAR | Status: DC | PRN
  Administered 2020-06-01: 40 mg via INTRAVENOUS

## 2020-06-01 MED ORDER — CELECOXIB 200 MG PO CAPS: 400.0000 mg | ORAL_CAPSULE | ORAL | Status: AC

## 2020-06-01 MED ORDER — PROPOFOL 500 MG/50ML IV EMUL
INTRAVENOUS | Status: DC | PRN
  Administered 2020-06-01: 20 ug/kg/min via INTRAVENOUS

## 2020-06-01 MED FILL — Sodium Chloride Flush IV Soln 0.9%: INTRAVENOUS | Qty: 10 | Status: AC

## 2020-06-01 SURGICAL SUPPLY — 52 items
APPLIER CLIP 5 13 M/L LIGAMAX5 (MISCELLANEOUS)
CHLORAPREP W/TINT 26 (MISCELLANEOUS) ×3 IMPLANT
CLIP APPLIE 5 13 M/L LIGAMAX5 (MISCELLANEOUS) IMPLANT
COVER MAYO STAND STRL (DRAPES) ×3 IMPLANT
COVER SURGICAL LIGHT HANDLE (MISCELLANEOUS) ×3 IMPLANT
COVER TIP SHEARS 8 DVNC (MISCELLANEOUS) IMPLANT
COVER TIP SHEARS 8MM DA VINCI (MISCELLANEOUS)
DECANTER SPIKE VIAL GLASS SM (MISCELLANEOUS) ×3 IMPLANT
DEFOGGER SCOPE WARMER CLEARIFY (MISCELLANEOUS) ×3 IMPLANT
DERMABOND ADVANCED (GAUZE/BANDAGES/DRESSINGS) ×2
DERMABOND ADVANCED .7 DNX12 (GAUZE/BANDAGES/DRESSINGS) ×1 IMPLANT
DRAIN PENROSE 0.5X18 (DRAIN) IMPLANT
DRAPE ARM DVNC X/XI (DISPOSABLE) ×4 IMPLANT
DRAPE CARDIOVASC SPLIT 88X140 (DRAPES) ×3 IMPLANT
DRAPE COLUMN DVNC XI (DISPOSABLE) ×1 IMPLANT
DRAPE DA VINCI XI ARM (DISPOSABLE) ×8
DRAPE DA VINCI XI COLUMN (DISPOSABLE) ×2
DRAPE ORTHO SPLIT 77X108 STRL (DRAPES) ×2
DRAPE SURG ORHT 6 SPLT 77X108 (DRAPES) ×1 IMPLANT
ELECT REM PT RETURN 9FT ADLT (ELECTROSURGICAL) ×3
ELECTRODE REM PT RTRN 9FT ADLT (ELECTROSURGICAL) ×1 IMPLANT
GLOVE BIO SURGEON STRL SZ7.5 (GLOVE) ×9 IMPLANT
GOWN STRL REUS W/ TWL XL LVL3 (GOWN DISPOSABLE) ×2 IMPLANT
GOWN STRL REUS W/TWL 2XL LVL3 (GOWN DISPOSABLE) ×3 IMPLANT
GOWN STRL REUS W/TWL XL LVL3 (GOWN DISPOSABLE) ×4
GRASPER SUT TROCAR 14GX15 (MISCELLANEOUS) IMPLANT
KIT BASIN OR (CUSTOM PROCEDURE TRAY) ×3 IMPLANT
KIT TURNOVER KIT B (KITS) IMPLANT
MARKER SKIN DUAL TIP RULER LAB (MISCELLANEOUS) ×3 IMPLANT
NEEDLE INSUFFLATION 14GA 120MM (NEEDLE) ×3 IMPLANT
OBTURATOR OPTICAL STANDARD 8MM (TROCAR)
OBTURATOR OPTICAL STND 8 DVNC (TROCAR)
OBTURATOR OPTICALSTD 8 DVNC (TROCAR) IMPLANT
PENCIL SMOKE EVACUATOR (MISCELLANEOUS) IMPLANT
SCISSORS LAP 5X35 DISP (ENDOMECHANICALS) IMPLANT
SEAL CANN UNIV 5-8 DVNC XI (MISCELLANEOUS) ×4 IMPLANT
SEAL XI 5MM-8MM UNIVERSAL (MISCELLANEOUS) ×8
SEALER VESSEL DA VINCI XI (MISCELLANEOUS) ×2
SEALER VESSEL EXT DVNC XI (MISCELLANEOUS) ×1 IMPLANT
SET IRRIG TUBING LAPAROSCOPIC (IRRIGATION / IRRIGATOR) ×3 IMPLANT
SET TUBE SMOKE EVAC HIGH FLOW (TUBING) ×3 IMPLANT
SLEEVE ENDOPATH XCEL 5M (ENDOMECHANICALS) IMPLANT
STAPLER VISISTAT 35W (STAPLE) IMPLANT
STOPCOCK 4 WAY LG BORE MALE ST (IV SETS) ×3 IMPLANT
SUT ETHIBOND 0 36 GRN (SUTURE) IMPLANT
SUT ETHIBOND 2 0 SH (SUTURE)
SUT ETHIBOND 2 0 SH 36X2 (SUTURE) IMPLANT
SUT MNCRL AB 4-0 PS2 18 (SUTURE) ×3 IMPLANT
SUT SILK 2 0 SH (SUTURE) ×3 IMPLANT
TRAY FOLEY MTR SLVR 16FR STAT (SET/KITS/TRAYS/PACK) ×3 IMPLANT
TRAY LAPAROSCOPIC MC (CUSTOM PROCEDURE TRAY) ×3 IMPLANT
TROCAR ADV FIXATION 5X100MM (TROCAR) ×3 IMPLANT

## 2020-06-01 NOTE — Anesthesia Preprocedure Evaluation (Addendum)
Anesthesia Evaluation  Patient identified by MRN, date of birth, ID band Patient awake    Reviewed: Allergy & Precautions, NPO status , Patient's Chart, lab work & pertinent test results  History of Anesthesia Complications (+) PONV and history of anesthetic complications  Airway Mallampati: I  TM Distance: >3 FB Neck ROM: Full    Dental  (+) Teeth Intact, Dental Advisory Given   Pulmonary    breath sounds clear to auscultation       Cardiovascular  Rhythm:Regular Rate:Normal     Neuro/Psych PSYCHIATRIC DISORDERS Anxiety Depression    GI/Hepatic Neg liver ROS, GERD  Medicated,  Endo/Other  negative endocrine ROS  Renal/GU negative Renal ROS     Musculoskeletal  (+) Arthritis ,   Abdominal (+) + obese,   Peds  Hematology   Anesthesia Other Findings   Reproductive/Obstetrics                            Anesthesia Physical Anesthesia Plan  ASA: III  Anesthesia Plan: General   Post-op Pain Management:    Induction: Intravenous  PONV Risk Score and Plan: 4 or greater and Ondansetron, Dexamethasone, Midazolam and Scopolamine patch - Pre-op  Airway Management Planned: Oral ETT  Additional Equipment: None  Intra-op Plan:   Post-operative Plan: Extubation in OR  Informed Consent: I have reviewed the patients History and Physical, chart, labs and discussed the procedure including the risks, benefits and alternatives for the proposed anesthesia with the patient or authorized representative who has indicated his/her understanding and acceptance.     Dental advisory given  Plan Discussed with: CRNA  Anesthesia Plan Comments:        Anesthesia Quick Evaluation

## 2020-06-01 NOTE — H&P (Signed)
  History of Present Illness  The patient is a 33 year old female who presents with a complaint of chronic reflux. Patient is a 34 year old female, comes in secondary to continued reflux. Since her last discussion patient has lost approximately 10 pounds. 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 Patient continues with her reflux continues with her reflux medication.  ---------------------- Patient is a 34 year old female with a history of obesity, chronic reflux over the last year. She states that this is increased significantly. Patient states that she has significant burning in her esophagus as well as her stoma. Patient has typical symptoms to include within of with water brash, sore mouth, coughing. Patient states that she recently underwent pH probe study. Each probe study by Dr. Ardis Hughs did reveal a DeMeester score of 39.3. Patient also underwent endoscopy which I reviewed this was within normal limits. Patient also underwent CT scan in December 2020 secondary to abdominal pain. This did not show any hiatal hernia.    Patient had previous laparoscopic cholecystectomy. Patient's weight today was 269, her BMI was 46   Allergies Penicillin G Pot in Dextrose *PENICILLINS*  Allergies Reconciled   Medication History Carafate (Oral) Specific strength unknown - Active. Famotidine (40MG  Tablet, Oral) Active. traZODone HCl (100MG  Tablet, Oral) Active. Venlafaxine HCl ER (75MG  Capsule ER 24HR, Oral) Active. Pantoprazole Sodium (40MG  Tablet DR, Oral) Active. Medications Reconciled    Review of Systems  HEENT Present- Wears glasses/contact lenses. Not Present- Earache, Hearing Loss, Hoarseness, Nose Bleed, Oral Ulcers, Ringing in the Ears, Seasonal Allergies, Sinus Pain, Sore Throat, Visual Disturbances and Yellow Eyes. Psychiatric Present- Anxiety. Not Present- Bipolar, Change in Sleep Pattern, Depression, Fearful and Frequent  crying.  Vitals  04/27/2020 3:56 PM Weight: 262.25 lb Height: 64in Body Surface Area: 2.19 m Body Mass Index: 45.01 kg/m  Temp.: 46F  Pulse: 126 (Regular)  BP: 122/72(Sitting, Left Arm, Standard)       Physical Exam  The physical exam findings are as follows: Note: Constitutional: No acute distress, conversant, appears stated age  Eyes: Anicteric sclerae, moist conjunctiva, no lid lag  Neck: No thyromegaly, trachea midline, no cervical lymphadenopathy  Lungs: Clear to auscultation biilaterally, normal respiratory effot  Cardiovascular: regular rate & rhythm, no murmurs, no peripheal edema, pedal pulses 2+  GI: Soft, no masses or hepatosplenomegaly, non-tender to palpation  MSK: Normal gait, no clubbing cyanosis, edema  Skin: No rashes, palpation reveals normal skin turgor  Psychiatric: Appropriate judgment and insight, oriented to person, place, and time    Assessment & Plan  CHRONIC GASTROESOPHAGEAL REFLUX DISEASE (K21.9) Impression: Patient is a 34 year old female who comes in secondary to chronic reflux.  The patient's weight is trending in the right direction. I would be comfortable with proceeding with surgery at this time. Patient would require a robotic toupee versus Nissen fundoplication. I discussed with her the possible consultations surgery to include but not limited to: Infection, bleeding, damages urination structures to include esophagus, intestines, stomach, pneumothorax, and possible recurrence. Patient voiced understanding and wished to proceed.

## 2020-06-01 NOTE — Op Note (Signed)
06/01/2020  2:24 PM  PATIENT:  Krystal Harris  34 y.o. female  PRE-OPERATIVE DIAGNOSIS:  GERD  POST-OPERATIVE DIAGNOSIS:  GERD  PROCEDURE:  Procedure(s): ROBOTIC TOUPET FUNDOPLICATION (N/A)  SURGEON:  Surgeon(s) and Role:    * [name], MD - Primary    * Kinsinger, Arta Bruce, MD - Assisting- who was essential in helping with retraction, and the fundoplication.  ANESTHESIA:   local, regional and general  EBL:  20 mL   BLOOD ADMINISTERED:none  DRAINS: none   LOCAL MEDICATIONS USED:  BUPIVICAINE   SPECIMEN:  No Specimen  DISPOSITION OF SPECIMEN:  N/A  COUNTS:  YES  TOURNIQUET:  * No tourniquets in log *  DICTATION: .Dragon Dictation The patient was taken back to the operating room and placed in the supine position with bilateral SCDs in place. The patient was prepped and draped in the usual sterile fashion. After appropriate antibiotics were confirmed a timeout was called and all facts were verified.   A Veress needle technique was used to insufflate the abdomen to 15 mm of mercury the paramedian stab incision. Subsequent to this an 8 mm trocar was introduced as was a 8 millimeter camera. At this time the subsequent robotic trochars x3, were then placed adjacent to this trocar approximately 8-10 cm away. Each trocar was inserted under direct visualization, there were total of 4 trochars. The assistant trocar was then placed in the right lower quadrant under direct visualization. The Nathanson retractor was then visualized inserted into the abdomen and the incision just to the left of the falciform ligament. This was then placed to retract the liver appropriately. At this time the patient was positioned in reverse Trendelenburg.   At this time the robot patient cart was brought to the bedside and placed in good position and the arms were docked to the trochars appropriately. At this time I proceeded to incised the gastrohepatic ligament.  At this time I proceeded to  mobilize the stomach inferiorly and visualize the right crus. The peritoneum over the right crus was incised and right crus was identified. I proceeded to dissect this inferiorly until the left crus was seen joining the right crus. Once the right crus was adequately dissected we turned our to the left crus which was dissected away. This required traction of the stomach to the right side. Once this was visualized we then proceeded to circumferentially dissect the esophagus away from the surrounding tissue. The anterior and posterior vagus was seen along the esophagus at the GE junction.  These were both preserved throughout the entire case.  At this time the phrenoesophageal fat pad was dissected away from the esophagus. There was no hiatal hernia seen. I mobilized the esophagus cephalad approximately 2-3 cm, clearing away the surrounding tissue.   At this time we turned our attention to the greater curvature the stomach and the omentum was mobilized using the robotic vessel sealer. This was taken up to the greater curvature to the hiatus. This mobilized the entire greater curvature to allow mobilization and the wrap. I then proceeded to bring the greater curvature the stomach posterior to the esophagus, and a shoeshine technique was used to evaluate the mobilization of the greater curvature.   At this time I proceeded to close the hiatus using interrupted 0 Ethibonds x 2. This brought together the hiatal closure without undue stricture to the esophagus. At this time since there was no hiatal hernia I decided not to place mesh.  At this time the  greater curvature was brought around the esophagus and sutured using 0 silk sutures interrupted fashion approximately 1 cm apart x3 on each side of the esophagus in a Toupet fashion. A left collar stitch was then used to gastropexy the stomach from the wrap to the diaphragm just lateral to the left crus as.  A second collar stitch was placed from the wrap to the right  crus.  The wrap lay loose with no strangulation of the esophagus.  At this time the robot was undocked. The liver trocar was removed. At this time insufflation was evacuated. Skin was reapproximated for Monocryl subcuticular fashion. The skin was then dressed with Dermabond. The patient tolerated the procedure well and was taken to the recovery room in stable condition.   PLAN OF CARE: Admit for overnight observation  PATIENT DISPOSITION:  PACU - hemodynamically stable.   Delay start of Pharmacological VTE agent (>24hrs) due to surgical blood loss or risk of bleeding: not applicable

## 2020-06-01 NOTE — Anesthesia Procedure Notes (Signed)
Procedure Name: Intubation Date/Time: 06/01/2020 12:58 PM Performed by: Shirlyn Goltz, CRNA Pre-anesthesia Checklist: Patient identified, Emergency Drugs available, Suction available and Patient being monitored Patient Re-evaluated:Patient Re-evaluated prior to induction Oxygen Delivery Method: Circle system utilized Preoxygenation: Pre-oxygenation with 100% oxygen Induction Type: IV induction Ventilation: Mask ventilation without difficulty Laryngoscope Size: Mac and 3 Grade View: Grade I Tube type: Oral Tube size: 7.0 mm Number of attempts: 1 Airway Equipment and Method: Stylet and Oral airway Placement Confirmation: ETT inserted through vocal cords under direct vision,  positive ETCO2 and breath sounds checked- equal and bilateral Secured at: 21 cm Tube secured with: Tape Dental Injury: Teeth and Oropharynx as per pre-operative assessment  Comments: Atraumatic oral intubation by Pierce Crane., SRNA under direct supervision.

## 2020-06-01 NOTE — Discharge Instructions (Signed)
EATING AFTER YOUR ESOPHAGEAL SURGERY (Stomach Fundoplication, Hiatal Hernia repair, Achalasia surgery, etc)  ######################################################################  EAT Start with a pureed / full liquid diet (see below) Gradually transition to a high fiber diet with a fiber supplement over the next month after discharge.    WALK Walk an hour a day.  Control your pain to do that.    CONTROL PAIN Control pain so that you can walk, sleep, tolerate sneezing/coughing, go up/down stairs.  HAVE A BOWEL MOVEMENT DAILY Keep your bowels regular to avoid problems.  OK to try a laxative to override constipation.  OK to use an antidairrheal to slow down diarrhea.  Call if not better after 2 tries  CALL IF YOU HAVE PROBLEMS/CONCERNS Call if you are still struggling despite following these instructions. Call if you have concerns not answered by these instructions  ######################################################################   After your esophageal surgery, expect some sticking with swallowing over the next 1-2 months.    If food sticks when you eat, it is called "dysphagia".  This is due to swelling around your esophagus at the wrap & hiatal diaphragm repair.  It will gradually ease off over the next few months.  To help you through this temporary phase, we start you out on a pureed (blenderized) diet.  Your first meal in the hospital was thin liquids.  You should have been given a pureed diet by the time you left the hospital.  We ask patients to stay on a pureed diet for the first 2-3 weeks to avoid anything getting "stuck" near your recent surgery.  Don't be alarmed if your ability to swallow doesn't progress according to this plan.  Everyone is different and some diets can advance more or less quickly.    It is often helpful to crush your medications or split them as they can sometimes stick, especially the first week or so.   Some BASIC RULES to follow  are:  Maintain an upright position whenever eating or drinking.  Take small bites - just a teaspoon size bite at a time.  Eat slowly.  It may also help to eat only one food at a time.  Consider nibbling through smaller, more frequent meals & avoid the urge to eat BIG meals  Do not push through feelings of fullness, nausea, or bloatedness  Do not mix solid foods and liquids in the same mouthful  Try not to "wash foods down" with large gulps of liquids.  Avoid carbonated (bubbly/fizzy) drinks.    Avoid foods that make you feel gassy or bloated.  Start with bland foods first.  Wait on trying greasy, fried, or spicy meals until you are tolerating more bland solids well.  Understand that it will be hard to burp and belch at first.  This gradually improves with time.  Expect to be more gassy/flatulent/bloated initially.  Walking will help your body manage it better.  Consider using medications for bloating that contain simethicone such as  Maalox or Gas-X   Consider crushing her medications, especially smaller pills.  The ability to swallow pills should get easier after a few weeks  Eat in a relaxed atmosphere & minimize distractions.  Avoid talking while eating.    Do not use straws.  Following each meal, sit in an upright position (90 degree angle) for 60 to 90 minutes.  Going for a short walk can help as well  If food does stick, don't panic.  Try to relax and let the food pass on its own.    Sipping WARM LIQUID such as strong hot black tea can also help slide it down.   Be gradual in changes & use common sense:  -If you easily tolerating a certain "level" of foods, advance to the next level gradually -If you are having trouble swallowing a particular food, then avoid it.   -If food is sticking when you advance your diet, go back to thinner previous diet (the lower LEVEL) for 1-2 days.  LEVEL 1 = PUREED DIET  Do for the first 2 WEEKS AFTER SURGERY  -Foods in this group are  pureed or blenderized to a smooth, mashed potato-like consistency.  -If necessary, the pureed foods can keep their shape with the addition of a thickening agent.   -Meat should be pureed to a smooth, pasty consistency.  Hot broth or gravy may be added to the pureed meat, approximately 1 oz. of liquid per 3 oz. serving of meat. -CAUTION:  If any foods do not puree into a smooth consistency, swallowing will be more difficult.  (For example, nuts or seeds sometimes do not blend well.)  Hot Foods Cold Foods  Pureed scrambled eggs and cheese Pureed cottage cheese  Baby cereals Thickened juices and nectars  Thinned cooked cereals (no lumps) Thickened milk or eggnog  Pureed French toast or pancakes Ensure  Mashed potatoes Ice cream  Pureed parsley, au gratin, scalloped potatoes, candied sweet potatoes Fruit or Italian ice, sherbet  Pureed buttered or alfredo noodles Plain yogurt  Pureed vegetables (no corn or peas) Instant breakfast  Pureed soups and creamed soups Smooth pudding, mousse, custard  Pureed scalloped apples Whipped gelatin  Gravies Sugar, syrup, honey, jelly  Sauces, cheese, tomato, barbecue, white, creamed Cream  Any baby food Creamer  Alcohol in moderation (not beer or champagne) Margarine  Coffee or tea Mayonnaise   Ketchup, mustard   Apple sauce   SAMPLE MENU:  PUREED DIET Breakfast Lunch Dinner   Orange juice, 1/2 cup  Cream of wheat, 1/2 cup  Pineapple juice, 1/2 cup  Pureed turkey, barley soup, 3/4 cup  Pureed Hawaiian chicken, 3 oz   Scrambled eggs, mashed or blended with cheese, 1/2 cup  Tea or coffee, 1 cup   Whole milk, 1 cup   Non-dairy creamer, 2 Tbsp.  Mashed potatoes, 1/2 cup  Pureed cooled broccoli, 1/2 cup  Apple sauce, 1/2 cup  Coffee or tea  Mashed potatoes, 1/2 cup  Pureed spinach, 1/2 cup  Frozen yogurt, 1/2 cup  Tea or coffee      LEVEL 2 = SOFT DIET  After your first 2 weeks, you can advance to a soft diet.   Keep on this  diet until everything goes down easily.  Hot Foods Cold Foods  White fish Cottage cheese  Stuffed fish Junior baby fruit  Baby food meals Semi thickened juices  Minced soft cooked, scrambled, poached eggs nectars  Souffle & omelets Ripe mashed bananas  Cooked cereals Canned fruit, pineapple sauce, milk  potatoes Milkshake  Buttered or Alfredo noodles Custard  Cooked cooled vegetable Puddings, including tapioca  Sherbet Yogurt  Vegetable soup or alphabet soup Fruit ice, Italian ice  Gravies Whipped gelatin  Sugar, syrup, honey, jelly Junior baby desserts  Sauces:  Cheese, creamed, barbecue, tomato, white Cream  Coffee or tea Margarine   SAMPLE MENU:  LEVEL 2 Breakfast Lunch Dinner   Orange juice, 1/2 cup  Oatmeal, 1/2 cup  Scrambled eggs with cheese, 1/2 cup  Decaffeinated tea, 1 cup  Whole milk, 1 cup    Non-dairy creamer, 2 Tbsp  Pineapple juice, 1/2 cup  Minced beef, 3 oz  Gravy, 2 Tbsp  Mashed potatoes, 1/2 cup  Minced fresh broccoli, 1/2 cup  Applesauce, 1/2 cup  Coffee, 1 cup  Turkey, barley soup, 3/4 cup  Minced Hawaiian chicken, 3 oz  Mashed potatoes, 1/2 cup  Cooked spinach, 1/2 cup  Frozen yogurt, 1/2 cup  Non-dairy creamer, 2 Tbsp      LEVEL 3 = CHOPPED DIET  -After all the foods in level 2 (soft diet) are passing through well you should advance up to more chopped foods.  -It is still important to cut these foods into small pieces and eat slowly.  Hot Foods Cold Foods  Poultry Cottage cheese  Chopped Swedish meatballs Yogurt  Meat salads (ground or flaked meat) Milk  Flaked fish (tuna) Milkshakes  Poached or scrambled eggs Soft, cold, dry cereal  Souffles and omelets Fruit juices or nectars  Cooked cereals Chopped canned fruit  Chopped French toast or pancakes Canned fruit cocktail  Noodles or pasta (no rice) Pudding, mousse, custard  Cooked vegetables (no frozen peas, corn, or mixed vegetables) Green salad  Canned small sweet peas  Ice cream  Creamed soup or vegetable soup Fruit ice, Italian ice  Pureed vegetable soup or alphabet soup Non-dairy creamer  Ground scalloped apples Margarine  Gravies Mayonnaise  Sauces:  Cheese, creamed, barbecue, tomato, white Ketchup  Coffee or tea Mustard   SAMPLE MENU:  LEVEL 3 Breakfast Lunch Dinner   Orange juice, 1/2 cup  Oatmeal, 1/2 cup  Scrambled eggs with cheese, 1/2 cup  Decaffeinated tea, 1 cup  Whole milk, 1 cup  Non-dairy creamer, 2 Tbsp  Ketchup, 1 Tbsp  Margarine, 1 tsp  Salt, 1/4 tsp  Sugar, 2 tsp  Pineapple juice, 1/2 cup  Ground beef, 3 oz  Gravy, 2 Tbsp  Mashed potatoes, 1/2 cup  Cooked spinach, 1/2 cup  Applesauce, 1/2 cup  Decaffeinated coffee  Whole milk  Non-dairy creamer, 2 Tbsp  Margarine, 1 tsp  Salt, 1/4 tsp  Pureed turkey, barley soup, 3/4 cup  Barbecue chicken, 3 oz  Mashed potatoes, 1/2 cup  Ground fresh broccoli, 1/2 cup  Frozen yogurt, 1/2 cup  Decaffeinated tea, 1 cup  Non-dairy creamer, 2 Tbsp  Margarine, 1 tsp  Salt, 1/4 tsp  Sugar, 1 tsp    LEVEL 4:  REGULAR FOODS  -Foods in this group are soft, moist, regularly textured foods.   -This level includes meat and breads, which tend to be the hardest things to swallow.   -Eat very slowly, chew well and continue to avoid carbonated drinks. -most people are at this level in 4-6 weeks  Hot Foods Cold Foods  Baked fish or skinned Soft cheeses - cottage cheese  Souffles and omelets Cream cheese  Eggs Yogurt  Stuffed shells Milk  Spaghetti with meat sauce Milkshakes  Cooked cereal Cold dry cereals (no nuts, dried fruit, coconut)  French toast or pancakes Crackers  Buttered toast Fruit juices or nectars  Noodles or pasta (no rice) Canned fruit  Potatoes (all types) Ripe bananas  Soft, cooked vegetables (no corn, lima, or baked beans) Peeled, ripe, fresh fruit  Creamed soups or vegetable soup Cakes (no nuts, dried fruit, coconut)  Canned chicken  noodle soup Plain doughnuts  Gravies Ice cream  Bacon dressing Pudding, mousse, custard  Sauces:  Cheese, creamed, barbecue, tomato, white Fruit ice, Italian ice, sherbet  Decaffeinated tea or coffee Whipped gelatin  Pork chops Regular gelatin     Canned fruited gelatin molds   Sugar, syrup, honey, jam, jelly   Cream   Non-dairy   Margarine   Oil   Mayonnaise   Ketchup   Mustard   TROUBLESHOOTING IRREGULAR BOWELS  1) Avoid extremes of bowel movements (no bad constipation/diarrhea)  2) Miralax 17gm mixed in 8oz. water or juice-daily. May use BID as needed.  3) Gas-x,Phazyme, etc. as needed for gas & bloating.  4) Soft,bland diet. No spicy,greasy,fried foods.  5) Prilosec over-the-counter as needed  6) May hold gluten/wheat products from diet to see if symptoms improve.  7) May try probiotics (Align, Activa, etc) to help calm the bowels down  7) If symptoms become worse call back immediately.    If you have any questions please call our office at [facility]: [phone].  

## 2020-06-01 NOTE — Transfer of Care (Signed)
Immediate Anesthesia Transfer of Care Note  Patient: Krystal Harris  Procedure(s) Performed: ROBOTIC TOUPET FUNDOPLICATION (N/A Abdomen)  Patient Location: PACU  Anesthesia Type:General  Level of Consciousness: awake, alert , oriented and patient cooperative  Airway & Oxygen Therapy: Patient Spontanous Breathing and Patient connected to nasal cannula oxygen  Post-op Assessment: Report given to RN and Post -op Vital signs reviewed and stable  Post vital signs: Reviewed and stable  Last Vitals:  Vitals Value Taken Time  BP    Temp    Pulse 119 06/01/20 1445  Resp    SpO2 96 % 06/01/20 1445  Vitals shown include unvalidated device data.  Last Pain:  Vitals:   06/01/20 1138  TempSrc:   PainSc: 0-No pain      Patients Stated Pain Goal: 4 (82/41/75 3010)  Complications: No complications documented.

## 2020-06-01 NOTE — Progress Notes (Signed)
   06/01/20 1652  Assess: MEWS Score  Temp 99.3 F (37.4 C)  BP 118/75  Pulse Rate 100  Resp 16  SpO2 98 %  O2 Device Room Air  Assess: MEWS Score  MEWS Temp 0  MEWS Systolic 0  MEWS Pulse 0  MEWS RR 0  MEWS LOC 1  MEWS Score 1  MEWS Score Color Green  Assess: if the MEWS score is Yellow or Red  Were vital signs taken at a resting state? Yes  Focused Assessment No change from prior assessment  Early Detection of Sepsis Score *See Row Information* Low  MEWS guidelines implemented *See Row Information* No, vital signs rechecked  Document  Patient Outcome Stabilized after interventions   Patient presented with yellow MEWS from PACU. Patient is alert and oriented. VSS after recheck. MEWS has returned to green.

## 2020-06-02 ENCOUNTER — Observation Stay (HOSPITAL_COMMUNITY): Payer: BC Managed Care – PPO

## 2020-06-02 DIAGNOSIS — K219 Gastro-esophageal reflux disease without esophagitis: Secondary | ICD-10-CM | POA: Diagnosis not present

## 2020-06-02 LAB — BASIC METABOLIC PANEL
Anion gap: 11 (ref 5–15)
BUN: 5 mg/dL — ABNORMAL LOW (ref 6–20)
CO2: 20 mmol/L — ABNORMAL LOW (ref 22–32)
Calcium: 9 mg/dL (ref 8.9–10.3)
Chloride: 106 mmol/L (ref 98–111)
Creatinine, Ser: 0.73 mg/dL (ref 0.44–1.00)
GFR calc Af Amer: >60 mL/min (ref >60)
GFR calc non Af Amer: >60 mL/min (ref >60)
Glucose, Bld: 85 mg/dL (ref 70–99)
Potassium: 4 mmol/L (ref 3.5–5.1)
Sodium: 137 mmol/L (ref 135–145)

## 2020-06-02 MED ORDER — IOHEXOL 300 MG/ML  SOLN
150.0000 mL | Freq: Once | INTRAMUSCULAR | Status: AC | PRN
  Administered 2020-06-02: 25 mL via ORAL

## 2020-06-02 MED ORDER — HYDROCODONE-ACETAMINOPHEN 7.5-325 MG/15ML PO SOLN: 15.0000 mL | Freq: Four times a day (QID) | ORAL | 0 refills | Status: DC | PRN

## 2020-06-02 NOTE — Progress Notes (Signed)
1 Day Post-Op   Subjective/Chief Complaint: Pt doing well this AM Some soreness Ambulating in hall yesterday   Objective: Vital signs in last 24 hours: Temp:  [97.9 F (36.6 C)-99.4 F (37.4 C)] 99.4 F (37.4 C) (08/18 0447) Pulse Rate:  [88-109] 88 (08/18 0447) Resp:  [16-20] 18 (08/18 0447) BP: (103-130)/(55-85) 115/65 (08/18 0447) SpO2:  [98 %-100 %] 100 % (08/18 0447) Weight:  [630 kg] 119 kg (08/17 1109) Last BM Date: 06/01/20  Intake/Output from previous day: 08/17 0701 - 08/18 0700 In: 1500 [I.V.:1500] Out: 470 [Urine:450; Blood:20] Intake/Output this shift: No intake/output data recorded.  General appearance: alert and cooperative Incision/Wound: c/d/i, and approp ttp Anti-infectives: Anti-infectives (From admission, onward)   Start     Dose/Rate Route Frequency Ordered Stop   06/01/20 0600  vancomycin (VANCOREADY) IVPB 1500 mg/300 mL        1,500 mg 150 mL/hr over 120 Minutes Intravenous On call to O.R. 05/31/20 0730 06/01/20 1349      Assessment/Plan: s/p Procedure(s): ROBOTIC TOUPET FUNDOPLICATION (N/A) await DG esoph and if OK will start clears [place] later today if doing well  LOS: 1 day    [name] 06/02/2020

## 2020-06-02 NOTE — Plan of Care (Signed)
Nutrition Education Note  RD consulted for nutrition education regarding pureed diet (x 2 weeks s/p nissen)  RD provided "National Dysphagia Diet Pureed Nutrition Therapy" handout from the Academy of Nutrition and Dietetics. Reviewed patient's dietary recall. Provided examples on ways to modify food textures to pureed consistency, including using a blender to puree foods and straining thicker soups and liquids.   RD discussed why it is important for patient to adhere to diet recommendations. Teach back method used.  Expect good compliance.  Body mass index is 44.34 kg/m. Pt meets criteria for extreme obesity, class III based on current BMI.  Current diet order is clear liquid, patient is consuming approximately 100% of meals at this time. Labs and medications reviewed. No further nutrition interventions warranted at this time. RD contact information provided. If additional nutrition issues arise, please re-consult RD.   Loistine Chance, RD, LDN, [place] Registered Dietitian II Certified Diabetes Care and Education Specialist Please refer to [facility] for RD and/or RD on-call/weekend/after hours pager

## 2020-06-02 NOTE — Discharge Summary (Signed)
Physician Discharge Summary  Patient ID: Krystal Harris MRN: [id number] DOB/AGE: 32-Apr-1987 32 y.o.  Admit date: 06/01/2020 Discharge date: 06/02/2020  Admission Diagnoses:chronic reflux  Discharge Diagnoses:  Status post toupee fundoplication  Discharged Condition: good  Hospital Course: Patient is a 34 year old female who comes in secondary to chronic reflux.  Patient underwent robotic Nissen fundoplication.  Please see operative note for details.  Patient did wellpostoperatively.  Patient had a esophagogram in postoperative day 1.patient was then started on a liquid diet.  She was advanced to.  Diet.  Patient had dietitian education.  Patient otherwise was ambulating well on her own and had minimal pain.  Patient was deemed stable for discharge and discharged home.  Consults: None  Significant Diagnostic Studies: radiology: no leak on esoph  Treatments: surgery: as above   Discharge Exam: Blood pressure 109/67, pulse 85, temperature 98.5 F (36.9 C), temperature source Oral, resp. rate 18, height 5' 4.5" (1.638 m), weight 119 kg, last menstrual period 05/07/2020, SpO2 100 %. General appearance: alert and cooperative GI: soft, non-tender; bowel sounds normal; no masses,  no organomegaly  Disposition: Discharge disposition: 01-Home or Self Care       Discharge Instructions    Diet - low sodium heart healthy   Complete by: As directed    Increase activity slowly   Complete by: As directed      Allergies as of 06/02/2020      Reactions   Penicillins Hives, Nausea And Vomiting   Has patient had a PCN reaction causing immediate rash, facial/tongue/throat swelling, SOB or lightheadedness with hypotension: no Has patient had a PCN reaction causing severe rash involving mucus membranes or skin necrosis: unknown Has patient had a PCN reaction that required hospitalization unknown Has patient had a PCN reaction occurring within the last 10 years: unknown If all of the  above answers are "NO", then may proceed with Cephalosporin use.      Medication List    TAKE these medications   acetaminophen 500 MG tablet Commonly known as: TYLENOL Take 1,000 mg by mouth every 6 (six) hours as needed for mild pain or headache.   ALPRAZolam 0.5 MG tablet Commonly known as: XANAX Take 1 tablet (0.5 mg total) by mouth 2 (two) times daily as needed for anxiety.   famotidine 40 MG tablet Commonly known as: Pepcid Take 1 tablet (40 mg total) by mouth 2 (two) times daily.   HYDROcodone-acetaminophen 7.5-325 mg/15 ml solution Commonly known as: HYCET Take 15 mLs by mouth 4 (four) times daily as needed for moderate pain.   pantoprazole 40 MG tablet Commonly known as: PROTONIX Take 1 tablet (40 mg total) by mouth daily.   sucralfate 1 g tablet Commonly known as: Carafate Take 1 tablet (1 g total) by mouth 4 (four) times daily -  with meals and at bedtime.   traZODone 100 MG tablet Commonly known as: DESYREL TAKE 1 TABLET BY MOUTH EVERYDAY AT BEDTIME What changed: See the new instructions.   venlafaxine 75 MG tablet Commonly known as: EFFEXOR Take 1 tablet (75 mg total) by mouth 2 (two) times daily.   Vitamin C 500 MG Caps Take 500 mg by mouth daily.   VITAMIN D3 PO Take 3,000 Units by mouth daily at 12 noon.       Follow-up Information    [name], MD. Schedule an appointment as soon as possible for a visit in 2 weeks.   Specialty: General Surgery Why: Post op visit Contact information: [address] [place] [address] [phone]               Signed: [name] 06/02/2020, 2:23 PM

## 2020-06-02 NOTE — Plan of Care (Signed)

## 2020-06-03 NOTE — Anesthesia Postprocedure Evaluation (Signed)
Anesthesia Post Note  Patient: Krystal Harris  Procedure(s) Performed: ROBOTIC TOUPET FUNDOPLICATION (N/A Abdomen)     Patient location during evaluation: PACU Anesthesia Type: General Level of consciousness: awake and alert Pain management: pain level controlled Vital Signs Assessment: post-procedure vital signs reviewed and stable Respiratory status: spontaneous breathing, nonlabored ventilation, respiratory function stable and patient connected to nasal cannula oxygen Cardiovascular status: blood pressure returned to baseline and stable Postop Assessment: no apparent nausea or vomiting Anesthetic complications: no   No complications documented.             Effie Berkshire

## 2020-06-23 ENCOUNTER — Emergency Department (INDEPENDENT_AMBULATORY_CARE_PROVIDER_SITE_OTHER): Payer: BC Managed Care – PPO

## 2020-06-23 ENCOUNTER — Other Ambulatory Visit: Payer: Self-pay

## 2020-06-23 ENCOUNTER — Emergency Department
Admission: RE | Admit: 2020-06-23 | Discharge: 2020-06-23 | Disposition: A | Discharge status: Home / Self Care | Payer: BC Managed Care – PPO | Source: Ambulatory Visit | Attending: Family Medicine | Admitting: Family Medicine

## 2020-06-23 VITALS — BP 133/83 | HR 99 | Temp 98.0°F | Resp 18

## 2020-06-23 DIAGNOSIS — R109 Unspecified abdominal pain: Secondary | ICD-10-CM | POA: Diagnosis not present

## 2020-06-23 DIAGNOSIS — R1033 Periumbilical pain: Secondary | ICD-10-CM | POA: Diagnosis not present

## 2020-06-23 NOTE — ED Provider Notes (Signed)
[facility]    CSN: [id number] Arrival date & time: 06/23/20  1750      History   Chief Complaint Chief Complaint  Patient presents with  . Appointment    6:00  . Abdominal Pain    Post-Surgical    HPI Krystal Harris is a 34 y.o. female.   Patient underwent a Nissen fundoplication on 0/07/62 with uneventful recovery.  Six days ago her diet was changed by her surgeon from pureed food to soft food.  During the past several days patient has developed abdominal pain and bloating after eating.  She feels as if something is "stuck" and she has had nausea without vomiting.  She denies fevers, chills, and sweats.  Her bowel movements have been irregular since her surgery but there has been no change. Patient has not been able to reach her surgeon for appropriate advice.  The history is provided by the patient.    Past Medical History:  Diagnosis Date  . Anemia   . Anxiety   . Chicken pox   . Depression   . Gallbladder problem   . GERD (gastroesophageal reflux disease)   . PONV (postoperative nausea and vomiting)    vomiting after surgery gallbladder removal 2016  . Vitamin D deficiency     Patient Active Problem List   Diagnosis Date Noted  . Status post robot-assisted surgical procedure 06/01/2020  . S/P Nissen fundoplication (without gastrostomy tube) procedure 06/01/2020  . GERD (gastroesophageal reflux disease) 05/01/2019  . Knee osteoarthritis 09/12/2017  . Anxiety 04/11/2017  . Insomnia 04/11/2017  . Fatty liver 09/27/2016    Past Surgical History:  Procedure Laterality Date  . BRAVO [place] STUDY N/A 01/22/2020   Procedure: BRAVO [place];  Surgeon: Milus Banister, MD;  [facility];  Service: Endoscopy;  Laterality: N/A;  . CHOLECYSTECTOMY     laprascopic  . CYST EXCISION Right 12/29/2016   Procedure: EXCISION OF RIGHT AXILLARY CYST;  Surgeon: [name], MD;  Location: [facility];  Service: General;  Laterality: Right;  .  ESOPHAGOGASTRODUODENOSCOPY (EGD) WITH PROPOFOL N/A 01/22/2020   Procedure: ESOPHAGOGASTRODUODENOSCOPY (EGD) WITH PROPOFOL;  Surgeon: Milus Banister, MD;  [facility];  Service: Endoscopy;  Laterality: N/A;  . FOOT SURGERY  02/2015   scar tissue and keloid scar removal  . FOREIGN BODY REMOVAL Right 09/17/2013   Procedure: REMOVAL FOREIGN BODY NEEDLE PLANTAR ASPECT OF THE FOOT;  Surgeon: Newt Minion, MD;  Location: [place];  Service: Orthopedics;  Laterality: Right;  Remove Foreign Body Right Foot  . WISDOM TOOTH EXTRACTION      OB History    Gravida  0   Para  0   Term  0   Preterm  0   AB  0   Living  0     SAB  0   TAB  0   Ectopic  0   Multiple  0   Live Births  0            Home Medications    Prior to Admission medications   Medication Sig Start Date End Date Taking? Authorizing Provider  acetaminophen (TYLENOL) 500 MG tablet Take 1,000 mg by mouth every 6 (six) hours as needed for mild pain or headache.    [provider]  ALPRAZolam Duanne Moron) 0.5 MG tablet Take 1 tablet (0.5 mg total) by mouth 2 (two) times daily as needed for anxiety. 11/27/19   [name], MD  Ascorbic Acid (  VITAMIN C) 500 MG CAPS Take 500 mg by mouth daily.     [provider]  Cholecalciferol (VITAMIN D3 PO) Take 3,000 Units by mouth daily at 12 noon.     [provider]  famotidine (PEPCID) 40 MG tablet Take 1 tablet (40 mg total) by mouth 2 (two) times daily. 09/19/19   Levin Erp, PA  HYDROcodone-acetaminophen (HYCET) 7.5-325 mg/15 ml solution Take 15 mLs by mouth 4 (four) times daily as needed for moderate pain. 06/02/20 06/02/21  [name], MD  pantoprazole (PROTONIX) 40 MG tablet Take 1 tablet (40 mg total) by mouth daily. 03/12/20   [name], MD  sucralfate (CARAFATE) 1 g tablet Take 1 tablet (1 g total) by mouth 4 (four) times daily -  with meals and at bedtime. 03/12/20   [name], MD  traZODone  (DESYREL) 100 MG tablet TAKE 1 TABLET BY MOUTH EVERYDAY AT BEDTIME Patient taking differently: Take 100 mg by mouth at bedtime.  05/03/20   [name], MD  venlafaxine (EFFEXOR) 75 MG tablet Take 1 tablet (75 mg total) by mouth 2 (two) times daily. 05/07/20   [name], MD    Family History Family History  Problem Relation Age of Onset  . Diabetes Mother   . Hypertension Mother   . Thyroid disease Mother   . Obesity Mother   . Stroke Maternal Grandmother   . Diabetes Maternal Grandfather   . Stroke Maternal Grandfather   . Hypertension Maternal Grandfather   . Lung cancer Maternal Grandfather   . Stomach cancer Neg Hx   . Colon cancer Neg Hx   . Rectal cancer Neg Hx   . Pancreatic cancer Neg Hx     Social History Social History   Tobacco Use  . Smoking status: Never Smoker  . Smokeless tobacco: Never Used  Vaping Use  . Vaping Use: Never used  Substance Use Topics  . Alcohol use: Yes    Comment: occasionally  . Drug use: No     Allergies   Penicillins   Review of Systems Review of Systems  Constitutional: Positive for appetite change. Negative for activity change, chills, diaphoresis, fatigue and fever.  HENT: Negative.   Eyes: Negative.   Respiratory: Negative.   Cardiovascular: Negative.   Gastrointestinal: Positive for abdominal distention, abdominal pain and nausea. Negative for blood in stool, constipation, diarrhea and vomiting.  Genitourinary: Negative.   Musculoskeletal: Negative.   Skin: Negative.   Neurological: Negative for dizziness, light-headedness and headaches.     Physical Exam Triage Vital Signs ED Triage Vitals  Enc Vitals Group     BP 06/23/20 1842 133/83     Pulse Rate 06/23/20 1842 99     Resp 06/23/20 1842 18     Temp 06/23/20 1842 98 F (36.7 C)     Temp Source 06/23/20 1842 Oral     SpO2 06/23/20 1842 99 %     Weight --      Height --      Head Circumference --      Peak Flow --      Pain Score 06/23/20  1839 8     Pain Loc --      Pain Edu? --      Excl. in [place]? --    No data found.  Updated Vital Signs BP 133/83 (BP Location: Right Arm)   Pulse 99   Temp 98 F (36.7 C) (Oral)   Resp 18  SpO2 99%   Visual Acuity Right Eye Distance:   Left Eye Distance:   Bilateral Distance:    Right Eye Near:   Left Eye Near:    Bilateral Near:     Physical Exam Vitals and nursing note reviewed.  Constitutional:      General: She is not in acute distress.    Appearance: She is obese.  HENT:     Head: Normocephalic.     Nose: Nose normal.     Mouth/Throat:     Mouth: Mucous membranes are moist.     Pharynx: Oropharynx is clear.  Eyes:     Conjunctiva/sclera: Conjunctivae normal.     Pupils: Pupils are equal, round, and reactive to light.  Cardiovascular:     Rate and Rhythm: Normal rate and regular rhythm.     Heart sounds: Normal heart sounds.  Pulmonary:     Breath sounds: Normal breath sounds.  Abdominal:     General: Bowel sounds are normal. There is no distension.     Palpations: Abdomen is soft. There is no hepatomegaly, splenomegaly or mass.     Tenderness: There is abdominal tenderness in the epigastric area and periumbilical area. There is no right CVA tenderness, left CVA tenderness or guarding.    Musculoskeletal:     Cervical back: Neck supple.     Right lower leg: No edema.     Left lower leg: No edema.  Lymphadenopathy:     Cervical: No cervical adenopathy.  Skin:    General: Skin is warm and dry.     Findings: No rash.  Neurological:     General: No focal deficit present.     Mental Status: She is alert.      UC Treatments / Results  Labs (all labs ordered are listed, but only abnormal results are displayed) Labs Reviewed - No data to display  EKG   Radiology No results found.  Procedures Procedures (including critical care time)  Medications Ordered in UC Medications - No data to display  Initial Impression / Assessment and Plan / UC  Course  I have reviewed the triage vital signs and the nursing notes.  Pertinent labs & imaging results that were available during my care of the patient were reviewed by me and considered in my medical decision making (see chart for details).    Normal bowel gas pattern on KUB reassuring. Recommend contacting her surgeon for further instructions.  Final Clinical Impressions(s) / UC Diagnoses   Final diagnoses:  Abdominal pain  Periumbilical abdominal pain     Discharge Instructions     Begin clear liquids for 12 hours, then gradually advance diet.  If symptoms become significantly worse during the night or over the weekend, proceed to the local emergency room.     ED Prescriptions    None        Kandra Nicolas, MD 06/26/20 1243

## 2020-06-23 NOTE — Discharge Instructions (Addendum)
Begin clear liquids for 12 hours, then gradually advance diet.  If symptoms become significantly worse during the night or over the weekend, proceed to the local emergency room.

## 2020-06-23 NOTE — ED Triage Notes (Signed)
Patient presents to Urgent Care with complaints of abdominal pain (mid) since transitioning from pureed foods to soft foods earlier today since having abdominal surgery last month. Patient reports it feels like something feels like it is stuck. Pt has taken tylenol and advil today for her pain but it did not help.

## 2020-07-09 ENCOUNTER — Other Ambulatory Visit (HOSPITAL_COMMUNITY): Payer: Self-pay | Admitting: General Surgery

## 2020-07-09 ENCOUNTER — Ambulatory Visit (HOSPITAL_COMMUNITY)
Admission: RE | Admit: 2020-07-09 | Discharge: 2020-07-09 | Disposition: A | Discharge status: Home / Self Care | Payer: BC Managed Care – PPO | Source: Ambulatory Visit | Attending: General Surgery | Admitting: General Surgery

## 2020-07-09 DIAGNOSIS — R110 Nausea: Secondary | ICD-10-CM

## 2020-07-09 DIAGNOSIS — Z9889 Other specified postprocedural states: Secondary | ICD-10-CM

## 2020-07-09 DIAGNOSIS — R1110 Vomiting, unspecified: Secondary | ICD-10-CM

## 2020-07-13 ENCOUNTER — Telehealth: Payer: Self-pay | Admitting: Gastroenterology

## 2020-07-13 NOTE — Telephone Encounter (Signed)
Pt is requesting a call back from a nurse to discuss the EGD that she needs. Pt states this EGD is different as some "balloons" need to be used to stretch out her esophagus

## 2020-07-13 NOTE — Telephone Encounter (Signed)
The pt was seen by [facility] for care 04/2020.  She transferred care and would like to transfer back to Dr Ardis Hughs.  She will have the records faxed for review by Dr Ardis Hughs to see if he will accept her back as a patient with the reason for transfer.  She states she was told she needs some "special EGD"

## 2020-07-15 ENCOUNTER — Ambulatory Visit: Payer: Self-pay | Admitting: General Surgery

## 2020-07-17 ENCOUNTER — Other Ambulatory Visit (HOSPITAL_COMMUNITY): Payer: BC Managed Care – PPO

## 2020-07-19 ENCOUNTER — Encounter (HOSPITAL_COMMUNITY): Payer: Self-pay

## 2020-07-19 ENCOUNTER — Ambulatory Visit (HOSPITAL_COMMUNITY): Admit: 2020-07-19 | Payer: BC Managed Care – PPO | Admitting: General Surgery

## 2020-07-19 SURGERY — EGD (ESOPHAGOGASTRODUODENOSCOPY)
Anesthesia: Choice

## 2020-07-21 NOTE — Patient Instructions (Addendum)
DUE TO COVID-19 ONLY ONE VISITOR IS ALLOWED TO COME WITH YOU AND STAY IN THE WAITING ROOM ONLY DURING PRE OP AND PROCEDURE DAY OF SURGERY. THE 1 VISITOR  MAY VISIT WITH YOU AFTER SURGERY IN YOUR PRIVATE ROOM DURING VISITING HOURS ONLY!  YOU NEED TO HAVE A COVID 19 TEST ON_10/16______ @_9 :30______, THIS TEST MUST BE DONE BEFORE SURGERY,  COVID TESTING SITE [place] [id number], IT IS ON THE RIGHT GOING OUT [address] APPROXIMATELY  2 MINUTES PAST [employer] ON THE RIGHT. ONCE YOUR COVID TEST IS COMPLETED,  PLEASE BEGIN THE QUARANTINE INSTRUCTIONS AS OUTLINED IN YOUR HANDOUT.                Krystal Harris    Your procedure is scheduled on: 08/04/20   Report to [facility] Main  Entrance   Report to admitting at    10:05 AM     Call this number if you have problems the morning of surgery [phone]    Remember: Do not eat food or drink liquids :After Midnight  . BRUSH YOUR TEETH MORNING OF SURGERY AND RINSE YOUR MOUTH OUT, NO CHEWING GUM CANDY OR MINTS.     Take these medicines the morning of surgery with A SIP OF WATER: Xanax if needed                                 You may not have any metal on your body including hair pins and              piercings  Do not wear jewelry, make-up, lotions, powders or perfumes, deodorant             Do not wear nail polish on your fingernails.  Do not shave  48 hours prior to surgery.             Do not bring valuables to the hospital. [place].  Contacts, dentures or bridgework may not be worn into surgery.      Patients discharged the day of surgery will not be allowed to drive home.   IF YOU ARE HAVING SURGERY AND GOING HOME THE SAME DAY, YOU MUST HAVE AN ADULT TO DRIVE YOU HOME AND BE WITH YOU FOR 24 HOURS.   YOU MAY GO HOME BY TAXI OR UBER OR ORTHERWISE, BUT AN ADULT MUST ACCOMPANY YOU HOME AND STAY WITH YOU FOR 24 HOURS.  Name and phone  number of your driver:  Special Instructions: N/A              Please read over the following fact sheets you were given: _____________________________________________________________________             [facility] - Preparing for Surgery Before surgery, you can play an important role.   Because skin is not sterile, your skin needs to be as free of germs as possible.   You can reduce the number of germs on your skin by washing with CHG (chlorahexidine gluconate) soap before surgery.   CHG is an antiseptic cleaner which kills germs and bonds with the skin to continue killing germs even after washing. Please DO NOT use if you have an allergy to CHG or antibacterial soaps.   If your skin becomes reddened/irritated stop using the CHG and  inform your nurse when you arrive at Short Stay. Do not shave (including legs and underarms) for at least 48 hours prior to the first CHG shower.  Please follow these instructions carefully:  1.  Shower with CHG Soap the night before surgery and the  morning of Surgery.  2.  If you choose to wash your hair, wash your hair first as usual with your  normal  shampoo.  3.  After you shampoo, rinse your hair and body thoroughly to remove the  shampoo.                                        4.  Use CHG as you would any other liquid soap.  You can apply chg directly  to the skin and wash                       Gently with a scrungie or clean washcloth.  5.  Apply the CHG Soap to your body ONLY FROM THE NECK DOWN.   Do not use on face/ open                           Wound or open sores. Avoid contact with eyes, ears mouth and genitals (private parts).                       Wash face,  Genitals (private parts) with your normal soap.             6.  Wash thoroughly, paying special attention to the area where your surgery  will be performed.  7.  Thoroughly rinse your body with warm water from the neck down.  8.  DO NOT shower/wash with your normal soap after using and  rinsing off  the CHG Soap.             9.  Pat yourself dry with a clean towel.            10.  Wear clean pajamas.            11.  Place clean sheets on your bed the night of your first shower and do not  sleep with pets. Day of Surgery : Do not apply any lotions/deodorants the morning of surgery.  Please wear clean clothes to the hospital/surgery center.  FAILURE TO FOLLOW THESE INSTRUCTIONS MAY RESULT IN THE CANCELLATION OF YOUR SURGERY PATIENT SIGNATURE_________________________________  NURSE SIGNATURE__________________________________  ________________________________________________________________________

## 2020-07-22 ENCOUNTER — Encounter (HOSPITAL_COMMUNITY): Payer: Self-pay

## 2020-07-22 ENCOUNTER — Encounter (HOSPITAL_COMMUNITY)
Admission: RE | Admit: 2020-07-22 | Discharge: 2020-07-22 | Disposition: A | Discharge status: Home / Self Care | Payer: BC Managed Care – PPO | Source: Ambulatory Visit | Attending: General Surgery | Admitting: General Surgery

## 2020-07-22 ENCOUNTER — Other Ambulatory Visit: Payer: Self-pay

## 2020-07-22 DIAGNOSIS — Z01812 Encounter for preprocedural laboratory examination: Secondary | ICD-10-CM | POA: Diagnosis not present

## 2020-07-22 LAB — CBC
HCT: 36.6 % (ref 36.0–46.0)
Hemoglobin: 11.4 g/dL — ABNORMAL LOW (ref 12.0–15.0)
MCH: 27.5 pg (ref 26.0–34.0)
MCHC: 31.1 g/dL (ref 30.0–36.0)
MCV: 88.4 fL (ref 80.0–100.0)
Platelets: 355 10*3/uL (ref 150–400)
RBC: 4.14 MIL/uL (ref 3.87–5.11)
RDW: 14 % (ref 11.5–15.5)
WBC: 5.7 10*3/uL (ref 4.0–10.5)
nRBC: 0 % (ref 0.0–0.2)

## 2020-07-22 NOTE — Progress Notes (Signed)
COVID Vaccine Completed:Yes Date COVID Vaccine completed:12/29/19/, booster 07/17/20 COVID vaccine manufacturer: Pfizer    PCP - Dr. Raliegh Ip. [place] Cardiologist - no  Chest x-ray - 09/29/19 EKG - 10/01/19 Stress Test - no ECHO - no Cardiac Cath - no Pacemaker/ICD device last checked:NA  Sleep Study - yes- negative findings CPAP - no  Fasting Blood Sugar - NA Checks Blood Sugar _____ times a day  Blood Thinner Instructions:NA Aspirin Instructions: Last Dose:  Anesthesia review:   Patient denies shortness of breath, fever, cough and chest pain at PAT appointment yes   Patient verbalized understanding of instructions that were given to them at the PAT appointment. Patient was also instructed that they will need to review over the PAT instructions again at home before surgery. Yes   Pt has a BMI of 43.2 but has no SOB climbing stairs, doing housework or with ADLs

## 2020-07-26 ENCOUNTER — Other Ambulatory Visit (HOSPITAL_COMMUNITY): Payer: BC Managed Care – PPO

## 2020-07-31 ENCOUNTER — Other Ambulatory Visit (HOSPITAL_COMMUNITY)
Admission: RE | Admit: 2020-07-31 | Discharge: 2020-07-31 | Disposition: A | Discharge status: Home / Self Care | Payer: BC Managed Care – PPO | Source: Ambulatory Visit | Attending: General Surgery | Admitting: General Surgery

## 2020-07-31 DIAGNOSIS — Z01812 Encounter for preprocedural laboratory examination: Secondary | ICD-10-CM | POA: Diagnosis present

## 2020-07-31 DIAGNOSIS — Z20822 Contact with and (suspected) exposure to covid-19: Secondary | ICD-10-CM | POA: Diagnosis not present

## 2020-08-01 LAB — SARS CORONAVIRUS 2 (TAT 6-24 HRS): SARS Coronavirus 2: NEGATIVE

## 2020-08-04 ENCOUNTER — Other Ambulatory Visit: Payer: Self-pay

## 2020-08-04 ENCOUNTER — Ambulatory Visit (HOSPITAL_COMMUNITY): Payer: BC Managed Care – PPO | Admitting: Anesthesiology

## 2020-08-04 ENCOUNTER — Encounter (HOSPITAL_COMMUNITY)
Admission: RE | Disposition: A | Discharge status: Home / Self Care | Payer: Self-pay | Source: Ambulatory Visit | Attending: General Surgery

## 2020-08-04 ENCOUNTER — Encounter (HOSPITAL_COMMUNITY): Payer: Self-pay | Admitting: General Surgery

## 2020-08-04 ENCOUNTER — Ambulatory Visit (HOSPITAL_COMMUNITY)
Admission: RE | Admit: 2020-08-04 | Discharge: 2020-08-04 | Disposition: A | Discharge status: Home / Self Care | Payer: BC Managed Care – PPO | Source: Ambulatory Visit | Attending: General Surgery | Admitting: General Surgery

## 2020-08-04 DIAGNOSIS — Z8719 Personal history of other diseases of the digestive system: Secondary | ICD-10-CM | POA: Diagnosis not present

## 2020-08-04 DIAGNOSIS — E559 Vitamin D deficiency, unspecified: Secondary | ICD-10-CM | POA: Diagnosis not present

## 2020-08-04 DIAGNOSIS — Z79899 Other long term (current) drug therapy: Secondary | ICD-10-CM | POA: Diagnosis not present

## 2020-08-04 DIAGNOSIS — K219 Gastro-esophageal reflux disease without esophagitis: Secondary | ICD-10-CM | POA: Insufficient documentation

## 2020-08-04 DIAGNOSIS — M1990 Unspecified osteoarthritis, unspecified site: Secondary | ICD-10-CM | POA: Diagnosis not present

## 2020-08-04 DIAGNOSIS — Z9889 Other specified postprocedural states: Secondary | ICD-10-CM | POA: Insufficient documentation

## 2020-08-04 DIAGNOSIS — R1310 Dysphagia, unspecified: Secondary | ICD-10-CM | POA: Insufficient documentation

## 2020-08-04 DIAGNOSIS — F329 Major depressive disorder, single episode, unspecified: Secondary | ICD-10-CM | POA: Diagnosis not present

## 2020-08-04 DIAGNOSIS — F419 Anxiety disorder, unspecified: Secondary | ICD-10-CM | POA: Insufficient documentation

## 2020-08-04 HISTORY — PX: ESOPHAGOGASTRODUODENOSCOPY: SHX5428

## 2020-08-04 LAB — PREGNANCY, URINE: Preg Test, Ur: NEGATIVE

## 2020-08-04 SURGERY — EGD (ESOPHAGOGASTRODUODENOSCOPY)
Anesthesia: Monitor Anesthesia Care

## 2020-08-04 MED ORDER — ORAL CARE MOUTH RINSE: 15.0000 mL | Freq: Once | OROMUCOSAL | Status: AC

## 2020-08-04 MED ORDER — PROPOFOL 10 MG/ML IV BOLUS
INTRAVENOUS | Status: AC
  Filled 2020-08-04: qty 20

## 2020-08-04 MED ORDER — PROPOFOL 500 MG/50ML IV EMUL
INTRAVENOUS | Status: DC | PRN
  Administered 2020-08-04: 50 mg via INTRAVENOUS
  Administered 2020-08-04: 30 mg via INTRAVENOUS
  Administered 2020-08-04: 50 mg via INTRAVENOUS
  Administered 2020-08-04: 40 mg via INTRAVENOUS
  Administered 2020-08-04: 30 mg via INTRAVENOUS
  Administered 2020-08-04: 80 mg via INTRAVENOUS
  Administered 2020-08-04: 50 mg via INTRAVENOUS
  Administered 2020-08-04: 40 mg via INTRAVENOUS

## 2020-08-04 MED ORDER — CHLORHEXIDINE GLUCONATE 0.12 % MT SOLN
15.0000 mL | Freq: Once | OROMUCOSAL | Status: AC
  Administered 2020-08-04: 15 mL via OROMUCOSAL

## 2020-08-04 MED ORDER — LIDOCAINE 2% (20 MG/ML) 5 ML SYRINGE
INTRAMUSCULAR | Status: AC
  Filled 2020-08-04: qty 5

## 2020-08-04 MED ORDER — TRIAMCINOLONE ACETONIDE 40 MG/ML IJ SUSP
INTRAMUSCULAR | Status: AC
  Filled 2020-08-04: qty 1

## 2020-08-04 MED ORDER — SODIUM CHLORIDE (PF) 0.9 % IJ SOLN
INTRAMUSCULAR | Status: DC | PRN
  Administered 2020-08-04: 4 mL

## 2020-08-04 MED ORDER — CHLORHEXIDINE GLUCONATE CLOTH 2 % EX PADS
6.0000 | MEDICATED_PAD | Freq: Once | CUTANEOUS | Status: AC
  Administered 2020-08-04: 6 via TOPICAL

## 2020-08-04 MED ORDER — FENTANYL CITRATE (PF) 100 MCG/2ML IJ SOLN
INTRAMUSCULAR | Status: DC | PRN
  Administered 2020-08-04: 50 ug via INTRAVENOUS

## 2020-08-04 MED ORDER — LACTATED RINGERS IV SOLN: INTRAVENOUS | Status: DC

## 2020-08-04 MED ORDER — FENTANYL CITRATE (PF) 100 MCG/2ML IJ SOLN
INTRAMUSCULAR | Status: AC
  Filled 2020-08-04: qty 2

## 2020-08-04 MED ORDER — TRIAMCINOLONE ACETONIDE 40 MG/ML IJ SUSP
40.0000 mg | Freq: Once | INTRAMUSCULAR | Status: AC
  Administered 2020-08-04: 40 mg via INTRAMUSCULAR

## 2020-08-04 NOTE — Anesthesia Preprocedure Evaluation (Signed)
Anesthesia Evaluation  Patient identified by MRN, date of birth, ID band Patient awake    Reviewed: Allergy & Precautions, NPO status , Patient's Chart, lab work & pertinent test results  History of Anesthesia Complications (+) PONV and history of anesthetic complications  Airway Mallampati: I  TM Distance: >3 FB Neck ROM: Full    Dental  (+) Teeth Intact, Dental Advisory Given   Pulmonary    breath sounds clear to auscultation       Cardiovascular  Rhythm:Regular Rate:Normal     Neuro/Psych PSYCHIATRIC DISORDERS Anxiety Depression    GI/Hepatic Neg liver ROS, GERD  Medicated,  Endo/Other  negative endocrine ROS  Renal/GU negative Renal ROS     Musculoskeletal  (+) Arthritis ,   Abdominal (+) + obese,   Peds  Hematology  (+) Blood dyscrasia, anemia ,   Anesthesia Other Findings   Reproductive/Obstetrics                            Anesthesia Physical  Anesthesia Plan  ASA: III  Anesthesia Plan: MAC   Post-op Pain Management:    Induction: Intravenous  PONV Risk Score and Plan: 3 and Ondansetron, Dexamethasone, Propofol infusion, TIVA and Treatment may vary due to age or medical condition  Airway Management Planned:   Additional Equipment: None  Intra-op Plan:   Post-operative Plan:   Informed Consent: I have reviewed the patients History and Physical, chart, labs and discussed the procedure including the risks, benefits and alternatives for the proposed anesthesia with the patient or authorized representative who has indicated his/her understanding and acceptance.     Dental advisory given  Plan Discussed with: CRNA  Anesthesia Plan Comments:       Anesthesia Quick Evaluation

## 2020-08-04 NOTE — H&P (Signed)
Krystal Harris is an 34 y.o. female.   Chief Complaint: Dysphagia HPI: Pt is 60mo s/p hhr and toupet fundoplication.  She has had some dysphagia and does not appear to be resolving.Esophagram shows no slip of the toupet or leakage.  Past Medical History:  Diagnosis Date  . Anemia   . Anxiety   . Chicken pox   . Depression   . Gallbladder problem   . GERD (gastroesophageal reflux disease)   . PONV (postoperative nausea and vomiting)    vomiting after surgery gallbladder removal 2016  . Vitamin D deficiency     Past Surgical History:  Procedure Laterality Date  . BRAVO [place] STUDY N/A 01/22/2020   Procedure: BRAVO [place];  Surgeon: Milus Banister, MD;  [facility];  Service: Endoscopy;  Laterality: N/A;  . CHOLECYSTECTOMY     laprascopic  . CYST EXCISION Right 12/29/2016   Procedure: EXCISION OF RIGHT AXILLARY CYST;  Surgeon: [name], MD;  [facility];  Service: General;  Laterality: Right;  . ESOPHAGOGASTRODUODENOSCOPY (EGD) WITH PROPOFOL N/A 01/22/2020   Procedure: ESOPHAGOGASTRODUODENOSCOPY (EGD) WITH PROPOFOL;  Surgeon: Milus Banister, MD;  [facility];  Service: Endoscopy;  Laterality: N/A;  . FOOT SURGERY  02/2015   scar tissue and keloid scar removal  . FOREIGN BODY REMOVAL Right 09/17/2013   Procedure: REMOVAL FOREIGN BODY NEEDLE PLANTAR ASPECT OF THE FOOT;  Surgeon: Newt Minion, MD;  Location: [place];  Service: Orthopedics;  Laterality: Right;  Remove Foreign Body Right Foot  . LAPAROSCOPIC NISSEN FUNDOPLICATION  32/20/2542  . WISDOM TOOTH EXTRACTION      Family History  Problem Relation Age of Onset  . Diabetes Mother   . Hypertension Mother   . Thyroid disease Mother   . Obesity Mother   . Stroke Maternal Grandmother   . Diabetes Maternal Grandfather   . Stroke Maternal Grandfather   . Hypertension Maternal Grandfather   . Lung cancer Maternal Grandfather   . Stomach cancer Neg Hx   . Colon cancer Neg Hx   . Rectal cancer Neg  Hx   . Pancreatic cancer Neg Hx    Social History:  reports that she has never smoked. She has never used smokeless tobacco. She reports current alcohol use. She reports that she does not use drugs.  Allergies:  Allergies  Allergen Reactions  . Penicillins Hives and Nausea And Vomiting    Has patient had a PCN reaction causing immediate rash, facial/tongue/throat swelling, SOB or lightheadedness with hypotension: no Has patient had a PCN reaction causing severe rash involving mucus membranes or skin necrosis: unknown Has patient had a PCN reaction that required hospitalization unknown Has patient had a PCN reaction occurring within the last 10 years: unknown If all of the above answers are "NO", then may proceed with Cephalosporin use.     Medications Prior to Admission  Medication Sig Dispense Refill  . acetaminophen (TYLENOL) 500 MG tablet Take 1,000 mg by mouth every 6 (six) hours as needed for mild pain or headache.    . ALPRAZolam (XANAX) 0.5 MG tablet Take 1 tablet (0.5 mg total) by mouth 2 (two) times daily as needed for anxiety. 30 tablet 1  . Ascorbic Acid (VITAMIN C) 500 MG CAPS Take 500 mg by mouth daily.     . Cholecalciferol (VITAMIN D3 PO) Take 3,000 Units by mouth daily at 12 noon.     . traZODone (DESYREL) 100 MG tablet TAKE 1 TABLET BY MOUTH EVERYDAY  AT BEDTIME (Patient taking differently: Take 100 mg by mouth at bedtime. ) 30 tablet 3  . venlafaxine XR (EFFEXOR-XR) 75 MG 24 hr capsule Take 75 mg by mouth daily with breakfast.    . famotidine (PEPCID) 40 MG tablet Take 1 tablet (40 mg total) by mouth 2 (two) times daily. (Patient not taking: Reported on 07/21/2020) 60 tablet 3  . HYDROcodone-acetaminophen (HYCET) 7.5-325 mg/15 ml solution Take 15 mLs by mouth 4 (four) times daily as needed for moderate pain. (Patient not taking: Reported on 07/21/2020) 120 mL 0  . pantoprazole (PROTONIX) 40 MG tablet Take 1 tablet (40 mg total) by mouth daily. (Patient not taking: Reported  on 07/21/2020) 30 tablet 3  . sucralfate (CARAFATE) 1 g tablet Take 1 tablet (1 g total) by mouth 4 (four) times daily -  with meals and at bedtime. (Patient not taking: Reported on 07/21/2020) 90 tablet 1  . venlafaxine (EFFEXOR) 75 MG tablet Take 1 tablet (75 mg total) by mouth 2 (two) times daily. (Patient not taking: Reported on 07/21/2020) 60 tablet 1    Results for orders placed or performed during the hospital encounter of 08/04/20 (from the past 48 hour(s))  Pregnancy, urine per protocol     Status: None   Collection Time: 08/04/20 10:29 AM  Result Value Ref Range   Preg Test, Ur NEGATIVE NEGATIVE    Comment:        THE SENSITIVITY OF THIS METHODOLOGY IS >20 mIU/mL. Performed at [facility], [place] [address], [place], [place] [address]    No results found.  Review of Systems  Constitutional: Negative for chills and fever.  HENT: Negative for ear discharge, hearing loss and sore throat.   Eyes: Negative for discharge.  Respiratory: Negative for cough and shortness of breath.   Cardiovascular: Negative for chest pain and leg swelling.  Gastrointestinal: Negative for abdominal pain, constipation, diarrhea, nausea and vomiting.  Musculoskeletal: Negative for myalgias and neck pain.  Skin: Negative for rash.  Allergic/Immunologic: Negative for environmental allergies.  Neurological: Negative for dizziness and seizures.  Hematological: Does not bruise/bleed easily.  Psychiatric/Behavioral: Negative for suicidal ideas.  All other systems reviewed and are negative.   Blood pressure 131/89, pulse (!) 103, temperature 98.4 F (36.9 C), temperature source Oral, resp. rate 18, SpO2 97 %. Physical Exam Constitutional:      Appearance: She is well-developed.     Comments: Conversant No acute distress  Eyes:     General: Lids are normal. No scleral icterus.    Comments: Pupils are equal round and reactive No lid lag Moist conjunctiva  Neck:     Thyroid: No  thyromegaly.     Trachea: No tracheal tenderness.     Comments: No cervical lymphadenopathy Cardiovascular:     Rate and Rhythm: Normal rate and regular rhythm.     Heart sounds: No murmur heard.   Pulmonary:     Effort: Pulmonary effort is normal.     Breath sounds: Normal breath sounds. No wheezing or rales.  Abdominal:     Tenderness: There is no abdominal tenderness.     Hernia: No hernia is present.  Skin:    General: Skin is warm.     Findings: No rash.     Nails: There is no clubbing.     Comments: Normal skin turgor  Neurological:     Mental Status: She is alert and oriented to person, place, and time.     Comments: Normal gait and  station  Psychiatric:        Judgment: Judgment normal.     Comments: Appropriate affect      Assessment/Plan 29F s/p toupet fundo and dysphagia 1. Plan to take to OR for EGD and balloon dilatation. 2. I d/w with her the procedure and risks of benefits of the procedure to include, but not limited to: infection, bleeding, damage to the esophagus, possible perforation.  Pt voiced understanding and wishes to proceed.  [name], MD 08/04/2020, 11:19 AM

## 2020-08-04 NOTE — Transfer of Care (Signed)
Immediate Anesthesia Transfer of Care Note  Patient: Krystal Harris  Procedure(s) Performed: Procedure(s): ESOPHAGOGASTRODUODENOSCOPY (EGD) with balloon dilation (N/A)  Patient Location: PACU   Anesthesia Type:MAC  Level of Consciousness: awake, alert  and oriented  Airway & Oxygen Therapy: Patient Spontanous Breathing and Patient connected to nasal cannula oxygen  Post-op Assessment: Report given to RN and Post -op Vital signs reviewed and stable  Post vital signs: Reviewed and stable  Last Vitals:  Vitals:   08/04/20 1041  BP: 131/89  Pulse: (!) 103  Resp: 18  Temp: 36.9 C  SpO2: 50%    Complications: No apparent anesthesia complications

## 2020-08-04 NOTE — Discharge Instructions (Signed)
Upper Endoscopy, Adult, Care After This sheet gives you information about how to care for yourself after your procedure. Your health care provider may also give you more specific instructions. If you have problems or questions, contact your health care provider. What can I expect after the procedure? After the procedure, it is common to have:  A sore throat.  Mild stomach pain or discomfort.  Bloating.  Nausea. Follow these instructions at home:   Follow instructions from your health care provider about what to eat or drink after your procedure.  Return to your normal activities as told by your health care provider. Ask your health care provider what activities are safe for you.  Take over-the-counter and prescription medicines only as told by your health care provider.  Do not drive for 24 hours if you were given a sedative during your procedure.  Keep all follow-up visits as told by your health care provider. This is important. Contact a health care provider if you have:  A sore throat that lasts longer than one day.  Trouble swallowing. Get help right away if:  You vomit blood or your vomit looks like coffee grounds.  You have: ? A fever. ? Bloody, black, or tarry stools. ? A severe sore throat or you cannot swallow. ? Difficulty breathing. ? Severe pain in your chest or abdomen. Summary  After the procedure, it is common to have a sore throat, mild stomach discomfort, bloating, and nausea.  Do not drive for 24 hours if you were given a sedative during the procedure.  Follow instructions from your health care provider about what to eat or drink after your procedure.  Return to your normal activities as told by your health care provider.  Per Dr. Rosendo Gros, pt can stop her famotidine, pantoprazole, and sucralfate.   This information is not intended to replace advice given to you by your health care provider. Make sure you discuss any questions you have with your  health care provider. Document Revised: 03/26/2018 Document Reviewed: 03/04/2018 Elsevier Patient Education  [place].

## 2020-08-04 NOTE — Op Note (Signed)
08/04/2020  12:43 PM  PATIENT:  Krystal Harris  34 y.o. female  PRE-OPERATIVE DIAGNOSIS:  dysphagia  POST-OPERATIVE DIAGNOSIS:  dysphagia  PROCEDURE:  Procedure(s): ESOPHAGOGASTRODUODENOSCOPY (EGD) with balloon dilation (N/A) and Kenalog injection  SURGEON:  Surgeon(s) and Role:    * [name], MD - Primary    Michael Boston, MD  ANESTHESIA:   IV sedation  EBL:  minimal   BLOOD ADMINISTERED:none  DRAINS: none   LOCAL MEDICATIONS USED:  NONE  SPECIMEN:  No Specimen  DISPOSITION OF SPECIMEN:  N/A  COUNTS:  YES  TOURNIQUET:  * No tourniquets in log *  DICTATION: .Dragon Dictation Patient is a 34 year old female, who underwent a toupee fundoplication for reflux.  Patient is currently 2 months postop.  She has had some dysphagia throughout her recovery.  Secondary to continued with dysphagia we proceeded with EGD and balloon dilatation.  Findings: Area at the GE junction was approximately 40 cm appeared not to be strictured.  The area was balloon dilated from 15 to 18 mm.  Kenalog was injected in all 4 quadrants,  Around the GE junction.   Details procedure: After the patient was consented she was taken back to the OR placed in the supine position with bilateral SCDs in place.  Patient underwent moderate sedation.  Patient was then placed in the lateral position.  The scope was introduced into the mouth and down through the esophagus without difficulty.  The GE junction was seen at approximately 4 cm.  The scope was able to traverse the GE junction and into the stomach without any difficulty.  The scope was retroflexed.  The toupee wrap could easily be seen posteriorly.  There appeared to be no slip or hiatal hernia could be seen.  At this time the scope withdrawn into the esophagus and the dilation balloon was introduced into the GE junction.  At this time the balloon was advanced to the GE junction and the balloon was insufflated to 15 mm-16.5-18 mm in succession.   Patient tolerated this portion well.  The superior to not have undue stricture at 18 mm.  At this time Kenalog was diluted and 10 cc in each quadrant was injected under direct visualization.  This was done just proximal to the GE junction.  At this time the insufflation of the stomach was removed after a photograph of the wrap was seen in retroflexion.  The GE junction was photographed.  At this time insufflation was evacuated scope was removed.  Patient taught procedure well was taken to the recovery in stable condition.   PLAN OF CARE: Discharge to home after PACU  PATIENT DISPOSITION:  PACU - hemodynamically stable.   Delay start of Pharmacological VTE agent (>24hrs) due to surgical blood loss or risk of bleeding: not applicable

## 2020-08-05 ENCOUNTER — Encounter (HOSPITAL_COMMUNITY): Payer: Self-pay | Admitting: General Surgery

## 2020-08-05 NOTE — Anesthesia Postprocedure Evaluation (Signed)
Anesthesia Post Note  Patient: Krystal Harris  Procedure(s) Performed: ESOPHAGOGASTRODUODENOSCOPY (EGD) with balloon dilation (N/A )     Patient location during evaluation: PACU Anesthesia Type: MAC Level of consciousness: awake and alert Pain management: pain level controlled Vital Signs Assessment: post-procedure vital signs reviewed and stable Respiratory status: spontaneous breathing Cardiovascular status: stable Anesthetic complications: no   No complications documented.  Last Vitals:  Vitals:   08/04/20 1315 08/04/20 1326  BP: 138/88 138/90  Pulse: 86 87  Resp: 19 18  Temp: 36.7 C 37.1 C  SpO2: 99% 99%    Last Pain:  Vitals:   08/04/20 1326  TempSrc: Oral  PainSc:                  Nolon Nations

## 2020-08-13 ENCOUNTER — Telehealth (INDEPENDENT_AMBULATORY_CARE_PROVIDER_SITE_OTHER): Payer: BC Managed Care – PPO | Admitting: Family Medicine

## 2020-08-13 ENCOUNTER — Other Ambulatory Visit: Payer: Self-pay

## 2020-08-13 ENCOUNTER — Encounter: Payer: Self-pay | Admitting: Family Medicine

## 2020-08-13 VITALS — Temp 98.2°F | Ht 64.5 in | Wt 248.0 lb

## 2020-08-13 DIAGNOSIS — F419 Anxiety disorder, unspecified: Secondary | ICD-10-CM

## 2020-08-13 DIAGNOSIS — F32A Depression, unspecified: Secondary | ICD-10-CM | POA: Diagnosis not present

## 2020-08-13 MED ORDER — BUSPIRONE HCL 7.5 MG PO TABS: 7.5000 mg | ORAL_TABLET | Freq: Two times a day (BID) | ORAL | 3 refills | Status: DC

## 2020-08-13 MED ORDER — VENLAFAXINE HCL 100 MG PO TABS: 100.0000 mg | ORAL_TABLET | Freq: Two times a day (BID) | ORAL | 3 refills | Status: DC

## 2020-08-13 NOTE — Progress Notes (Signed)
Virtual Visit via Video   I connected with patient on 08/13/20 at 11:00 AM EDT by a video enabled telemedicine application and verified that I am speaking with the correct person using two identifiers.  Location patient: Home Location provider: Acup[facility]ist, Office Persons participating in the virtual visit: Patient, Provider, [place] (Jess B)  I discussed the limitations of evaluation and management by telemedicine and the availability of in person appointments. The patient expressed understanding and agreed to proceed.  Subjective:   HPI:   Anxiety/Depression- pt scored 18 on PHQ9 today.  'Life has changed a lot and I don't do well with change'.  Pt didn't feel the short acting Effexor was as effective as the extended release formulation.  Had surgery in August, started grad school.  Pt has prolonged recovery- is now 8 weeks post-op and is still on pureed diet.  Is teaching kindergarten, in grad school, recovering from surgery.  Feeling overwhelmed.  Not sleeping.  Pt asked surgeon last week if she could take pills but she is still forced to crush medication  ROS:   See pertinent positives and negatives per HPI.  Patient Active Problem List   Diagnosis Date Noted   Status post robot-assisted surgical procedure 06/01/2020   S/P Nissen fundoplication (without gastrostomy tube) procedure 06/01/2020   GERD (gastroesophageal reflux disease) 05/01/2019   Knee osteoarthritis 09/12/2017   Anxiety 04/11/2017   Insomnia 04/11/2017   Fatty liver 09/27/2016    Social History   Tobacco Use   Smoking status: Never Smoker   Smokeless tobacco: Never Used  Substance Use Topics   Alcohol use: Yes    Comment: occasionally    Current Outpatient Medications:    acetaminophen (TYLENOL) 500 MG tablet, Take 1,000 mg by mouth every 6 (six) hours as needed for mild pain or headache., Disp: , Rfl:    ALPRAZolam (XANAX) 0.5 MG tablet, Take 1 tablet (0.5 mg total) by mouth 2 (two)  times daily as needed for anxiety., Disp: 30 tablet, Rfl: 1   Ascorbic Acid (VITAMIN C) 500 MG CAPS, Take 500 mg by mouth daily. , Disp: , Rfl:    Cholecalciferol (VITAMIN D3 PO), Take 3,000 Units by mouth daily at 12 noon. , Disp: , Rfl:    traZODone (DESYREL) 100 MG tablet, TAKE 1 TABLET BY MOUTH EVERYDAY AT BEDTIME (Patient taking differently: Take 100 mg by mouth at bedtime. ), Disp: 30 tablet, Rfl: 3   HYDROcodone-acetaminophen (HYCET) 7.5-325 mg/15 ml solution, Take 15 mLs by mouth 4 (four) times daily as needed for moderate pain. (Patient not taking: Reported on 07/21/2020), Disp: 120 mL, Rfl: 0   venlafaxine XR (EFFEXOR-XR) 75 MG 24 hr capsule, Take 75 mg by mouth daily with breakfast. (Patient not taking: Reported on 08/13/2020), Disp: , Rfl:   Allergies  Allergen Reactions   Penicillins Hives and Nausea And Vomiting    Has patient had a PCN reaction causing immediate rash, facial/tongue/throat swelling, SOB or lightheadedness with hypotension: no Has patient had a PCN reaction causing severe rash involving mucus membranes or skin necrosis: unknown Has patient had a PCN reaction that required hospitalization unknown Has patient had a PCN reaction occurring within the last 10 years: unknown If all of the above answers are "NO", then may proceed with Cephalosporin use.     Objective:   Temp 98.2 F (36.8 C) (Oral)    Ht 5' 4.5" (1.638 m)    Wt 248 lb (112.5 kg)    BMI 41.91 kg/m  AAOx3, NAD NCAT, EOMI No obvious CN deficits Coloring WNL Pt is able to speak clearly, coherently without shortness of breath or increased work of breathing.  Thought process is linear.  Tearful  Assessment and Plan:   Anxiety/depression- deteriorated.  Pt scored an 18 on PHQ today.  She reports that since surgery she has really been struggling.  She does not feel that the immediate release Effexor is as effective as the extended release but she is not able to take extended release b/c she still  has to crush her pills.  Will increase Effexor to 100mg  BID and will also add Buspar 7.5mg  BID to improve anxiety component.  Will follow closely for improvement.  Pt expressed understanding and is in agreement w/ plan.    [name], MD 08/13/2020

## 2020-09-08 ENCOUNTER — Encounter: Payer: Self-pay | Admitting: Family Medicine

## 2020-09-08 ENCOUNTER — Other Ambulatory Visit: Payer: Self-pay

## 2020-09-08 ENCOUNTER — Ambulatory Visit (INDEPENDENT_AMBULATORY_CARE_PROVIDER_SITE_OTHER): Payer: BC Managed Care – PPO | Admitting: Family Medicine

## 2020-09-08 VITALS — BP 122/80 | HR 88 | Temp 97.3°F | Resp 20 | Ht 64.0 in | Wt 251.6 lb

## 2020-09-08 DIAGNOSIS — N926 Irregular menstruation, unspecified: Secondary | ICD-10-CM | POA: Diagnosis not present

## 2020-09-08 DIAGNOSIS — F419 Anxiety disorder, unspecified: Secondary | ICD-10-CM

## 2020-09-08 LAB — HCG, QUANTITATIVE, PREGNANCY: Quantitative HCG: <0.6 m[IU]/mL

## 2020-09-08 NOTE — Patient Instructions (Signed)
Schedule your complete physical in 3-4 months We'll notify you of your lab results and determine the next step No med changes at this time- yay!!! Continue to work on healthy diet and regular exercise- you're doing great!!! I am SO glad that you are feeling better!!! Call with any questions or concerns Stay Safe!  Stay Healthy! Happy Thanksgiving!!!

## 2020-09-08 NOTE — Progress Notes (Signed)
   Subjective:    Patient ID: Krystal Harris, female    DOB: 1986/02/24, 34 y.o.   MRN: [id number]  HPI Anxiety/Depression- at last visit Effexor was increased to 100mg  BID and Buspar 7.5mg  BID was added.  PHQ 18 --> 1.  Co-workers have noticed a big improvement.  At times she will miss the 2nd dose 'and I can feel it'.  Pt is very pleased w/ the results and how she is feeling.  Pt now wakes in the morning feeling good.  ? Pregnancy- pt had unprotected intercourse prior to her last period.  Then her period was very abnormal for her- 2 days of very light flow then it stopped for 2 days and then returned 'full force'.    Obesity- pt is down 23 lbs since surgery in August.  Review of Systems For ROS see HPI   This visit occurred during the SARS-CoV-2 public health emergency.  Safety protocols were in place, including screening questions prior to the visit, additional usage of staff PPE, and extensive cleaning of exam room while observing appropriate contact time as indicated for disinfecting solutions.       Objective:   Physical Exam Vitals reviewed.  Constitutional:      General: She is not in acute distress.    Appearance: Normal appearance. She is obese. She is not ill-appearing.  HENT:     Head: Normocephalic and atraumatic.  Skin:    General: Skin is warm and dry.  Neurological:     General: No focal deficit present.     Mental Status: She is alert and oriented to person, place, and time.  Psychiatric:        Mood and Affect: Mood normal.        Behavior: Behavior normal.        Thought Content: Thought content normal.           Assessment & Plan:  Abnormal period- new.  Suspect that she is not pregnant given that she did have a full, 'normal' period after the 2 days of light flow, but will get HCG to check.  Discussed need for condoms for all sexual encounters unless pregnancy is desired.

## 2020-09-08 NOTE — Assessment & Plan Note (Signed)
Much improved w/ increased Effexor dose (100mg  BID) and addition of Buspar 7.5mg  BID.  Pt is very pleased with the results and those around her have noticed a big change.  No med changes at this time.  Will follow.

## 2020-09-08 NOTE — Assessment & Plan Note (Signed)
Pt is down 23 lbs since her surgery in August.  Encouraged her to continue w/ healthy diet and regular exercise.  Will follow.

## 2020-09-22 ENCOUNTER — Other Ambulatory Visit: Payer: Self-pay | Admitting: Family Medicine

## 2020-09-22 NOTE — Telephone Encounter (Signed)
LFD 05/03/20 #30 with 3 refills LOV 09/08/20 NOV none

## 2020-10-13 ENCOUNTER — Other Ambulatory Visit: Payer: BC Managed Care – PPO

## 2020-10-13 ENCOUNTER — Other Ambulatory Visit: Payer: Self-pay

## 2020-10-13 DIAGNOSIS — Z20822 Contact with and (suspected) exposure to covid-19: Secondary | ICD-10-CM

## 2020-10-15 LAB — NOVEL CORONAVIRUS, NAA: SARS-CoV-2, NAA: NOT DETECTED

## 2020-10-15 LAB — SARS-COV-2, NAA 2 DAY TAT

## 2020-10-26 ENCOUNTER — Other Ambulatory Visit: Payer: BC Managed Care – PPO

## 2020-10-30 ENCOUNTER — Other Ambulatory Visit: Payer: BC Managed Care – PPO

## 2020-11-18 ENCOUNTER — Other Ambulatory Visit: Payer: Self-pay

## 2020-11-18 DIAGNOSIS — Z20822 Contact with and (suspected) exposure to covid-19: Secondary | ICD-10-CM

## 2020-11-19 LAB — NOVEL CORONAVIRUS, NAA: SARS-CoV-2, NAA: NOT DETECTED

## 2020-11-19 LAB — SARS-COV-2, NAA 2 DAY TAT

## 2020-11-26 ENCOUNTER — Encounter: Payer: Self-pay | Admitting: Family Medicine

## 2020-11-26 ENCOUNTER — Telehealth (INDEPENDENT_AMBULATORY_CARE_PROVIDER_SITE_OTHER): Payer: Self-pay | Admitting: Family Medicine

## 2020-11-26 DIAGNOSIS — F419 Anxiety disorder, unspecified: Secondary | ICD-10-CM

## 2020-11-26 MED ORDER — BUSPIRONE HCL 15 MG PO TABS: 15.0000 mg | ORAL_TABLET | Freq: Two times a day (BID) | ORAL | 3 refills | Status: DC

## 2020-11-26 NOTE — Progress Notes (Signed)
I connected with  Krystal Harris on 11/26/20 by a video enabled telemedicine application and verified that I am speaking with the correct person using two identifiers.   I discussed the limitations of evaluation and management by telemedicine. The patient expressed understanding and agreed to proceed.

## 2020-11-26 NOTE — Progress Notes (Signed)
Virtual Visit via Video   I connected with patient on 11/26/20 at  9:30 AM EST by a video enabled telemedicine application and verified that I am speaking with the correct person using two identifiers.  Location patient: Home Location provider: [name], Office Persons participating in the virtual visit: Patient, Provider, [place] (Sabrina M)  I discussed the limitations of evaluation and management by telemedicine and the availability of in person appointments. The patient expressed understanding and agreed to proceed.  Subjective:   HPI:   Panic attacks- 'i'm not in a bad space- not super depressed'.  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  Doesn't take her Trazodone on Wednesday nights due to late class.  Pt was late picking up her prescriptions for Effexor and Buspar and did not have meds for 4 days.  Noted a ringing in her ears that resolved when she restarted the medication.  She got a speeding ticket and had an upcoming court date- which caused a panic attack.  Pt reports her chest will burn when she has anxiety and today- just talking about her anxiety- is causing chest burning.  ROS:   See pertinent positives and negatives per HPI.  Patient Active Problem List   Diagnosis Date Noted  . Status post robot-assisted surgical procedure 06/01/2020  . S/P Nissen fundoplication (without gastrostomy tube) procedure 06/01/2020  . GERD (gastroesophageal reflux disease) 05/01/2019  . Knee osteoarthritis 09/12/2017  . Morbid obesity ([place]) 08/13/2017  . Anxiety 04/11/2017  . Insomnia 04/11/2017  . Fatty liver 09/27/2016    Social History   Tobacco Use  . Smoking status: Never Smoker  . Smokeless tobacco: Never Used  Substance Use Topics  . Alcohol use: Yes    Comment: occasionally    Current Outpatient Medications:  .  ALPRAZolam (XANAX) 0.5 MG tablet, Take 1 tablet (0.5 mg total) by mouth 2 (two) times daily as needed for anxiety., Disp:  30 tablet, Rfl: 1 .  Ascorbic Acid (VITAMIN C) 500 MG CAPS, Take 500 mg by mouth daily. , Disp: , Rfl:  .  busPIRone (BUSPAR) 7.5 MG tablet, Take 1 tablet (7.5 mg total) by mouth 2 (two) times daily., Disp: 60 tablet, Rfl: 3 .  Cholecalciferol (VITAMIN D3 PO), Take 3,000 Units by mouth daily at 12 noon. , Disp: , Rfl:  .  traZODone (DESYREL) 100 MG tablet, TAKE 1 TABLET BY MOUTH EVERYDAY AT BEDTIME, Disp: 30 tablet, Rfl: 1 .  venlafaxine (EFFEXOR) 100 MG tablet, Take 1 tablet (100 mg total) by mouth 2 (two) times daily., Disp: 60 tablet, Rfl: 3 .  acetaminophen (TYLENOL) 500 MG tablet, Take 1,000 mg by mouth every 6 (six) hours as needed for mild pain or headache. (Patient not taking: Reported on 11/26/2020), Disp: , Rfl:  .  diltiazem (CARDIZEM) 60 MG tablet, Take by mouth. (Patient not taking: Reported on 11/26/2020), Disp: , Rfl:  .  HYDROcodone-acetaminophen (HYCET) 7.5-325 mg/15 ml solution, Take 15 mLs by mouth 4 (four) times daily as needed for moderate pain. (Patient not taking: No sig reported), Disp: 120 mL, Rfl: 0  Allergies  Allergen Reactions  . Penicillins Hives and Nausea And Vomiting    Has patient had a PCN reaction causing immediate rash, facial/tongue/throat swelling, SOB or lightheadedness with hypotension: no Has patient had a PCN reaction causing severe rash involving mucus membranes or skin necrosis: unknown Has patient had a PCN reaction that required hospitalization unknown Has patient had a PCN reaction occurring within the last  10 years: unknown If all of the above answers are "NO", then may proceed with Cephalosporin use.     Objective:   There were no vitals taken for this visit. AAOx3, NAD NCAT, EOMI No obvious CN deficits Coloring WNL Pt is able to speak clearly, coherently without shortness of breath or increased work of breathing.  Thought process is linear.  Mood is appropriate.   Assessment and Plan:   Anxiety- deteriorated.  Pt has had recurrent  panic attacks in the last few weeks.  She did run out of her Effexor and Buspar for 4 days so this likely contributed.  But she still feels increased anxiety despite restarting meds.  Will increase Buspar to 15mg  BID.  Pt expressed understanding and is in agreement w/ plan.    [name], MD 11/26/2020

## 2020-12-01 ENCOUNTER — Other Ambulatory Visit: Payer: Self-pay

## 2020-12-01 DIAGNOSIS — Z20822 Contact with and (suspected) exposure to covid-19: Secondary | ICD-10-CM

## 2020-12-02 LAB — NOVEL CORONAVIRUS, NAA: SARS-CoV-2, NAA: NOT DETECTED

## 2020-12-02 LAB — SARS-COV-2, NAA 2 DAY TAT

## 2020-12-25 ENCOUNTER — Other Ambulatory Visit: Payer: Self-pay | Admitting: Family Medicine

## 2021-01-07 ENCOUNTER — Other Ambulatory Visit: Payer: Self-pay

## 2021-01-07 ENCOUNTER — Ambulatory Visit (INDEPENDENT_AMBULATORY_CARE_PROVIDER_SITE_OTHER): Payer: BC Managed Care – PPO | Admitting: Family Medicine

## 2021-01-07 ENCOUNTER — Encounter: Payer: Self-pay | Admitting: Family Medicine

## 2021-01-07 VITALS — BP 132/80 | HR 95 | Temp 97.3°F | Resp 18 | Ht 64.0 in | Wt 268.0 lb

## 2021-01-07 DIAGNOSIS — Z0000 Encounter for general adult medical examination without abnormal findings: Secondary | ICD-10-CM

## 2021-01-07 DIAGNOSIS — F419 Anxiety disorder, unspecified: Secondary | ICD-10-CM | POA: Diagnosis not present

## 2021-01-07 LAB — VITAMIN D 25 HYDROXY (VIT D DEFICIENCY, FRACTURES): VITD: 22.71 ng/mL — ABNORMAL LOW (ref 30.00–100.00)

## 2021-01-07 LAB — CBC WITH DIFFERENTIAL/PLATELET
Basophils Absolute: 0 10*3/uL (ref 0.0–0.1)
Basophils Relative: 0.8 % (ref 0.0–3.0)
Eosinophils Absolute: 0 10*3/uL (ref 0.0–0.7)
Eosinophils Relative: 0.5 % (ref 0.0–5.0)
HCT: 33.5 % — ABNORMAL LOW (ref 36.0–46.0)
Hemoglobin: 10.6 g/dL — ABNORMAL LOW (ref 12.0–15.0)
Lymphocytes Relative: 20.5 % (ref 12.0–46.0)
Lymphs Abs: 1.1 10*3/uL (ref 0.7–4.0)
MCHC: 31.5 g/dL (ref 30.0–36.0)
MCV: 81.7 fl (ref 78.0–100.0)
Monocytes Absolute: 0.4 10*3/uL (ref 0.1–1.0)
Monocytes Relative: 6.8 % (ref 3.0–12.0)
Neutro Abs: 3.7 10*3/uL (ref 1.4–7.7)
Neutrophils Relative %: 71.4 % (ref 43.0–77.0)
Platelets: 346 10*3/uL (ref 150.0–400.0)
RBC: 4.1 Mil/uL (ref 3.87–5.11)
RDW: 15.4 % (ref 11.5–15.5)
WBC: 5.2 10*3/uL (ref 4.0–10.5)

## 2021-01-07 LAB — LIPID PANEL
Cholesterol: 173 mg/dL (ref 0–200)
HDL: 62 mg/dL (ref >39.00)
LDL Cholesterol: 101 mg/dL — ABNORMAL HIGH (ref 0–99)
NonHDL: 110.9
Total CHOL/HDL Ratio: 3
Triglycerides: 50 mg/dL (ref 0.0–149.0)
VLDL: 10 mg/dL (ref 0.0–40.0)

## 2021-01-07 LAB — HEPATIC FUNCTION PANEL
ALT: 12 U/L (ref 0–35)
AST: 17 U/L (ref 0–37)
Albumin: 4 g/dL (ref 3.5–5.2)
Alkaline Phosphatase: 98 U/L (ref 39–117)
Bilirubin, Direct: 0 mg/dL (ref 0.0–0.3)
Total Bilirubin: 0.2 mg/dL (ref 0.2–1.2)
Total Protein: 8.1 g/dL (ref 6.0–8.3)

## 2021-01-07 LAB — BASIC METABOLIC PANEL
BUN: 7 mg/dL (ref 6–23)
CO2: 24 mEq/L (ref 19–32)
Calcium: 9.3 mg/dL (ref 8.4–10.5)
Chloride: 104 mEq/L (ref 96–112)
Creatinine, Ser: 0.72 mg/dL (ref 0.40–1.20)
GFR: 110.41 mL/min (ref >60.00)
Glucose, Bld: 90 mg/dL (ref 70–99)
Potassium: 4 mEq/L (ref 3.5–5.1)
Sodium: 136 mEq/L (ref 135–145)

## 2021-01-07 LAB — TSH: TSH: 0.68 u[IU]/mL (ref 0.35–4.50)

## 2021-01-07 NOTE — Assessment & Plan Note (Signed)
Deteriorated.  Pt has stopped all medication b/c she is making a conscious choice to get pregnant.  She is now second guessing that choice and wondering what others will think if she is pregnant.  Since she is off the medication, this has sent her spiraling.  Discussed that Buspar and Trazodone were safe to take in pregnancy but she should not take the Alprazolam or Effexor at this point.  Encouraged her to start a prenatal vitamin daily and continue healthy diet and regular exercise.  Had a long talk about personal choices and how no one should shame her or judge her for them.  Will continue to follow closely.

## 2021-01-07 NOTE — Assessment & Plan Note (Signed)
Deteriorated.  Pt has gained 10 lbs since January.  Stressed need for healthy diet and regular exercise.  Check labs to risk stratify.  Will follow.

## 2021-01-07 NOTE — Assessment & Plan Note (Signed)
Pt's PE WNL w/ exception of obesity.  UTD on pap, immunizations.  Check labs.  Anticipatory guidance provided.

## 2021-01-07 NOTE — Progress Notes (Signed)
   Subjective:    Patient ID: Krystal Harris, female    DOB: May 09, 1986, 35 y.o.   MRN: [id number]  HPI CPE- UTD on pap, Tdap, flu, COVID.  Reviewed past medical, surgical, family and social histories.   Patient Care Team    Relationship Specialty Notifications Start End  [name], MD PCP - General Family Medicine  02/12/13   Servando Salina, MD Consulting Physician Obstetrics and Gynecology  03/22/18     Health Maintenance  Topic Date Due  . COVID-19 Vaccine (3 - Booster for Pfizer series) 01/07/2021  . Hepatitis C Screening  08/13/2021 (Originally 1986-06-25)  . HIV Screening  11/26/2021 (Originally 04/26/2003)  . PAP SMEAR-Modifier  05/23/2022  . TETANUS/TDAP  09/16/2023  . INFLUENZA VACCINE  Completed  . HPV VACCINES  Aged Out      Review of Systems Patient reports no vision/ hearing changes, adenopathy,fever, persistant/recurrent hoarseness , swallowing issues, chest pain, palpitations, edema, persistant/recurrent cough, hemoptysis, dyspnea (rest/exertional/paroxysmal nocturnal), gastrointestinal bleeding (melena, rectal bleeding), abdominal pain, significant heartburn, bowel changes, GU symptoms (dysuria, hematuria, incontinence), Gyn symptoms (abnormal  bleeding, pain),  syncope, focal weakness, memory loss, numbness & tingling, skin/hair/nail changes, abnormal bruising or bleeding.   + 10 lb weight gain since January + anxiety- pt has not been taking any medication since she had unprotected sex during her 'fertile window'.  Period is due in 2 weeks.  She is fearful of what she can take in case of pregnancy.  She wants to be a mother but feels that others may judge her b/c she doesn't have a long term partner.  This visit occurred during the SARS-CoV-2 public health emergency.  Safety protocols were in place, including screening questions prior to the visit, additional usage of staff PPE, and extensive cleaning of exam room while observing appropriate contact time as  indicated for disinfecting solutions.       Objective:   Physical Exam General Appearance:    Alert, cooperative, no distress, appears stated age, obese  Head:    Normocephalic, without obvious abnormality, atraumatic  Eyes:    PERRL, conjunctiva/corneas clear, EOM's intact, fundi    benign, both eyes  Ears:    Normal TM's and external ear canals, both ears  Nose:   Deferred due to COVID  Throat:   Neck:   Supple, symmetrical, trachea midline, no adenopathy;    Thyroid: no enlargement/tenderness/nodules  Back:     Symmetric, no curvature, ROM normal, no CVA tenderness  Lungs:     Clear to auscultation bilaterally, respirations unlabored  Chest Wall:    No tenderness or deformity   Heart:    Regular rate and rhythm, S1 and S2 normal, no murmur, rub   or gallop  Breast Exam:    Deferred to GYN  Abdomen:     Soft, non-tender, bowel sounds active all four quadrants,    no masses, no organomegaly  Genitalia:    Deferred to GYN  Rectal:    Extremities:   Extremities normal, atraumatic, no cyanosis or edema  Pulses:   2+ and symmetric all extremities  Skin:   Skin color, texture, turgor normal, no rashes or lesions  Lymph nodes:   Cervical, supraclavicular, and axillary nodes normal  Neurologic:   CNII-XII intact, normal strength, sensation and reflexes    throughout          Assessment & Plan:

## 2021-01-07 NOTE — Patient Instructions (Signed)
Follow up in 3 months to recheck mood We'll notify you of your lab results and make any changes if needed RESTART the Buspar and the Trazodone HOLD on the alprazolam and effexor START an OTC pre-natal vitamin Continue to work on healthy diet and regular exercise- healthy you equals healthy future baby! Call with any questions or concerns You. Got. This.

## 2021-01-10 ENCOUNTER — Other Ambulatory Visit: Payer: Self-pay

## 2021-01-10 DIAGNOSIS — E559 Vitamin D deficiency, unspecified: Secondary | ICD-10-CM

## 2021-01-10 MED ORDER — VITAMIN D (ERGOCALCIFEROL) 1.25 MG (50000 UNIT) PO CAPS: 50000.0000 [IU] | ORAL_CAPSULE | ORAL | 0 refills | Status: DC

## 2021-03-08 ENCOUNTER — Other Ambulatory Visit: Payer: Self-pay | Admitting: Family Medicine

## 2021-03-16 ENCOUNTER — Encounter: Payer: Self-pay | Admitting: Family Medicine

## 2021-03-16 ENCOUNTER — Telehealth (INDEPENDENT_AMBULATORY_CARE_PROVIDER_SITE_OTHER): Payer: BC Managed Care – PPO | Admitting: Family Medicine

## 2021-03-16 DIAGNOSIS — F419 Anxiety disorder, unspecified: Secondary | ICD-10-CM

## 2021-03-16 MED ORDER — VENLAFAXINE HCL ER 75 MG PO CP24
75.0000 mg | ORAL_CAPSULE | Freq: Every day | ORAL | 3 refills | Status: DC
Start: 2021-03-16 — End: 2021-04-15

## 2021-03-16 MED ORDER — BUSPIRONE HCL 30 MG PO TABS
30.0000 mg | ORAL_TABLET | Freq: Two times a day (BID) | ORAL | 3 refills | Status: DC
Start: 2021-03-16 — End: 2021-10-26

## 2021-03-16 MED ORDER — ALPRAZOLAM 0.5 MG PO TABS: 0.5000 mg | ORAL_TABLET | Freq: Two times a day (BID) | ORAL | 1 refills | Status: DC | PRN

## 2021-03-16 NOTE — Progress Notes (Signed)
Virtual Visit via Video   I connected with patient on 03/16/21 at 11:00 AM EDT by a video enabled telemedicine application and verified that I am speaking with the correct person using two identifiers.  Location patient: Home Location provider: [name], [facility] Persons participating in the virtual visit: Patient, Provider, [name]  I discussed the limitations of evaluation and management by telemedicine and the availability of in person appointments. The patient expressed understanding and agreed to proceed.  Subjective:   HPI:   Anxiety/Depression- ongoing issue for pt.  Currently on Venlafaxine 143m BID and Buspar 175mBID.  Scored 20 on PHQ9 today.  Pt reports feeling 'super anxious'- pacing, chest tightness.  Is working through things in therapy- she doesn't want to be around other people.  'the more people that are added' the more she pulls back.  Sxs started mid-April.  Again having trouble sleeping despite the Trazodone.  'I feel like I'm sinking again'.  She had a pregnancy scare and stopped taking her medication.  Since restarting she has felt that 'something was off'.  Restarted in late April.  Has previously been on Sertraline, Fluoxetine, Wellbutrin  ROS:   See pertinent positives and negatives per HPI.  Patient Active Problem List   Diagnosis Date Noted  . Status post robot-assisted surgical procedure 06/01/2020  . S/P Nissen fundoplication (without gastrostomy tube) procedure 06/01/2020  . GERD (gastroesophageal reflux disease) 05/01/2019  . Knee osteoarthritis 09/12/2017  . [place]  . Anxiety 04/11/2017  . Insomnia 04/11/2017  . Fatty liver 09/27/2016  . Physical exam 02/21/2013    Social History   Tobacco Use  . Smoking status: Never Smoker  . Smokeless tobacco: Never Used  Substance Use Topics  . Alcohol use: Yes    Comment: occasionally    Current Outpatient Medications:  .  ALPRAZolam (XANAX) 0.5 MG tablet,  Take 1 tablet (0.5 mg total) by mouth 2 (two) times daily as needed for anxiety., Disp: 30 tablet, Rfl: 1 .  Ascorbic Acid (VITAMIN C) 500 MG CAPS, Take 500 mg by mouth daily. , Disp: , Rfl:  .  busPIRone (BUSPAR) 15 MG tablet, Take 1 tablet (15 mg total) by mouth 2 (two) times daily., Disp: 60 tablet, Rfl: 3 .  Cholecalciferol (VITAMIN D3 PO), Take 3,000 Units by mouth daily at 12 noon. , Disp: , Rfl:  .  ID NOW COVID-19 KIT, TEST AS DIRECTED TODAY, Disp: , Rfl:  .  traZODone (DESYREL) 100 MG tablet, TAKE 1 TABLET BY MOUTH EVERYDAY AT BEDTIME, Disp: 30 tablet, Rfl: 1 .  venlafaxine (EFFEXOR) 100 MG tablet, Take 1 tablet (100 mg total) by mouth 2 (two) times daily., Disp: 60 tablet, Rfl: 3 .  Vitamin D, Ergocalciferol, (DRISDOL) 1.25 MG (50000 UNIT) CAPS capsule, Take 1 capsule (50,000 Units total) by mouth every 7 (seven) days., Disp: 12 capsule, Rfl: 0  Allergies  Allergen Reactions  . Penicillins Hives and Nausea And Vomiting    Has patient had a PCN reaction causing immediate rash, facial/tongue/throat swelling, SOB or lightheadedness with hypotension: no Has patient had a PCN reaction causing severe rash involving mucus membranes or skin necrosis: unknown Has patient had a PCN reaction that required hospitalization unknown Has patient had a PCN reaction occurring within the last 10 years: unknown If all of the above answers are "NO", then may proceed with Cephalosporin use.     Objective:   There were no vitals taken for this visit. AAOx3, NAD NCAT,  EOMI No obvious CN deficits Coloring WNL Pt is able to speak clearly, coherently without shortness of breath or increased work of breathing.  Thought process is linear.  Mood is appropriate.   Assessment and Plan:   Anxiety- deteriorated.  Pt states that since stopping her Effexor and then restarting it, things 'haven't been right'.  She feels that her sxs are all truly anxiety based and that any depression she has comes from her  worsening anxiety.  A lot of her PHQ scores were sleep related.  I suspect she restarted the Effexor at too high of a dose and has possibly worsened her anxiety.  Will increase the Buspar to 101m BID and decrease the Effexor back to 738mextended release.  Will follow closely as this has been an ongoing issue for pt.  Pt expressed understanding and is in agreement w/ plan.    KaAnnye AsaMD 03/16/2021

## 2021-03-16 NOTE — Progress Notes (Signed)
I connected with  Krystal Harris on 03/16/21 by a video enabled telemedicine application and verified that I am speaking with the correct person using two identifiers.   I discussed the limitations of evaluation and management by telemedicine. The patient expressed understanding and agreed to proceed.

## 2021-04-06 ENCOUNTER — Telehealth: Payer: Self-pay

## 2021-04-06 NOTE — Telephone Encounter (Signed)
Pt was last seen on 06/01 and pt has a cancellation on 06/24 would Dr Birdie Riddle like [place] to schedule another appt for her?

## 2021-04-06 NOTE — Telephone Encounter (Signed)
Pt's 6/24 appt was canceled in error.  She is due to follow up sometime around 6/24 if possible

## 2021-04-06 NOTE — Telephone Encounter (Signed)
Made appt for Friday July 1st at 10:30 virtual

## 2021-04-08 ENCOUNTER — Telehealth: Payer: BC Managed Care – PPO | Admitting: Family Medicine

## 2021-04-13 ENCOUNTER — Encounter: Payer: Self-pay | Admitting: *Deleted

## 2021-04-15 ENCOUNTER — Other Ambulatory Visit: Payer: Self-pay

## 2021-04-15 ENCOUNTER — Telehealth (INDEPENDENT_AMBULATORY_CARE_PROVIDER_SITE_OTHER): Payer: BC Managed Care – PPO | Admitting: Family Medicine

## 2021-04-15 ENCOUNTER — Encounter: Payer: Self-pay | Admitting: Family Medicine

## 2021-04-15 DIAGNOSIS — F419 Anxiety disorder, unspecified: Secondary | ICD-10-CM | POA: Diagnosis not present

## 2021-04-15 DIAGNOSIS — F32A Depression, unspecified: Secondary | ICD-10-CM

## 2021-04-15 MED ORDER — VENLAFAXINE HCL ER 150 MG PO CP24: 150.0000 mg | ORAL_CAPSULE | Freq: Every day | ORAL | 3 refills | Status: DC

## 2021-04-15 NOTE — Progress Notes (Signed)
I connected with  Krystal Harris on 04/15/21 by a video enabled telemedicine application and verified that I am speaking with the correct person using two identifiers.   I discussed the limitations of evaluation and management by telemedicine. The patient expressed understanding and agreed to proceed.

## 2021-04-15 NOTE — Progress Notes (Signed)
 Virtual Visit via Video   I connected with patient on 04/15/21 at 10:30 AM EDT by a video enabled telemedicine application and verified that I am speaking with the correct person using two identifiers.  Location patient: Home Location provider: [place], Office Persons participating in the virtual visit: Patient, Provider, CMA (Sabrina M)  I discussed the limitations of evaluation and management by telemedicine and the availability of in person appointments. The patient expressed understanding and agreed to proceed.  Subjective:   HPI:   Mood- at last visit Buspar was increased to 30mg BID and Venlafaxine was decreased to 75mg daily.  PHQ has improved from 20--> 13.  Pt reports she 'feels like I'm getting there'.  Pt reports increased Buspar dose causes her to 'feel weird'.  At times pt will skip 2nd dose of Buspar b/c if she takes it too late it keeps her up at night.  Pt reports some days are better than others.  'i can get through the day'.  Pt is in therapy- visits q2 weeks.  ROS:   See pertinent positives and negatives per HPI.  Patient Active Problem List   Diagnosis Date Noted   Status post robot-assisted surgical procedure 06/01/2020   S/P Nissen fundoplication (without gastrostomy tube) procedure 06/01/2020   GERD (gastroesophageal reflux disease) 05/01/2019   Knee osteoarthritis 09/12/2017   Morbid obesity ([facility]) 08/13/2017   Anxiety 04/11/2017   Insomnia 04/11/2017   Fatty liver 09/27/2016   Physical exam 02/21/2013    Social History   Tobacco Use   Smoking status: Never   Smokeless tobacco: Never  Substance Use Topics   Alcohol use: Yes    Comment: occasionally    Current Outpatient Medications:    ALPRAZolam (XANAX) 0.5 MG tablet, Take 1 tablet (0.5 mg total) by mouth 2 (two) times daily as needed for anxiety., Disp: 30 tablet, Rfl: 1   Ascorbic Acid (VITAMIN C) 500 MG CAPS, Take 500 mg by mouth daily. , Disp: , Rfl:    busPIRone (BUSPAR) 30 MG  tablet, Take 1 tablet (30 mg total) by mouth in the morning and at bedtime., Disp: 60 tablet, Rfl: 3   Cholecalciferol (VITAMIN D3 PO), Take 3,000 Units by mouth daily at 12 noon. , Disp: , Rfl:    ID NOW COVID-19 KIT, TEST AS DIRECTED TODAY, Disp: , Rfl:    traZODone (DESYREL) 100 MG tablet, TAKE 1 TABLET BY MOUTH EVERYDAY AT BEDTIME, Disp: 30 tablet, Rfl: 1   venlafaxine XR (EFFEXOR XR) 75 MG 24 hr capsule, Take 1 capsule (75 mg total) by mouth daily with breakfast., Disp: 30 capsule, Rfl: 3   Vitamin D, Ergocalciferol, (DRISDOL) 1.25 MG (50000 UNIT) CAPS capsule, Take 1 capsule (50,000 Units total) by mouth every 7 (seven) days., Disp: 12 capsule, Rfl: 0  Allergies  Allergen Reactions   Penicillins Hives and Nausea And Vomiting    Has patient had a PCN reaction causing immediate rash, facial/tongue/throat swelling, SOB or lightheadedness with hypotension: no Has patient had a PCN reaction causing severe rash involving mucus membranes or skin necrosis: unknown Has patient had a PCN reaction that required hospitalization unknown Has patient had a PCN reaction occurring within the last 10 years: unknown If all of the above answers are "NO", then may proceed with Cephalosporin use.     Objective:   There were no vitals taken for this visit. AAOx3, NAD NCAT, EOMI No obvious CN deficits Coloring WNL Pt is able to speak clearly, coherently without shortness   of breath or increased work of breathing.  Thought process is linear.  Mood is appropriate.   Assessment and Plan:   Anxiety/Depression- improving.  Pt has trouble w/ the higher dose of Buspar competing with her trazodone and impacting her sleep.  Feels the higher dose makes her feel 'weird'.  Will decrease back to 15mg BID and increase the Effexor back to 150mg daily.  Pt expressed understanding and is in agreement w/ plan.    Katherine Tabori, MD 04/15/2021   

## 2021-05-08 ENCOUNTER — Other Ambulatory Visit: Payer: Self-pay | Admitting: Family Medicine

## 2021-05-09 NOTE — Telephone Encounter (Signed)
LFD 03/08/21 #30 with 1 refill LOV 04/15/21 NOV none

## 2021-06-21 ENCOUNTER — Telehealth (INDEPENDENT_AMBULATORY_CARE_PROVIDER_SITE_OTHER): Payer: BC Managed Care – PPO | Admitting: Family

## 2021-06-21 ENCOUNTER — Encounter: Payer: Self-pay | Admitting: Family

## 2021-06-21 ENCOUNTER — Other Ambulatory Visit: Payer: Self-pay

## 2021-06-21 VITALS — Ht 64.0 in

## 2021-06-21 DIAGNOSIS — U071 COVID-19: Secondary | ICD-10-CM

## 2021-06-21 DIAGNOSIS — J209 Acute bronchitis, unspecified: Secondary | ICD-10-CM

## 2021-06-21 MED ORDER — DOXYCYCLINE HYCLATE 100 MG PO TABS: 100.0000 mg | ORAL_TABLET | Freq: Two times a day (BID) | ORAL | 0 refills | Status: DC

## 2021-06-21 MED ORDER — BENZONATATE 100 MG PO CAPS: 100.0000 mg | ORAL_CAPSULE | Freq: Three times a day (TID) | ORAL | 0 refills | Status: DC | PRN

## 2021-06-21 NOTE — Progress Notes (Signed)
Krystal Harris is a 35 y.o. female with the following history as recorded in Epic[employer]:  Patient Active Problem List   Diagnosis Date Noted   Status post robot-assisted surgical procedure 06/01/2020   S/P Nissen fundoplication (without gastrostomy tube) procedure 06/01/2020   GERD (gastroesophageal reflux disease) 05/01/2019   Knee osteoarthritis 09/12/2017   Morbid obesity ([place]) 08/13/2017   Anxiety 04/11/2017   Insomnia 04/11/2017   Fatty liver 09/27/2016   Physical exam 02/21/2013    Current Outpatient Medications  Medication Sig Dispense Refill   ALPRAZolam (XANAX) 0.5 MG tablet Take 1 tablet (0.5 mg total) by mouth 2 (two) times daily as needed for anxiety. 30 tablet 1   Ascorbic Acid (VITAMIN C) 500 MG CAPS Take 500 mg by mouth daily.      benzonatate (TESSALON) 100 MG capsule Take 1 capsule (100 mg total) by mouth 3 (three) times daily as needed. 20 capsule 0   busPIRone (BUSPAR) 30 MG tablet Take 1 tablet (30 mg total) by mouth in the morning and at bedtime. 60 tablet 3   Cholecalciferol (VITAMIN D3 PO) Take 3,000 Units by mouth daily at 12 noon.      doxycycline (VIBRA-TABS) 100 MG tablet Take 1 tablet (100 mg total) by mouth 2 (two) times daily. 14 tablet 0   ID NOW COVID-19 KIT TEST AS DIRECTED TODAY     traZODone (DESYREL) 100 MG tablet TAKE 1 TABLET BY MOUTH EVERYDAY AT BEDTIME 30 tablet 1   venlafaxine XR (EFFEXOR-XR) 150 MG 24 hr capsule Take 1 capsule (150 mg total) by mouth daily with breakfast. 30 capsule 3   Vitamin D, Ergocalciferol, (DRISDOL) 1.25 MG (50000 UNIT) CAPS capsule Take 1 capsule (50,000 Units total) by mouth every 7 (seven) days. (Patient not taking: Reported on 06/21/2021) 12 capsule 0   No current facility-administered medications for this visit.    Allergies: Penicillins  Past Medical History:  Diagnosis Date   Anemia    Anxiety    Chicken pox    Depression    Gallbladder problem    GERD (gastroesophageal reflux disease)    PONV (postoperative  nausea and vomiting)    vomiting after surgery gallbladder removal 2016   Vitamin D deficiency     Past Surgical History:  Procedure Laterality Date   BRAVO [place] STUDY N/A 01/22/2020   Procedure: BRAVO [place] STUDY;  Surgeon: Milus Banister, MD;  Location: [facility];  Service: Endoscopy;  Laterality: N/A;   CHOLECYSTECTOMY     laprascopic   CYST EXCISION Right 12/29/2016   Procedure: EXCISION OF RIGHT AXILLARY CYST;  Surgeon: [name], MD;  Location: [facility];  Service: General;  Laterality: Right;   ESOPHAGOGASTRODUODENOSCOPY N/A 08/04/2020   Procedure: ESOPHAGOGASTRODUODENOSCOPY (EGD) with balloon dilation;  Surgeon: [name], MD;  Location: [facility];  Service: General;  Laterality: N/A;   ESOPHAGOGASTRODUODENOSCOPY (EGD) WITH PROPOFOL N/A 01/22/2020   Procedure: ESOPHAGOGASTRODUODENOSCOPY (EGD) WITH PROPOFOL;  Surgeon: Milus Banister, MD;  Location: [facility];  Service: Endoscopy;  Laterality: N/A;   FOOT SURGERY  02/2015   scar tissue and keloid scar removal   FOREIGN BODY REMOVAL Right 09/17/2013   Procedure: REMOVAL FOREIGN BODY NEEDLE PLANTAR ASPECT OF THE FOOT;  Surgeon: Newt Minion, MD;  Location: [facility];  Service: Orthopedics;  Laterality: Right;  Remove Foreign Body Right Foot   LAPAROSCOPIC NISSEN FUNDOPLICATION  40/98/1191   WISDOM TOOTH EXTRACTION      Family History  Problem Relation Age of Onset  Diabetes Mother    Hypertension Mother    Thyroid disease Mother    Obesity Mother    Stroke Maternal Grandmother    Diabetes Maternal Grandfather    Stroke Maternal Grandfather    Hypertension Maternal Grandfather    Lung cancer Maternal Grandfather    Stomach cancer Neg Hx    Colon cancer Neg Hx    Rectal cancer Neg Hx    Pancreatic cancer Neg Hx     Social History   Tobacco Use   Smoking status: Never   Smokeless tobacco: Never  Substance Use Topics   Alcohol use: Yes    Comment: occasionally    Subjective:     I connected with Krystal Harris  on 06/24/21 at  3:40 PM EDT by a video enabled telemedicine application and verified that I am speaking with the correct person using two identifiers.   I discussed the limitations of evaluation and management by telemedicine and the availability of in person appointments. The patient expressed understanding and agreed to proceed. Provider in office/ patient is at home; provider and patient are only 2 people on video call.   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  Head: Normocephalic and atraumatic  Lungs: Respirations unlabored;  Neurologic: Alert and oriented; speech intact; face symmetrical; moves all extremities well; CNII-XII intact without focal deficit   Assessment:  1. COVID-19   2. Acute bronchitis, unspecified organism     Plan:  Concern for secondary bacterial infection; Rx for Doxycycline and Tessalon Perles; increase fluids,rest and follow up worse, no better.   No follow-ups on file.  No orders of the defined types were placed in this encounter.   Requested Prescriptions   Signed Prescriptions Disp Refills   doxycycline (VIBRA-TABS) 100 MG tablet 14 tablet 0    Sig: Take 1 tablet (100 mg total) by mouth 2 (two) times daily.   benzonatate (TESSALON) 100 MG capsule 20 capsule 0    Sig: Take 1 capsule (100 mg total) by mouth 3 (three) times daily as needed.

## 2021-06-28 ENCOUNTER — Encounter: Payer: Self-pay | Admitting: Family Medicine

## 2021-06-28 ENCOUNTER — Telehealth (INDEPENDENT_AMBULATORY_CARE_PROVIDER_SITE_OTHER): Payer: BC Managed Care – PPO | Admitting: Family Medicine

## 2021-06-28 ENCOUNTER — Other Ambulatory Visit: Payer: Self-pay

## 2021-06-28 DIAGNOSIS — F419 Anxiety disorder, unspecified: Secondary | ICD-10-CM | POA: Diagnosis not present

## 2021-06-28 DIAGNOSIS — R110 Nausea: Secondary | ICD-10-CM | POA: Diagnosis not present

## 2021-06-28 MED ORDER — BUPROPION HCL 75 MG PO TABS: 75.0000 mg | ORAL_TABLET | Freq: Two times a day (BID) | ORAL | 3 refills | Status: DC

## 2021-06-28 MED ORDER — VENLAFAXINE HCL 75 MG PO TABS: 150.0000 mg | ORAL_TABLET | Freq: Two times a day (BID) | ORAL | 3 refills | Status: DC
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
## 2021-07-15 ENCOUNTER — Other Ambulatory Visit: Payer: Self-pay

## 2021-07-15 DIAGNOSIS — G4700 Insomnia, unspecified: Secondary | ICD-10-CM

## 2021-07-15 MED ORDER — TRAZODONE HCL 100 MG PO TABS: ORAL_TABLET | ORAL | 1 refills | Status: DC

## 2021-07-15 NOTE — Telephone Encounter (Signed)
Patient next appt is 07/22/21 Needs a refill on Trazadone 100mg 

## 2021-07-15 NOTE — Telephone Encounter (Signed)
Pt is out of town and does not have any medication and needs refill on traZODone (DESYREL) 100 MG tablet   [facility] [address] [place] [address] [phone]  Pt call back [phone]

## 2021-07-22 ENCOUNTER — Telehealth (INDEPENDENT_AMBULATORY_CARE_PROVIDER_SITE_OTHER): Payer: BC Managed Care – PPO | Admitting: Family Medicine

## 2021-07-22 ENCOUNTER — Other Ambulatory Visit: Payer: Self-pay

## 2021-07-22 ENCOUNTER — Encounter: Payer: Self-pay | Admitting: Family Medicine

## 2021-07-22 VITALS — Ht 64.0 in | Wt 280.0 lb

## 2021-07-22 DIAGNOSIS — S8012XA Contusion of left lower leg, initial encounter: Secondary | ICD-10-CM | POA: Diagnosis not present

## 2021-07-22 DIAGNOSIS — F419 Anxiety disorder, unspecified: Secondary | ICD-10-CM

## 2021-07-22 DIAGNOSIS — F32A Depression, unspecified: Secondary | ICD-10-CM | POA: Diagnosis not present

## 2021-07-22 MED ORDER — BUPROPION HCL 100 MG PO TABS: 100.0000 mg | ORAL_TABLET | Freq: Two times a day (BID) | ORAL | 3 refills | Status: DC

## 2021-07-22 NOTE — Progress Notes (Signed)
Virtual Visit via Video   I connected with patient on 07/22/21 at 11:30 AM EDT by a video enabled telemedicine application and verified that I am speaking with the correct person using two identifiers.  Location patient: Home Location provider: [name], Office Persons participating in the virtual visit: Patient, Provider, [place] [name]  I discussed the limitations of evaluation and management by telemedicine and the availability of in person appointments. The patient expressed understanding and agreed to proceed.  Subjective:   HPI:   Anxiety- at last visit we decided to wean the Buspar as she didn't notice any improvement, We switched the Effexor to short acting, and we restarted Wellbutrin.  Pt reports Venlafaxine is causing stomach to hurt.  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  Did not have Trazodone for 5 days while on a trip which greatly impacted sleep.  Pt fell 9/14 and developed a severe bruise and a knot near her knee.  Bruising has resolved but knot remains.  No longer painful.    ROS:   See pertinent positives and negatives per HPI.  Patient Active Problem List   Diagnosis Date Noted   Status post robot-assisted surgical procedure 06/01/2020   S/P Nissen fundoplication (without gastrostomy tube) procedure 06/01/2020   GERD (gastroesophageal reflux disease) 05/01/2019   Knee osteoarthritis 09/12/2017   Morbid obesity ([place]) 08/13/2017   Anxiety 04/11/2017   Insomnia 04/11/2017   Fatty liver 09/27/2016   Physical exam 02/21/2013    Social History   Tobacco Use   Smoking status: Never   Smokeless tobacco: Never  Substance Use Topics   Alcohol use: Yes    Comment: occasionally    Current Outpatient Medications:    ALPRAZolam (XANAX) 0.5 MG tablet, Take 1 tablet (0.5 mg total) by mouth 2 (two) times daily as needed for anxiety., Disp: 30 tablet, Rfl: 1   Ascorbic Acid (VITAMIN C) 500 MG CAPS, Take 500 mg by  mouth daily. , Disp: , Rfl:    benzonatate (TESSALON) 100 MG capsule, Take 1 capsule (100 mg total) by mouth 3 (three) times daily as needed., Disp: 20 capsule, Rfl: 0   buPROPion (WELLBUTRIN) 75 MG tablet, Take 1 tablet (75 mg total) by mouth 2 (two) times daily., Disp: 60 tablet, Rfl: 3   busPIRone (BUSPAR) 30 MG tablet, Take 1 tablet (30 mg total) by mouth in the morning and at bedtime., Disp: 60 tablet, Rfl: 3   Cholecalciferol (VITAMIN D3 PO), Take 3,000 Units by mouth daily at 12 noon. , Disp: , Rfl:    ID NOW COVID-19 KIT, TEST AS DIRECTED TODAY, Disp: , Rfl:    traZODone (DESYREL) 100 MG tablet, TAKE 1 TABLET BY MOUTH EVERYDAY AT BEDTIME, Disp: 30 tablet, Rfl: 1   venlafaxine (EFFEXOR) 75 MG tablet, Take 2 tablets (150 mg total) by mouth 2 (two) times daily with a meal., Disp: 120 tablet, Rfl: 3   Vitamin D, Ergocalciferol, (DRISDOL) 1.25 MG (50000 UNIT) CAPS capsule, Take 1 capsule (50,000 Units total) by mouth every 7 (seven) days., Disp: 12 capsule, Rfl: 0  Allergies  Allergen Reactions   Penicillins Hives and Nausea And Vomiting    Has patient had a PCN reaction causing immediate rash, facial/tongue/throat swelling, SOB or lightheadedness with hypotension: no Has patient had a PCN reaction causing severe rash involving mucus membranes or skin necrosis: unknown Has patient had a PCN reaction that required hospitalization unknown Has patient had a PCN reaction occurring within  the last 10 years: unknown If all of the above answers are "NO", then may proceed with Cephalosporin use.     Objective:   Ht _0  (1.626 m)   Wt 280 lb (127 kg) Comment: pt reported  BMI 48.06 kg/m   AAOx3, NAD NCAT, EOMI No obvious CN deficits Coloring WNL Pt is able to speak clearly, coherently without shortness of breath or increased work of breathing.  Thought process is linear.  Mood is appropriate.   Assessment and Plan:   Anxiety/Depression- pt isn't sure if Wellbutrin is working b/c she  was recently out of town, forgot her Trazodone, and now feels overwhelmed by the amount of work she has upon return.  She does know that the Venlafaxine is causing upset stomach.  Will wean off Venlafaxine by decreasing to 1 tab x2 weeks and then 1/2 tab x2 weeks and then stopping.  At the same time, we will increase the Wellbutrin to 18m BID.  Pt expressed understanding and is in agreement w/ plan.   Traumatic ecchymosis- new.  Pt reports bruising has resolved and area is no longer painful but lump remains.  Discussed that this could be hematoma, bone bruise, or fat necrosis- all of which are benign.  Reassurance provided and will assess at next in person visit.  Pt expressed understanding and is in agreement w/ plan.    KAnnye Asa MD 07/22/2021

## 2021-09-05 ENCOUNTER — Encounter: Payer: Self-pay | Admitting: Registered Nurse

## 2021-09-05 ENCOUNTER — Telehealth (INDEPENDENT_AMBULATORY_CARE_PROVIDER_SITE_OTHER): Payer: BC Managed Care – PPO | Admitting: Registered Nurse

## 2021-09-05 ENCOUNTER — Other Ambulatory Visit: Payer: Self-pay

## 2021-09-05 VITALS — HR 77

## 2021-09-05 DIAGNOSIS — M79645 Pain in left finger(s): Secondary | ICD-10-CM | POA: Diagnosis not present

## 2021-09-05 DIAGNOSIS — M79644 Pain in right finger(s): Secondary | ICD-10-CM

## 2021-09-05 DIAGNOSIS — Z862 Personal history of diseases of the blood and blood-forming organs and certain disorders involving the immune mechanism: Secondary | ICD-10-CM

## 2021-09-05 DIAGNOSIS — E559 Vitamin D deficiency, unspecified: Secondary | ICD-10-CM | POA: Diagnosis not present

## 2021-09-05 NOTE — Patient Instructions (Addendum)
Ms. Strand -  Doristine Devoid to speak with you. I'm very reassured regarding your symptoms.  Check out Raynaud's Disease if you'd like - if fingers looked like that, let me know, as we can consider further work up, but often it's not all too necessary.  Call our office tomorrow to schedule a Lab Only visit on Wednesday. I'll be in touch with results when they are available.  Thanks!  Rich     If you have lab work done today you will be contacted with your lab results within the next 2 weeks.  If you have not heard from [place] then please contact us. The fastest way to get your results is to register for My Chart.   IF you received an x-ray today, you will receive an invoice from [facility]. Please contact [facility] at [phone] with questions or concerns regarding your invoice.   IF you received labwork today, you will receive an invoice from [facility]. Please contact [facility] at [phone] with questions or concerns regarding your invoice.   Our billing staff will not be able to assist you with questions regarding bills from these companies.  You will be contacted with the lab results as soon as they are available. The fastest way to get your results is to activate your My Chart account. Instructions are located on the last page of this paperwork. If you have not heard from [place] regarding the results in 2 weeks, please contact this office.

## 2021-09-05 NOTE — Progress Notes (Addendum)
Telemedicine Encounter- SOAP NOTE Established Patient  This video encounter was conducted with the patient's (or proxy's) verbal consent via audio telecommunications: yes/no: Yes Patient was instructed to have this encounter in a suitably private space; and to only have persons present to whom they give permission to participate. In addition, patient identity was confirmed by use of name plus two identifiers (DOB and address).  I discussed the limitations, risks, security and privacy concerns of performing an evaluation and management service by telephone and the availability of in person appointments. I also discussed with the patient that there may be a patient responsible charge related to this service. The patient expressed understanding and agreed to proceed.  I spent a total of 12 minutes talking with the patient or their proxy.  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 Pulse has been 43 and o2 is 77    Subjective   Krystal Harris is a 35 y.o. established patient. Video visit today for hand pain  HPI Bilateral hand pain  Last Thursday No heat in school By end of day, hands had blue tint. At that time she had pulse ox available, noted 77% and HR 43 After washing hands with warm water she was able to increase numbers to 80s.  Hands turned back to normal coloration.   No shob, doe, chest pain, headache, fatigue, wheezing, coughing, choking, gasping, recent respiratory illness, melena, abdominal pain, unexpected weight changes, or other symptoms.   Patient Active Problem List   Diagnosis Date Noted   Status post robot-assisted surgical procedure 06/01/2020   S/P Nissen fundoplication (without gastrostomy tube) procedure 06/01/2020   GERD (gastroesophageal reflux disease)  05/01/2019   Knee osteoarthritis 09/12/2017   Morbid obesity ([place]) 08/13/2017   Anxiety 04/11/2017   Insomnia 04/11/2017   Fatty liver 09/27/2016   Physical exam 02/21/2013    Past Medical History:  Diagnosis Date   Anemia    Anxiety    Chicken pox    Depression    Gallbladder problem    GERD (gastroesophageal reflux disease)    PONV (postoperative nausea and vomiting)    vomiting after surgery gallbladder removal 2016   Vitamin D deficiency     Current Outpatient Medications  Medication Sig Dispense Refill   ALPRAZolam (XANAX) 0.5 MG tablet Take 1 tablet (0.5 mg total) by mouth 2 (two) times daily as needed for anxiety. 30 tablet 1   Ascorbic Acid (VITAMIN C) 500 MG CAPS Take 500 mg by mouth daily.      benzonatate (TESSALON) 100 MG capsule Take 1 capsule (100 mg total) by mouth 3 (three) times daily as needed. 20 capsule 0   buPROPion (WELLBUTRIN) 100 MG tablet Take 1 tablet (100 mg total) by mouth 2 (two) times daily. 60 tablet 3   busPIRone (BUSPAR) 30 MG tablet Take 1 tablet (30 mg total) by mouth in the morning and at bedtime. 60 tablet 3   Cholecalciferol (VITAMIN D3 PO) Take 3,000 Units by mouth daily at 12 noon.      ID NOW COVID-19 KIT TEST AS DIRECTED TODAY     traZODone (DESYREL) 100 MG tablet TAKE 1 TABLET BY MOUTH EVERYDAY AT BEDTIME 30 tablet 1   venlafaxine (EFFEXOR) 75 MG tablet Take 2 tablets (150  mg total) by mouth 2 (two) times daily with a meal. 120 tablet 3   Vitamin D, Ergocalciferol, (DRISDOL) 1.25 MG (50000 UNIT) CAPS capsule Take 1 capsule (50,000 Units total) by mouth every 7 (seven) days. 12 capsule 0   No current facility-administered medications for this visit.    Allergies  Allergen Reactions   Penicillins Hives and Nausea And Vomiting    Has patient had a PCN reaction causing immediate rash, facial/tongue/throat swelling, SOB or lightheadedness with hypotension: no Has patient had a PCN reaction causing severe rash involving mucus membranes or  skin necrosis: unknown Has patient had a PCN reaction that required hospitalization unknown Has patient had a PCN reaction occurring within the last 10 years: unknown If all of the above answers are "NO", then may proceed with Cephalosporin use.     Social History   Socioeconomic History   Marital status: Single    Spouse name: Not on file   Number of children: Not on file   Years of education: Not on file   Highest education level: Not on file  Occupational History   Not on file  Tobacco Use   Smoking status: Never   Smokeless tobacco: Never  Vaping Use   Vaping Use: Never used  Substance and Sexual Activity   Alcohol use: Yes    Comment: occasionally   Drug use: No   Sexual activity: Yes  Other Topics Concern   Not on file  Social History Narrative   Not on file   Social Determinants of Health   Financial Resource Strain: Not on file  Food Insecurity: Not on file  Transportation Needs: Not on file  Physical Activity: Not on file  Stress: Not on file  Social Connections: Not on file  Intimate Partner Violence: Not on file    ROS Per hpi   Objective   Vitals as reported by the patient: Today's Vitals   09/05/21 1024  Pulse: 77  SpO2: (!) 43%    [name] was seen today for hand pain.  Diagnoses and all orders for this visit:  Pain in finger of both hands  History of anemia -     CBC with Differential/Platelet; Future -     Iron, TIBC and Ferritin Panel; Future  Vitamin D deficiency -     Vitamin D (25 hydroxy); Future   PLAN Reassured given resolution of symptoms and no other worrisome signs. Suspect false low reading from pulse ox Will collect future labs to check on anemia and vitamin d deficiency. Will follow up as warranted Patient encouraged to call clinic with any questions, comments, or concerns.  I discussed the assessment and treatment plan with the patient. The patient was provided an opportunity to ask questions and all were answered.  The patient agreed with the plan and demonstrated an understanding of the instructions.   The patient was advised to call back or seek an in-person evaluation if the symptoms worsen or if the condition fails to improve as anticipated.  I provided 12 minutes of face-to-face time during this encounter.  Maximiano Coss, NP

## 2021-09-07 ENCOUNTER — Other Ambulatory Visit (INDEPENDENT_AMBULATORY_CARE_PROVIDER_SITE_OTHER): Payer: BC Managed Care – PPO

## 2021-09-07 DIAGNOSIS — Z862 Personal history of diseases of the blood and blood-forming organs and certain disorders involving the immune mechanism: Secondary | ICD-10-CM

## 2021-09-07 DIAGNOSIS — E559 Vitamin D deficiency, unspecified: Secondary | ICD-10-CM

## 2021-09-07 LAB — CBC WITH DIFFERENTIAL/PLATELET
Basophils Absolute: 0.1 10*3/uL (ref 0.0–0.1)
Basophils Relative: 1.1 % (ref 0.0–3.0)
Eosinophils Absolute: 0.1 10*3/uL (ref 0.0–0.7)
Eosinophils Relative: 2.3 % (ref 0.0–5.0)
HCT: 32.3 % — ABNORMAL LOW (ref 36.0–46.0)
Hemoglobin: 10.2 g/dL — ABNORMAL LOW (ref 12.0–15.0)
Lymphocytes Relative: 15.2 % (ref 12.0–46.0)
Lymphs Abs: 0.9 10*3/uL (ref 0.7–4.0)
MCHC: 31.5 g/dL (ref 30.0–36.0)
MCV: 79.5 fl (ref 78.0–100.0)
Monocytes Absolute: 0.4 10*3/uL (ref 0.1–1.0)
Monocytes Relative: 7.2 % (ref 3.0–12.0)
Neutro Abs: 4.3 10*3/uL (ref 1.4–7.7)
Neutrophils Relative %: 74.2 % (ref 43.0–77.0)
Platelets: 370 10*3/uL (ref 150.0–400.0)
RBC: 4.06 Mil/uL (ref 3.87–5.11)
RDW: 16.3 % — ABNORMAL HIGH (ref 11.5–15.5)
WBC: 5.8 10*3/uL (ref 4.0–10.5)

## 2021-09-07 LAB — VITAMIN D 25 HYDROXY (VIT D DEFICIENCY, FRACTURES): VITD: 20.49 ng/mL — ABNORMAL LOW (ref 30.00–100.00)

## 2021-09-08 LAB — IRON,TIBC AND FERRITIN PANEL
%SAT: 11 % (calc) — ABNORMAL LOW (ref 16–45)
Ferritin: 10 ng/mL — ABNORMAL LOW (ref 16–154)
Iron: 36 ug/dL — ABNORMAL LOW (ref 40–190)
TIBC: 331 mcg/dL (calc) (ref 250–450)

## 2021-09-12 ENCOUNTER — Other Ambulatory Visit: Payer: Self-pay | Admitting: Registered Nurse

## 2021-09-12 DIAGNOSIS — D509 Iron deficiency anemia, unspecified: Secondary | ICD-10-CM | POA: Insufficient documentation

## 2021-09-12 DIAGNOSIS — E559 Vitamin D deficiency, unspecified: Secondary | ICD-10-CM | POA: Insufficient documentation

## 2021-09-12 MED ORDER — VITAMIN D (ERGOCALCIFEROL) 1.25 MG (50000 UNIT) PO CAPS: 50000.0000 [IU] | ORAL_CAPSULE | ORAL | 0 refills | Status: DC

## 2021-09-12 MED ORDER — IRON (FERROUS SULFATE) 325 (65 FE) MG PO TABS: 325.0000 mg | ORAL_TABLET | Freq: Every day | ORAL | 1 refills | Status: DC

## 2021-10-22 ENCOUNTER — Other Ambulatory Visit: Payer: Self-pay | Admitting: Family Medicine

## 2021-10-22 DIAGNOSIS — G4700 Insomnia, unspecified: Secondary | ICD-10-CM

## 2021-10-24 NOTE — Telephone Encounter (Signed)
Patient is requesting a refill of the following medications: Requested Prescriptions   Pending Prescriptions Disp Refills   traZODone (DESYREL) 100 MG tablet [Pharmacy Med Name: TRAZODONE 100 MG TABLET] 60 tablet     Sig: TAKE 1 TABLET BY MOUTH EVERYDAY AT BEDTIME    Date of patient request: 10/22/21 Last office visit: 09/05/21 Date of last refill: 07/15/21 Last refill amount: 30 x1R

## 2021-10-26 ENCOUNTER — Encounter: Payer: Self-pay | Admitting: Family Medicine

## 2021-10-26 ENCOUNTER — Telehealth: Payer: BC Managed Care – PPO | Admitting: Family Medicine

## 2021-10-26 DIAGNOSIS — R110 Nausea: Secondary | ICD-10-CM

## 2021-10-26 DIAGNOSIS — F32A Depression, unspecified: Secondary | ICD-10-CM

## 2021-10-26 DIAGNOSIS — F419 Anxiety disorder, unspecified: Secondary | ICD-10-CM

## 2021-10-26 DIAGNOSIS — R1013 Epigastric pain: Secondary | ICD-10-CM | POA: Diagnosis not present

## 2021-10-26 MED ORDER — ALPRAZOLAM 0.5 MG PO TABS: 0.5000 mg | ORAL_TABLET | Freq: Two times a day (BID) | ORAL | 1 refills | Status: DC | PRN

## 2021-10-26 MED ORDER — ONDANSETRON HCL 4 MG PO TABS: 4.0000 mg | ORAL_TABLET | Freq: Three times a day (TID) | ORAL | 1 refills | Status: DC | PRN

## 2021-10-26 MED ORDER — PANTOPRAZOLE SODIUM 40 MG PO TBEC: 40.0000 mg | DELAYED_RELEASE_TABLET | Freq: Every day | ORAL | 3 refills | Status: DC

## 2021-10-26 NOTE — Progress Notes (Signed)
Virtual Visit via Video   I connected with patient on 10/26/21 at 11:30 AM EST by a video enabled telemedicine application and verified that I am speaking with the correct person using two identifiers.  Location patient: Home Location provider: [name], Office Persons participating in the virtual visit: Patient, Provider, [place] [name]  I discussed the limitations of evaluation and management by telemedicine and the availability of in person appointments. The patient expressed understanding and agreed to proceed.  Subjective:   HPI:   Anxiety/Depression- chronic problem.  At this time she is on Wellbutrin 158m BID.  She was previously on Buspar 380mBID and Venlafaxine 15036mID.  At last visit in October the Venlafaxine was causing upset stomach so we weaned off.  Pt reports she had a week when she was 'really down and depressed' the week after Christmas.  Family did not come for her Masters Degree on 12/16 and she did not hear from them over Christmas.  She was able to meet w/ therapist which is helping.  She notes that her appetite has been impacted by mood- decreased.  Is eating only 1 meal daily.  States she will wake up feeling full and nauseous.  She reports the iron supplement 'really messed up my stomach'.  No longer taking iron pills.  ROS:   See pertinent positives and negatives per HPI.  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 tube) procedure 06/01/2020   GERD (gastroesophageal reflux disease) 05/01/2019   Knee osteoarthritis 09/12/2017   Morbid obesity ([facility]/29/2018   Anxiety 04/11/2017   Insomnia 04/11/2017   Fatty liver 09/27/2016   Physical exam 02/21/2013    Social History   Tobacco Use   Smoking status: Never   Smokeless tobacco: Never  Substance Use  Topics   Alcohol use: Yes    Comment: occasionally    Current Outpatient Medications:    ALPRAZolam (XANAX) 0.5 MG tablet, Take 1 tablet (0.5 mg total) by mouth 2 (two) times daily as needed for anxiety., Disp: 30 tablet, Rfl: 1   Ascorbic Acid (VITAMIN C) 500 MG CAPS, Take 500 mg by mouth daily. , Disp: , Rfl:    buPROPion (WELLBUTRIN) 100 MG tablet, Take 1 tablet (100 mg total) by mouth 2 (two) times daily., Disp: 60 tablet, Rfl: 3   Cholecalciferol (VITAMIN D3 PO), Take 3,000 Units by mouth daily at 12 noon. , Disp: , Rfl:    traZODone (DESYREL) 100 MG tablet, TAKE 1 TABLET BY MOUTH EVERYDAY AT BEDTIME, Disp: 60 tablet, Rfl: 0   Vitamin D, Ergocalciferol, (DRISDOL) 1.25 MG (50000 UNIT) CAPS capsule, Take 1 capsule (50,000 Units total) by mouth every 7 (seven) days., Disp: 12 capsule, Rfl: 0   benzonatate (TESSALON) 100 MG capsule, Take 1 capsule (100 mg total) by mouth 3 (three) times daily as needed. (Patient not taking: Reported on 10/26/2021), Disp: 20 capsule, Rfl: 0   busPIRone (BUSPAR) 30 MG tablet, Take 1 tablet (30 mg total) by mouth in the morning and at bedtime. (Patient not taking: Reported on 10/26/2021), Disp: 60 tablet, Rfl: 3   ID NOW COVID-19 KIT, TEST AS DIRECTED TODAY (Patient not taking: Reported on 10/26/2021), Disp: , Rfl:    Iron, Ferrous Sulfate, 325 (65 Fe) MG TABS, Take 325 mg by mouth daily. (Patient not taking: Reported on 10/26/2021),  Disp: 90 tablet, Rfl: 1   venlafaxine (EFFEXOR) 75 MG tablet, Take 2 tablets (150 mg total) by mouth 2 (two) times daily with a meal. (Patient not taking: Reported on 10/26/2021), Disp: 120 tablet, Rfl: 3  Allergies  Allergen Reactions   Penicillins Hives and Nausea And Vomiting    Has patient had a PCN reaction causing immediate rash, facial/tongue/throat swelling, SOB or lightheadedness with hypotension: no Has patient had a PCN reaction causing severe rash involving mucus membranes or skin necrosis: unknown Has patient had a PCN  reaction that required hospitalization unknown Has patient had a PCN reaction occurring within the last 10 years: unknown If all of the above answers are "NO", then may proceed with Cephalosporin use.     Objective:   There were no vitals taken for this visit. AAOx3, NAD NCAT, EOMI No obvious CN deficits Coloring WNL Pt is able to speak clearly, coherently without shortness of breath or increased work of breathing.  Thought process is linear.  Mood is appropriate.   Assessment and Plan:   Anxiety/Depression- deteriorated.  Pt had a hard time since family did not come for her Masters Degree and then did not communicate for the holidays.  This caused severely decreased appetite.  I suspect the decreased appetite has flared her acid issues and has resulted in some degree of gastritis (see below).  She is working through her feelings in regular therapy and is aware that now isn't the time to change mood meds since she is already having stomach issues.  Will refill Alprazolam and continue to follow.  Nausea/Epigastric pain- deteriorated.  Pt was doing well and had not had issues since her surgery but as she stopped eating, sxs returned.  I suspect that her decrease appetite/intake caused unopposed acid which led to some degree of gastritis which is now responsible for pain/nausea.  Zofran as needed for nausea.  Restart PPI to decrease acid production.  If no improvement in 1-2 weeks, will refer back to GI.  Pt expressed understanding and is in agreement w/ plan.    Krystal Asa, MD 10/26/2021

## 2021-11-09 ENCOUNTER — Encounter: Payer: Self-pay | Admitting: Family Medicine

## 2021-11-09 DIAGNOSIS — R1013 Epigastric pain: Secondary | ICD-10-CM

## 2021-11-09 DIAGNOSIS — R110 Nausea: Secondary | ICD-10-CM

## 2021-11-11 ENCOUNTER — Ambulatory Visit: Payer: BC Managed Care – PPO | Admitting: Family Medicine

## 2021-11-11 ENCOUNTER — Encounter: Payer: Self-pay | Admitting: Family Medicine

## 2021-11-11 VITALS — BP 124/80 | HR 111 | Temp 97.7°F | Resp 17 | Wt 282.0 lb

## 2021-11-11 DIAGNOSIS — G43009 Migraine without aura, not intractable, without status migrainosus: Secondary | ICD-10-CM | POA: Diagnosis not present

## 2021-11-11 DIAGNOSIS — F419 Anxiety disorder, unspecified: Secondary | ICD-10-CM | POA: Diagnosis not present

## 2021-11-11 MED ORDER — PROMETHAZINE HCL 25 MG PO TABS: 25.0000 mg | ORAL_TABLET | Freq: Three times a day (TID) | ORAL | 0 refills | Status: DC | PRN

## 2021-11-11 MED ORDER — SUMATRIPTAN SUCCINATE 50 MG PO TABS: 50.0000 mg | ORAL_TABLET | ORAL | 0 refills | Status: DC | PRN

## 2021-11-11 NOTE — Assessment & Plan Note (Signed)
Ongoing issue.  BMI now 48.41  Not getting regular exercise nor following a particular diet.  Encouraged her to focus on healthy, low carb diet and regular physical activity.  Will check labs to risk stratify.

## 2021-11-11 NOTE — Patient Instructions (Signed)
Follow up as needed or as scheduled We'll notify you of your lab results and make any changes if needed At the first sign of bad headache, take 2 Excedrin migraine.  If no improvement in 20-30 minutes, take a Sumatriptan.  IF you are nauseous, also take a promethazine Make sure you are staying well hydrated! If you start to have more frequent headaches, try and track when they occur and any possible triggers Continue to work on healthy diet and regular exercise- you can do it! Call with any questions or concerns Have a great weekend!

## 2021-11-11 NOTE — Assessment & Plan Note (Signed)
Ongoing issue for pt.  Her current anxiety is centered on her health.  Provided reassurance that she keeps up w/ her physicals, her lab work, her GYN appts, and she just recently had an endoscopy.  No need for CT scan at this time unless determined by GI at her upcoming visit.  Pt expressed understanding and is in agreement w/ plan.

## 2021-11-11 NOTE — Progress Notes (Signed)
Subjective:    Patient ID: Krystal Harris, female    DOB: Jun 21, 1986, 36 y.o.   MRN: [id number]  HPI Dizziness- sxs started 3 days ago when she got dizzy walking down the hall at school.  Later that night her head started pounding.  Then on Wednesday she stood up and had another dizzy spell w/ severe headache.  HA was behind L eye.  Pt was having photo and phonophobia.  No history of migraines.  + nausea.  Denies visual changes.  Sxs started towards end of her menstrual cycle.  Sxs improved w/ excedrin migraine.  Has not had dizziness since Wednesday.  Pt did not have caffeine on Monday or Tuesday.  Pt reports good water intake.  Anxiety about health- pt is very concerned that people of young ages are dying around her.  Her recent HAs caused her increased concern.  She is continually worried about her GI issues.  Isn't sure if she should have a CT scan.  Obesity- pt's BMI 48.41.  not currently exercising.  Not following a particular diet.  Due for labs.  Review of Systems For ROS see HPI   This visit occurred during the SARS-CoV-2 public health emergency.  Safety protocols were in place, including screening questions prior to the visit, additional usage of staff PPE, and extensive cleaning of exam room while observing appropriate contact time as indicated for disinfecting solutions.      Objective:   Physical Exam Vitals reviewed.  Constitutional:      General: She is not in acute distress.    Appearance: Normal appearance. She is well-developed. She is obese. She is not ill-appearing.  HENT:     Head: Normocephalic and atraumatic.     Right Ear: Tympanic membrane and ear canal normal.     Left Ear: Tympanic membrane and ear canal normal.  Eyes:     Conjunctiva/sclera: Conjunctivae normal.     Pupils: Pupils are equal, round, and reactive to light.  Cardiovascular:     Rate and Rhythm: Normal rate and regular rhythm.     Heart sounds: Normal heart sounds.  Pulmonary:     Effort:  Pulmonary effort is normal. No respiratory distress.     Breath sounds: Normal breath sounds. No wheezing or rales.  Musculoskeletal:        General: No tenderness.     Cervical back: Normal range of motion and neck supple.  Lymphadenopathy:     Cervical: No cervical adenopathy.  Skin:    General: Skin is warm and dry.  Neurological:     General: No focal deficit present.     Mental Status: She is alert and oriented to person, place, and time.     Cranial Nerves: No cranial nerve deficit.     Coordination: Coordination normal.     Deep Tendon Reflexes: Reflexes are normal and symmetric.  Psychiatric:        Behavior: Behavior normal.        Thought Content: Thought content normal.        Judgment: Judgment normal.          Assessment & Plan:  Migraine- new.  Pt states she has never had a migraine before but her sxs this week certainly seem to qualify.  + frontal HA behind her L eye, nausea, dizziness, photo and phonophobia.  Sxs improved w/ caffeine and excedrin migraine.  Suspect this was multifactorial- hormonal as she was on her menstrual cycle, caffeine withdrawal, stress.  Pt  to take Excedrin migraine at first sign of severe headache.  If no improvement after 20-30 minutes, take Imitrex and promethazine.  Encouraged hydration, stress management, and HA log to determine if there's a pattern or a trigger.  Pt expressed understanding and is in agreement w/ plan.

## 2021-11-12 LAB — HEPATIC FUNCTION PANEL
AG Ratio: 1 (calc) (ref 1.0–2.5)
ALT: 8 U/L (ref 6–29)
AST: 13 U/L (ref 10–30)
Albumin: 3.9 g/dL (ref 3.6–5.1)
Alkaline phosphatase (APISO): 87 U/L (ref 31–125)
Bilirubin, Direct: 0 mg/dL (ref 0.0–0.2)
Globulin: 3.8 g/dL (calc) — ABNORMAL HIGH (ref 1.9–3.7)
Indirect Bilirubin: 0.2 mg/dL (calc) (ref 0.2–1.2)
Total Bilirubin: 0.2 mg/dL (ref 0.2–1.2)
Total Protein: 7.7 g/dL (ref 6.1–8.1)

## 2021-11-12 LAB — BASIC METABOLIC PANEL
BUN: 9 mg/dL (ref 7–25)
CO2: 26 mmol/L (ref 20–32)
Calcium: 9.3 mg/dL (ref 8.6–10.2)
Chloride: 106 mmol/L (ref 98–110)
Creat: 0.7 mg/dL (ref 0.50–0.97)
Glucose, Bld: 75 mg/dL (ref 65–99)
Potassium: 3.6 mmol/L (ref 3.5–5.3)
Sodium: 138 mmol/L (ref 135–146)

## 2021-11-12 LAB — CBC WITH DIFFERENTIAL/PLATELET
Absolute Monocytes: 536 cells/uL (ref 200–950)
Basophils Absolute: 34 cells/uL (ref 0–200)
Basophils Relative: 0.5 %
Eosinophils Absolute: 335 cells/uL (ref 15–500)
Eosinophils Relative: 5 %
HCT: 33.5 % — ABNORMAL LOW (ref 35.0–45.0)
Hemoglobin: 10.5 g/dL — ABNORMAL LOW (ref 11.7–15.5)
Lymphs Abs: 1320 cells/uL (ref 850–3900)
MCH: 24.7 pg — ABNORMAL LOW (ref 27.0–33.0)
MCHC: 31.3 g/dL — ABNORMAL LOW (ref 32.0–36.0)
MCV: 78.8 fL — ABNORMAL LOW (ref 80.0–100.0)
MPV: 9.8 fL (ref 7.5–12.5)
Monocytes Relative: 8 %
Neutro Abs: 4476 cells/uL (ref 1500–7800)
Neutrophils Relative %: 66.8 %
Platelets: 425 10*3/uL — ABNORMAL HIGH (ref 140–400)
RBC: 4.25 10*6/uL (ref 3.80–5.10)
RDW: 15.3 % — ABNORMAL HIGH (ref 11.0–15.0)
Total Lymphocyte: 19.7 %
WBC: 6.7 10*3/uL (ref 3.8–10.8)

## 2021-11-12 LAB — HEMOGLOBIN A1C
Hgb A1c MFr Bld: 5.6 % of total Hgb (ref <5.7)
Mean Plasma Glucose: 114 mg/dL
eAG (mmol/L): 6.3 mmol/L

## 2021-11-12 LAB — LIPID PANEL
Cholesterol: 160 mg/dL (ref <200)
HDL: 53 mg/dL (ref >=50)
LDL Cholesterol (Calc): 90 mg/dL (calc)
Non-HDL Cholesterol (Calc): 107 mg/dL (calc) (ref <130)
Total CHOL/HDL Ratio: 3 (calc) (ref <5.0)
Triglycerides: 76 mg/dL (ref <150)

## 2021-11-12 LAB — VITAMIN D 25 HYDROXY (VIT D DEFICIENCY, FRACTURES): Vit D, 25-Hydroxy: 29 ng/mL — ABNORMAL LOW (ref 30–100)

## 2021-11-12 LAB — TSH: TSH: 0.68 mIU/L

## 2021-11-15 ENCOUNTER — Telehealth: Payer: Self-pay | Admitting: Gastroenterology

## 2021-11-15 NOTE — Telephone Encounter (Signed)
Hi Dr. Tarri Glenn,  Would you accept this transfer?

## 2021-11-15 NOTE — Telephone Encounter (Signed)
Hi Dr. Ardis Hughs,  We received a referral from patients PCP to be seen for epigastric pain and nausea. Upon reviewing records in [place] she was previously your patient in 2020 and 2021. It looks like she went to [facility] and requested a transfer back to you in 2021. The patient is requesting an updated transfer of care from you over to Dr. Tarri Glenn (D.O.D) to continue her care.   Please advise on scheduling. Thanks

## 2021-12-22 LAB — HM COLONOSCOPY

## 2021-12-23 ENCOUNTER — Encounter: Payer: Self-pay | Admitting: Family Medicine

## 2022-01-09 ENCOUNTER — Other Ambulatory Visit: Payer: Self-pay | Admitting: Family Medicine

## 2022-01-09 DIAGNOSIS — G4700 Insomnia, unspecified: Secondary | ICD-10-CM

## 2022-03-27 ENCOUNTER — Other Ambulatory Visit: Payer: Self-pay | Admitting: Family Medicine

## 2022-03-27 DIAGNOSIS — G4700 Insomnia, unspecified: Secondary | ICD-10-CM

## 2022-04-18 ENCOUNTER — Other Ambulatory Visit: Payer: Self-pay | Admitting: Obstetrics and Gynecology

## 2022-04-27 ENCOUNTER — Encounter (HOSPITAL_BASED_OUTPATIENT_CLINIC_OR_DEPARTMENT_OTHER): Payer: Self-pay | Admitting: Obstetrics and Gynecology

## 2022-04-27 NOTE — Progress Notes (Signed)
Spoke w/ via phone for pre-op interview--- Krystal Harris Lab needs dos---- CBC and UPT              Lab results------ COVID test -----patient states asymptomatic no test needed Arrive at -------0630 NPO after MN NO Solid Food.  Med rec completed Medications to take morning of surgery ----- Xanax, Wellbutrin, BCP and Imitrex PRN. Diabetic medication ----- Patient instructed no nail polish to be worn day of surgery Patient instructed to bring photo id and insurance card day of surgery Patient aware to have Driver (ride ) / caregiver   [name]  for 24 hours after surgery  Patient Special Instructions ----- Pre-Op special Istructions ----- Patient verbalized understanding of instructions that were given at this phone interview. Patient denies shortness of breath, chest pain, fever, cough at this phone interview.

## 2022-05-05 ENCOUNTER — Ambulatory Visit (HOSPITAL_BASED_OUTPATIENT_CLINIC_OR_DEPARTMENT_OTHER): Payer: BC Managed Care – PPO | Admitting: Anesthesiology

## 2022-05-05 ENCOUNTER — Other Ambulatory Visit: Payer: Self-pay

## 2022-05-05 ENCOUNTER — Ambulatory Visit (HOSPITAL_BASED_OUTPATIENT_CLINIC_OR_DEPARTMENT_OTHER)
Admission: RE | Admit: 2022-05-05 | Discharge: 2022-05-05 | Disposition: A | Discharge status: Home / Self Care | Payer: BC Managed Care – PPO | Attending: Obstetrics and Gynecology | Admitting: Obstetrics and Gynecology

## 2022-05-05 ENCOUNTER — Encounter (HOSPITAL_BASED_OUTPATIENT_CLINIC_OR_DEPARTMENT_OTHER): Payer: Self-pay | Admitting: Obstetrics and Gynecology

## 2022-05-05 ENCOUNTER — Encounter (HOSPITAL_BASED_OUTPATIENT_CLINIC_OR_DEPARTMENT_OTHER)
Admission: RE | Disposition: A | Discharge status: Home / Self Care | Payer: Self-pay | Source: Home / Self Care | Attending: Obstetrics and Gynecology

## 2022-05-05 DIAGNOSIS — F419 Anxiety disorder, unspecified: Secondary | ICD-10-CM | POA: Diagnosis not present

## 2022-05-05 DIAGNOSIS — F32A Depression, unspecified: Secondary | ICD-10-CM | POA: Insufficient documentation

## 2022-05-05 DIAGNOSIS — K219 Gastro-esophageal reflux disease without esophagitis: Secondary | ICD-10-CM | POA: Diagnosis not present

## 2022-05-05 DIAGNOSIS — D250 Submucous leiomyoma of uterus: Secondary | ICD-10-CM | POA: Insufficient documentation

## 2022-05-05 DIAGNOSIS — N938 Other specified abnormal uterine and vaginal bleeding: Secondary | ICD-10-CM | POA: Insufficient documentation

## 2022-05-05 HISTORY — PX: DILATATION & CURETTAGE/HYSTEROSCOPY WITH MYOSURE: SHX6511

## 2022-05-05 LAB — CBC
HCT: 30.9 % — ABNORMAL LOW (ref 36.0–46.0)
Hemoglobin: 9.7 g/dL — ABNORMAL LOW (ref 12.0–15.0)
MCH: 25.1 pg — ABNORMAL LOW (ref 26.0–34.0)
MCHC: 31.4 g/dL (ref 30.0–36.0)
MCV: 80.1 fL (ref 80.0–100.0)
Platelets: 424 10*3/uL — ABNORMAL HIGH (ref 150–400)
RBC: 3.86 MIL/uL — ABNORMAL LOW (ref 3.87–5.11)
RDW: 16.6 % — ABNORMAL HIGH (ref 11.5–15.5)
WBC: 6.6 10*3/uL (ref 4.0–10.5)
nRBC: 0 % (ref 0.0–0.2)

## 2022-05-05 LAB — POCT PREGNANCY, URINE: Preg Test, Ur: NEGATIVE

## 2022-05-05 SURGERY — DILATATION & CURETTAGE/HYSTEROSCOPY WITH MYOSURE
Anesthesia: General

## 2022-05-05 MED ORDER — MIDAZOLAM HCL 2 MG/2ML IJ SOLN
INTRAMUSCULAR | Status: AC
  Filled 2022-05-05: qty 2

## 2022-05-05 MED ORDER — IBUPROFEN 800 MG PO TABS: 800.0000 mg | ORAL_TABLET | Freq: Three times a day (TID) | ORAL | 7 refills | Status: DC | PRN

## 2022-05-05 MED ORDER — SCOPOLAMINE 1 MG/3DAYS TD PT72
MEDICATED_PATCH | TRANSDERMAL | Status: AC
  Filled 2022-05-05: qty 1

## 2022-05-05 MED ORDER — DIPHENHYDRAMINE HCL 50 MG/ML IJ SOLN
INTRAMUSCULAR | Status: DC | PRN
  Administered 2022-05-05: 12.5 mg via INTRAVENOUS

## 2022-05-05 MED ORDER — MEPERIDINE HCL 25 MG/ML IJ SOLN: 6.2500 mg | INTRAMUSCULAR | Status: DC | PRN

## 2022-05-05 MED ORDER — ACETAMINOPHEN 500 MG PO TABS
1000.0000 mg | ORAL_TABLET | Freq: Once | ORAL | Status: AC
  Administered 2022-05-05: 1000 mg via ORAL

## 2022-05-05 MED ORDER — OXYCODONE-ACETAMINOPHEN 5-325 MG PO TABS
1.0000 | ORAL_TABLET | Freq: Four times a day (QID) | ORAL | 0 refills | Status: AC | PRN
Start: 2022-05-05 — End: 2022-05-10

## 2022-05-05 MED ORDER — PROPOFOL 10 MG/ML IV BOLUS
INTRAVENOUS | Status: DC | PRN
  Administered 2022-05-05: 250 mg via INTRAVENOUS

## 2022-05-05 MED ORDER — ONDANSETRON HCL 4 MG/2ML IJ SOLN: 4.0000 mg | Freq: Once | INTRAMUSCULAR | Status: DC | PRN

## 2022-05-05 MED ORDER — APREPITANT 40 MG PO CAPS
ORAL_CAPSULE | ORAL | Status: AC
  Filled 2022-05-05: qty 1

## 2022-05-05 MED ORDER — HYDROMORPHONE HCL 1 MG/ML IJ SOLN
INTRAMUSCULAR | Status: AC
  Filled 2022-05-05: qty 1

## 2022-05-05 MED ORDER — APREPITANT 40 MG PO CAPS
40.0000 mg | ORAL_CAPSULE | Freq: Once | ORAL | Status: AC
  Administered 2022-05-05: 40 mg via ORAL

## 2022-05-05 MED ORDER — HYDROMORPHONE HCL 1 MG/ML IJ SOLN
0.2500 mg | INTRAMUSCULAR | Status: DC | PRN
  Administered 2022-05-05 (×2): 0.5 mg via INTRAVENOUS

## 2022-05-05 MED ORDER — OXYCODONE HCL 5 MG/5ML PO SOLN: 5.0000 mg | Freq: Once | ORAL | Status: AC | PRN

## 2022-05-05 MED ORDER — OXYCODONE HCL 5 MG PO TABS
ORAL_TABLET | ORAL | Status: AC
  Filled 2022-05-05: qty 1

## 2022-05-05 MED ORDER — MIDAZOLAM HCL 2 MG/2ML IJ SOLN
INTRAMUSCULAR | Status: DC | PRN
  Administered 2022-05-05: 2 mg via INTRAVENOUS

## 2022-05-05 MED ORDER — KETOROLAC TROMETHAMINE 30 MG/ML IJ SOLN
INTRAMUSCULAR | Status: DC | PRN
  Administered 2022-05-05: 30 mg via INTRAVENOUS

## 2022-05-05 MED ORDER — ONDANSETRON HCL 4 MG/2ML IJ SOLN
INTRAMUSCULAR | Status: DC | PRN
  Administered 2022-05-05: 4 mg via INTRAVENOUS

## 2022-05-05 MED ORDER — FENTANYL CITRATE (PF) 100 MCG/2ML IJ SOLN
INTRAMUSCULAR | Status: AC
  Filled 2022-05-05: qty 2

## 2022-05-05 MED ORDER — OXYCODONE HCL 5 MG PO TABS
5.0000 mg | ORAL_TABLET | Freq: Once | ORAL | Status: AC | PRN
  Administered 2022-05-05: 5 mg via ORAL

## 2022-05-05 MED ORDER — AMISULPRIDE (ANTIEMETIC) 5 MG/2ML IV SOLN: 10.0000 mg | Freq: Once | INTRAVENOUS | Status: DC | PRN

## 2022-05-05 MED ORDER — LIDOCAINE 2% (20 MG/ML) 5 ML SYRINGE
INTRAMUSCULAR | Status: DC | PRN
  Administered 2022-05-05: 60 mg via INTRAVENOUS

## 2022-05-05 MED ORDER — POVIDONE-IODINE 10 % EX SWAB: 2.0000 | Freq: Once | CUTANEOUS | Status: DC

## 2022-05-05 MED ORDER — PROPOFOL 500 MG/50ML IV EMUL
INTRAVENOUS | Status: DC | PRN
  Administered 2022-05-05: 125 ug/kg/min via INTRAVENOUS

## 2022-05-05 MED ORDER — LACTATED RINGERS IV SOLN: INTRAVENOUS | Status: DC

## 2022-05-05 MED ORDER — DEXAMETHASONE SODIUM PHOSPHATE 10 MG/ML IJ SOLN
INTRAMUSCULAR | Status: DC | PRN
  Administered 2022-05-05: 10 mg via INTRAVENOUS

## 2022-05-05 MED ORDER — FENTANYL CITRATE (PF) 250 MCG/5ML IJ SOLN
INTRAMUSCULAR | Status: DC | PRN
  Administered 2022-05-05 (×4): 25 ug via INTRAVENOUS
  Administered 2022-05-05: 100 ug via INTRAVENOUS

## 2022-05-05 MED ORDER — PROPOFOL 10 MG/ML IV BOLUS
INTRAVENOUS | Status: AC
  Filled 2022-05-05: qty 20

## 2022-05-05 MED ORDER — ACETAMINOPHEN 500 MG PO TABS
ORAL_TABLET | ORAL | Status: AC
  Filled 2022-05-05: qty 2

## 2022-05-05 MED ORDER — KETOROLAC TROMETHAMINE 30 MG/ML IJ SOLN: 30.0000 mg | Freq: Once | INTRAMUSCULAR | Status: DC | PRN

## 2022-05-05 MED ORDER — DIPHENHYDRAMINE HCL 50 MG/ML IJ SOLN
INTRAMUSCULAR | Status: AC
  Filled 2022-05-05: qty 1

## 2022-05-05 MED ORDER — SCOPOLAMINE 1 MG/3DAYS TD PT72
1.0000 | MEDICATED_PATCH | TRANSDERMAL | Status: DC
  Administered 2022-05-05: 1.5 mg via TRANSDERMAL

## 2022-05-05 MED ORDER — SODIUM CHLORIDE 0.9 % IR SOLN
Status: DC | PRN
  Administered 2022-05-05 (×3): 3000 mL

## 2022-05-05 SURGICAL SUPPLY — 23 items
CATH ROBINSON RED A/P 16FR (CATHETERS) IMPLANT
DEVICE MYOSURE LITE (MISCELLANEOUS) IMPLANT
DEVICE MYOSURE REACH (MISCELLANEOUS) IMPLANT
DILATOR CANAL MILEX (MISCELLANEOUS) IMPLANT
DRSG TELFA 3X8 NADH (GAUZE/BANDAGES/DRESSINGS) ×2 IMPLANT
GAUZE 4X4 16PLY ~~LOC~~+RFID DBL (SPONGE) ×2 IMPLANT
GLOVE BIOGEL PI IND STRL 7.0 (GLOVE) ×1 IMPLANT
GLOVE BIOGEL PI INDICATOR 7.0 (GLOVE) ×1
GLOVE ECLIPSE 6.5 STRL STRAW (GLOVE) ×2 IMPLANT
GOWN STRL REUS W/TWL LRG LVL3 (GOWN DISPOSABLE) ×2 IMPLANT
IV NS IRRIG 3000ML ARTHROMATIC (IV SOLUTION) ×4 IMPLANT
KIT PROCEDURE FLUENT (KITS) ×2 IMPLANT
KIT TURNOVER CYSTO (KITS) ×2 IMPLANT
MYOSURE XL FIBROID (MISCELLANEOUS) ×2
PACK VAGINAL MINOR WOMEN LF (CUSTOM PROCEDURE TRAY) ×2 IMPLANT
PAD DRESSING TELFA 3X8 NADH (GAUZE/BANDAGES/DRESSINGS) ×1 IMPLANT
PAD OB MATERNITY 4.3X12.25 (PERSONAL CARE ITEMS) ×2 IMPLANT
PAD PREP 24X48 CUFFED NSTRL (MISCELLANEOUS) ×2 IMPLANT
SEAL CERVICAL OMNI LOK (ABLATOR) IMPLANT
SEAL ROD LENS SCOPE MYOSURE (ABLATOR) ×2 IMPLANT
SYSTEM TISS REMOVAL MYOSURE XL (MISCELLANEOUS) IMPLANT
TOWEL OR 17X26 10 PK STRL BLUE (TOWEL DISPOSABLE) ×2 IMPLANT
WATER STERILE IRR 500ML POUR (IV SOLUTION) ×2 IMPLANT

## 2022-05-05 NOTE — Transfer of Care (Signed)
Immediate Anesthesia Transfer of Care Note  Patient: MISTINA COATNEY  Procedure(s) Performed: [place]  Patient Location: PACU  Anesthesia Type:General  Level of Consciousness: awake, alert  and oriented  Airway & Oxygen Therapy: Patient Spontanous Breathing  Post-op Assessment: Report given to RN and Post -op Vital signs reviewed and stable  Post vital signs: Reviewed and stable  Last Vitals:  Vitals Value Taken Time  BP 151/108 05/05/22 0930  Temp    Pulse    Resp 17 05/05/22 0931  SpO2    Vitals shown include unvalidated device data.  Last Pain:  Vitals:   05/05/22 0724  TempSrc: Oral  PainSc: 2       Patients Stated Pain Goal: 4 (94/71/25 2712)  Complications: No notable events documented.

## 2022-05-05 NOTE — Anesthesia Procedure Notes (Signed)
Procedure Name: LMA Insertion Date/Time: 05/05/2022 8:35 AM  Performed by: Clearnce Sorrel, CRNAPre-anesthesia Checklist: Patient identified, Emergency Drugs available, Suction available and Patient being monitored Patient Re-evaluated:Patient Re-evaluated prior to induction Oxygen Delivery Method: Circle System Utilized Preoxygenation: Pre-oxygenation with 100% oxygen Induction Type: IV induction Ventilation: Mask ventilation without difficulty LMA: LMA inserted LMA Size: 4.0 Number of attempts: 1 Airway Equipment and Method: Bite block Placement Confirmation: positive ETCO2 Tube secured with: Tape Dental Injury: Teeth and Oropharynx as per pre-operative assessment

## 2022-05-05 NOTE — Op Note (Signed)
Krystal Harris, Krystal Harris MEDICAL RECORD NO: [id number] ACCOUNT NO: [id number] DATE OF BIRTH: 10-29-1985 FACILITY: [facility] LOCATION: [facility] PHYSICIAN: Carman Auxier A. Garwin Brothers, MD  Operative Report   DATE OF PROCEDURE: 05/05/2022  PREOPERATIVE DIAGNOSES:  Dysfunctional uterine bleeding and submucosal fibroids.  PROCEDURE:  Diagnostic hysteroscopy, hysteroscopic resection of submucosal fibroids using Myosure, Dilation and Curettage.  POSTOPERATIVE DIAGNOSIS:  dysfunctional uterine bleeding and  submucosal fibroids.  ANESTHESIA:  General.  SURGEON:  Marlyn Rabine A. Garwin Brothers, MD  ASSISTANT:  None.  DESCRIPTION OF PROCEDURE:  Under adequate general anesthesia, the patient was placed in the dorsal lithotomy position.  She was sterilely prepped and draped in usual fashion.  The patient had minimal urine output.  Examination under  anesthesia revealed an anteverted, slightly enlarged uterus, no adnexal masses could be appreciated.  Bivalve speculum was placed in the vagina.  A single-tooth tenaculum was placed on the anterior lip of the cervix.  The cervix was then serially dilated  up to #21 [facility] dilator.  A diagnostic hysteroscope was introduced into the uterine cavity.  On entering the cavity, there was a large posterior fundal submucosal fibroid distorting the anatomy superiorly with the both tubal ostia seen.  There was also a left posterior lower uterine segment also submucosal fibroid in location.  Using the MyoSure XL, both submucosal fibroids were resected as well as the endometrial cavity.  When the resection was felt to be adequate, all instruments  were then removed from the vagina.  Specimen labeled endometrial curettings with fibroid resections, sent to pathology.  ESTIMATED BLOOD LOSS:  5 mL  FLUID DEFICIT:  888 mL  COMPLICATIONS:  None.   The patient tolerated the procedure well and was transferred to recovery room in stable condition.      PAA D: 05/05/2022 9:55:06 am T:  05/05/2022 10:13:00 am  JOB: [id number]/ [id number]

## 2022-05-05 NOTE — Discharge Instructions (Signed)
NO TYLENOL PRODUCTS AFTER 1:00 PM TODAY.

## 2022-05-05 NOTE — Anesthesia Postprocedure Evaluation (Signed)
Anesthesia Post Note  Patient: Krystal Harris  Procedure(s) Performed: [place]     Patient location during evaluation: PACU Anesthesia Type: General Level of consciousness: awake and alert, oriented and patient cooperative Pain management: pain level controlled Vital Signs Assessment: post-procedure vital signs reviewed and stable Respiratory status: spontaneous breathing, nonlabored ventilation and respiratory function stable Cardiovascular status: blood pressure returned to baseline and stable Postop Assessment: no apparent nausea or vomiting Anesthetic complications: no   No notable events documented.  Last Vitals:  Vitals:   05/05/22 0928 05/05/22 0930  BP:  (!) 151/108  Pulse:  (!) 104  Resp:  13  Temp:    SpO2: 98% 100%    Last Pain:  Vitals:   05/05/22 0724  TempSrc: Oral  PainSc: [place]

## 2022-05-05 NOTE — H&P (Signed)
Krystal Harris is an 36 y.o. female. G0 BF presents for surgical management of abnl bleeding due to SM fibroids noted on sonogram. Pt has been on OCP with continued bleeding. She is scheduled for dx hysteroscopy, resection of SM fibroid ,  D&C  Pertinent Gynecological History: Menses: flow is excessive with use of 5 pads or tampons on heaviest days Bleeding: intermenstrual bleeding Contraception: OCP (estrogen/progesterone) DES exposure: denies Blood transfusions: none Sexually transmitted diseases: no past history Previous GYN Procedures:  n/a   Last mammogram:  n./a  Date: n/a Last pap: normal Date: 2022 OB History: G0, P0   Menstrual History: Menarche age: n/a Patient's last menstrual period was 12/28/2021.    Past Medical History:  Diagnosis Date   Anemia    Anxiety    Chicken pox    Depression    Gallbladder problem    GERD (gastroesophageal reflux disease)    PONV (postoperative nausea and vomiting)    vomiting after surgery gallbladder removal 2016   Vitamin D deficiency     Past Surgical History:  Procedure Laterality Date   BRAVO [place] STUDY N/A 01/22/2020   Procedure: BRAVO [place] STUDY;  Surgeon: Milus Banister, MD;  Location: [facility];  Service: Endoscopy;  Laterality: N/A;   CHOLECYSTECTOMY     laprascopic   CYST EXCISION Right 12/29/2016   Procedure: EXCISION OF RIGHT AXILLARY CYST;  Surgeon: [name], MD;  Location: [facility];  Service: General;  Laterality: Right;   ESOPHAGOGASTRODUODENOSCOPY N/A 08/04/2020   Procedure: ESOPHAGOGASTRODUODENOSCOPY (EGD) with balloon dilation;  Surgeon: [name], MD;  Location: [facility];  Service: General;  Laterality: N/A;   ESOPHAGOGASTRODUODENOSCOPY (EGD) WITH PROPOFOL N/A 01/22/2020   Procedure: ESOPHAGOGASTRODUODENOSCOPY (EGD) WITH PROPOFOL;  Surgeon: Milus Banister, MD;  Location: [facility];  Service: Endoscopy;  Laterality: N/A;   FOOT SURGERY  02/2015   scar tissue and keloid scar removal   FOREIGN BODY  REMOVAL Right 09/17/2013   Procedure: REMOVAL FOREIGN BODY NEEDLE PLANTAR ASPECT OF THE FOOT;  Surgeon: Newt Minion, MD;  Location: [facility];  Service: Orthopedics;  Laterality: Right;  Remove Foreign Body Right Foot   LAPAROSCOPIC NISSEN FUNDOPLICATION  65/99/3570   WISDOM TOOTH EXTRACTION      Family History  Problem Relation Age of Onset   Diabetes Mother    Hypertension Mother    Thyroid disease Mother    Obesity Mother    Stroke Maternal Grandmother    Diabetes Maternal Grandfather    Stroke Maternal Grandfather    Hypertension Maternal Grandfather    Lung cancer Maternal Grandfather    Stomach cancer Neg Hx    Colon cancer Neg Hx    Rectal cancer Neg Hx    Pancreatic cancer Neg Hx     Social History:  reports that she has never smoked. She has never used smokeless tobacco. She reports current alcohol use. She reports that she does not use drugs.  Allergies:  Allergies  Allergen Reactions   Penicillins Hives and Nausea And Vomiting    Has patient had a PCN reaction causing immediate rash, facial/tongue/throat swelling, SOB or lightheadedness with hypotension: no Has patient had a PCN reaction causing severe rash involving mucus membranes or skin necrosis: unknown Has patient had a PCN reaction that required hospitalization unknown Has patient had a PCN reaction occurring within the last 10 years: unknown If all of the above answers are "NO", then may proceed with Cephalosporin use.     Medications Prior to Admission  Medication Sig Dispense Refill Last Dose   ALPRAZolam (XANAX) 0.5 MG tablet Take 1 tablet (0.5 mg total) by mouth 2 (two) times daily as needed for anxiety. 30 tablet 1 Past Month   Ascorbic Acid (VITAMIN C) 500 MG CAPS Take 500 mg by mouth daily.    Past Week   buPROPion (WELLBUTRIN) 100 MG tablet Take 1 tablet (100 mg total) by mouth 2 (two) times daily. 60 tablet 3 05/04/2022   Cholecalciferol (VITAMIN D3 PO) Take 3,000 Units by mouth daily at 12 noon.     Past Week   SUMAtriptan (IMITREX) 50 MG tablet Take 1 tablet (50 mg total) by mouth every 2 (two) hours as needed for migraine. May repeat in 2 hours if headache persists or recurs. 10 tablet 0 Past Month   traZODone (DESYREL) 100 MG tablet TAKE 1 TABLET BY MOUTH EVERYDAY AT BEDTIME 60 tablet 0 05/04/2022   Vitamin D, Ergocalciferol, (DRISDOL) 1.25 MG (50000 UNIT) CAPS capsule Take 1 capsule (50,000 Units total) by mouth every 7 (seven) days. 12 capsule 0 Past Week   ondansetron (ZOFRAN) 4 MG tablet Take 1 tablet (4 mg total) by mouth every 8 (eight) hours as needed for nausea or vomiting. 30 tablet 1 More than a month   promethazine (PHENERGAN) 25 MG tablet Take 1 tablet (25 mg total) by mouth every 8 (eight) hours as needed for nausea or vomiting. (Patient not taking: Reported on 04/27/2022) 30 tablet 0 Not Taking    Review of Systems  All other systems reviewed and are negative.   Blood pressure (!) 157/99, pulse 98, temperature 98.1 F (36.7 C), temperature source Oral, resp. rate 16, height '5\' 4"'$  (1.626 m), weight 125.6 kg, last menstrual period 12/28/2021, SpO2 100 %. Physical Exam Constitutional:      Appearance: Normal appearance. She is obese.  Eyes:     Extraocular Movements: Extraocular movements intact.  Cardiovascular:     Rate and Rhythm: Regular rhythm.     Heart sounds: Normal heart sounds.  Pulmonary:     Breath sounds: Normal breath sounds.  Abdominal:     Palpations: Abdomen is soft.  Musculoskeletal:        General: Normal range of motion.     Cervical back: Neck supple.  Skin:    General: Skin is warm and dry.  Neurological:     General: No focal deficit present.     Mental Status: She is alert and oriented to person, place, and time.  Psychiatric:        Mood and Affect: Mood normal.        Behavior: Behavior normal.     Results for orders placed or performed during the hospital encounter of 05/05/22 (from the past 24 hour(s))  Pregnancy, urine POC      Status: None   Collection Time: 05/05/22  6:58 AM  Result Value Ref Range   Preg Test, Ur NEGATIVE NEGATIVE    No results found.  Assessment/Plan: DUB Uterine fibroids P) dx hysteroscopy, D&C, resection of SM fibroids using myosure. Procedure explained. Risk of surgery reviewed including infection, bleeding, injury to surrounding organ structures, uterine perforation( 10/998) and its risk, thermal injury, fluid overload and its risk/mgmt,  possible need for blood transfusion and its risk, inability to complete resection of fibroids due to fluid retention and or large size. All ? answered  Sanaiyah Kirchhoff A Brennley Curtice 05/05/2022, 7:38 AM

## 2022-05-05 NOTE — Anesthesia Preprocedure Evaluation (Addendum)
Anesthesia Evaluation  Patient identified by MRN, date of birth, ID band Patient awake    Reviewed: Allergy & Precautions, NPO status , Patient's Chart, lab work & pertinent test results  History of Anesthesia Complications (+) PONV and history of anesthetic complications  Airway Mallampati: II  TM Distance: >3 FB Neck ROM: Full    Dental  (+) Chipped, Dental Advisory Given, Teeth Intact,    Pulmonary neg pulmonary ROS,    Pulmonary exam normal breath sounds clear to auscultation       Cardiovascular negative cardio ROS Normal cardiovascular exam Rhythm:Regular Rate:Normal     Neuro/Psych PSYCHIATRIC DISORDERS Anxiety Depression negative neurological ROS     GI/Hepatic GERD  Controlled and Medicated,(+)       alcohol use,   Endo/Other  Morbid obesityBMI 48  Renal/GU negative Renal ROS  negative genitourinary   Musculoskeletal  (+) Arthritis , Osteoarthritis,    Abdominal (+) + obese,   Peds  Hematology negative hematology ROS (+)   Anesthesia Other Findings   Reproductive/Obstetrics negative OB ROS                            Anesthesia Physical Anesthesia Plan  ASA: 3  Anesthesia Plan: General   Post-op Pain Management: Tylenol PO (pre-op)* and Toradol IV (intra-op)*   Induction: Intravenous  PONV Risk Score and Plan: 4 or greater and Ondansetron, Dexamethasone, Scopolamine patch - Pre-op, Treatment may vary due to age or medical condition, Midazolam, Aprepitant, Propofol infusion, TIVA and Diphenhydramine  Airway Management Planned: LMA  Additional Equipment: None  Intra-op Plan:   Post-operative Plan: Extubation in OR  Informed Consent: I have reviewed the patients History and Physical, chart, labs and discussed the procedure including the risks, benefits and alternatives for the proposed anesthesia with the patient or authorized representative who has indicated his/her  understanding and acceptance.     Dental advisory given  Plan Discussed with: CRNA  Anesthesia Plan Comments:       Anesthesia Quick Evaluation

## 2022-05-08 ENCOUNTER — Encounter (HOSPITAL_BASED_OUTPATIENT_CLINIC_OR_DEPARTMENT_OTHER): Payer: Self-pay | Admitting: Obstetrics and Gynecology

## 2022-05-08 LAB — SURGICAL PATHOLOGY

## 2022-05-23 LAB — RESULTS CONSOLE HPV: CHL HPV: NEGATIVE

## 2022-05-23 LAB — HM PAP SMEAR

## 2022-05-24 ENCOUNTER — Encounter (INDEPENDENT_AMBULATORY_CARE_PROVIDER_SITE_OTHER): Payer: Self-pay

## 2022-05-30 ENCOUNTER — Other Ambulatory Visit: Payer: Self-pay | Admitting: Family Medicine

## 2022-05-30 DIAGNOSIS — G4700 Insomnia, unspecified: Secondary | ICD-10-CM

## 2022-06-06 ENCOUNTER — Encounter: Payer: Self-pay | Admitting: Family Medicine

## 2022-06-06 ENCOUNTER — Other Ambulatory Visit (INDEPENDENT_AMBULATORY_CARE_PROVIDER_SITE_OTHER): Payer: BC Managed Care – PPO

## 2022-06-06 ENCOUNTER — Telehealth (INDEPENDENT_AMBULATORY_CARE_PROVIDER_SITE_OTHER): Payer: BC Managed Care – PPO | Admitting: Family Medicine

## 2022-06-06 DIAGNOSIS — F419 Anxiety disorder, unspecified: Secondary | ICD-10-CM

## 2022-06-06 DIAGNOSIS — D509 Iron deficiency anemia, unspecified: Secondary | ICD-10-CM

## 2022-06-06 LAB — CBC WITH DIFFERENTIAL/PLATELET
Basophils Absolute: 0 10*3/uL (ref 0.0–0.1)
Basophils Relative: 0.7 % (ref 0.0–3.0)
Eosinophils Absolute: 0.2 10*3/uL (ref 0.0–0.7)
Eosinophils Relative: 2.2 % (ref 0.0–5.0)
HCT: 30.2 % — ABNORMAL LOW (ref 36.0–46.0)
Hemoglobin: 9.7 g/dL — ABNORMAL LOW (ref 12.0–15.0)
Lymphocytes Relative: 15.1 % (ref 12.0–46.0)
Lymphs Abs: 1 10*3/uL (ref 0.7–4.0)
MCHC: 32.1 g/dL (ref 30.0–36.0)
MCV: 78 fl (ref 78.0–100.0)
Monocytes Absolute: 0.5 10*3/uL (ref 0.1–1.0)
Monocytes Relative: 7.6 % (ref 3.0–12.0)
Neutro Abs: 5.1 10*3/uL (ref 1.4–7.7)
Neutrophils Relative %: 74.4 % (ref 43.0–77.0)
Platelets: 436 10*3/uL — ABNORMAL HIGH (ref 150.0–400.0)
RBC: 3.88 Mil/uL (ref 3.87–5.11)
RDW: 18 % — ABNORMAL HIGH (ref 11.5–15.5)
WBC: 6.8 10*3/uL (ref 4.0–10.5)

## 2022-06-06 NOTE — Progress Notes (Unsigned)
Virtual Visit via Video   I connected with patient on 06/06/22 at 11:20 AM EDT by a video enabled telemedicine application and verified that I am speaking with the correct person using two identifiers.  Location patient: Home Location provider: [name], Office Persons participating in the virtual visit: Patient, Provider, [place] Marcille Blanco C)  I discussed the limitations of evaluation and management by telemedicine and the availability of in person appointments. The patient expressed understanding and agreed to proceed.  Subjective:   HPI:   Anxiety- pt saw GYN in April and was started on continuous OCPs to help w/ menorrhagia.  Unfortunately she had 2 weeks of breakthrough bleeding.  US showed large fibroid.  Had surgery on 7/21.  Had been on cycle x7 weeks at that point.  Was given oxycodone for pain.  Pharmacist told her not to take Wellbutrin and pain meds at the same time due to risk of respiratory depression.  Restarted Wellbutrin last week.  Restarted Trazodone.  Anemia- after 7 weeks of menstrual cycle Hgb was 9.7  Not currently on iron supplement.  Pt has had OCPs switched multiple times.  Still bleeding.  Doesn't have GYN appt scheduled.  ROS:   See pertinent positives and negatives per HPI.  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 tube) procedure 06/01/2020   GERD (gastroesophageal reflux disease) 05/01/2019   Knee osteoarthritis 09/12/2017   Morbid obesity ([place]) 08/13/2017   Anxiety 04/11/2017   Insomnia 04/11/2017   Fatty liver 09/27/2016   Physical exam 02/21/2013    Social History   Tobacco Use   Smoking status: Never   Smokeless tobacco: Never  Substance Use Topics   Alcohol use: Yes    Comment: occasionally    Current Outpatient Medications:    ALPRAZolam  (XANAX) 0.5 MG tablet, Take 1 tablet (0.5 mg total) by mouth 2 (two) times daily as needed for anxiety., Disp: 30 tablet, Rfl: 1   Ascorbic Acid (VITAMIN C) 500 MG CAPS, Take 500 mg by mouth daily. , Disp: , Rfl:    buPROPion (WELLBUTRIN) 100 MG tablet, Take 1 tablet (100 mg total) by mouth 2 (two) times daily., Disp: 60 tablet, Rfl: 3   Cholecalciferol (VITAMIN D3 PO), Take 3,000 Units by mouth daily at 12 noon. , Disp: , Rfl:    ibuprofen (ADVIL) 800 MG tablet, Take 1 tablet (800 mg total) by mouth every 8 (eight) hours as needed for moderate pain, mild pain or cramping., Disp: 30 tablet, Rfl: 7   ondansetron (ZOFRAN) 4 MG tablet, Take 1 tablet (4 mg total) by mouth every 8 (eight) hours as needed for nausea or vomiting., Disp: 30 tablet, Rfl: 1   Vitamin D, Ergocalciferol, (DRISDOL) 1.25 MG (50000 UNIT) CAPS capsule, Take 1 capsule (50,000 Units total) by mouth every 7 (seven) days., Disp: 12 capsule, Rfl: 0   promethazine (PHENERGAN) 25 MG tablet, Take 1 tablet (25 mg total) by mouth every 8 (eight) hours as needed for nausea or vomiting. (Patient not taking: Reported on 06/06/2022), Disp: 30 tablet, Rfl: 0   SUMAtriptan (IMITREX) 50 MG tablet, Take 1 tablet (50 mg total) by mouth every 2 (two) hours as needed for migraine. May repeat in 2 hours if headache persists or recurs. (Patient not taking: Reported on 06/06/2022), Disp: 10 tablet, Rfl: 0   traZODone (DESYREL) 100  MG tablet, TAKE 1 TABLET BY MOUTH EVERYDAY AT BEDTIME (Patient not taking: Reported on 06/06/2022), Disp: 30 tablet, Rfl: 1  Allergies  Allergen Reactions   Penicillins Hives, Nausea And Vomiting and Rash    Has patient had a PCN reaction causing immediate rash, facial/tongue/throat swelling, SOB or lightheadedness with hypotension: no Has patient had a PCN reaction causing severe rash involving mucus membranes or skin necrosis: unknown Has patient had a PCN reaction that required hospitalization unknown Has patient had a PCN  reaction occurring within the last 10 years: unknown If all of the above answers are "NO", then may proceed with Cephalosporin use.  Has patient had a PCN reaction causing immediate rash, facial/tongue/throat swelling, SOB or lightheadedness with hypotension: no Has patient had a PCN reaction causing severe rash involving mucus membranes or skin necrosis: unknown Has patient had a PCN reaction that required hospitalization unknown Has patient had a PCN reaction occurring within the last 10 years: unknown If all of the above answers are "NO", then may proceed with Cephalosporin use. Has patient had a PCN reaction causing immediat    Objective:   There were no vitals taken for this visit. AAOx3, NAD NCAT, EOMI No obvious CN deficits Coloring WNL Pt is able to speak clearly, coherently without shortness of breath or increased work of breathing.  Thought process is linear.  Mood is appropriate.   Assessment and Plan:   Iron deficiency anemia- pt has hx of similar but given that she has been having menstrual bleeding since May, concern for worsening Hgb.  Pt was intolerant to iron supplement in the past.  Encouraged her to start Flinstones vitamins as they are chewable and have iron.  She is to let me know if she continues to have menstrual bleeding over the next month as she will then need a 2nd GYN opinion.  Pt expressed understanding and is in agreement w/ plan.   Anxiety- pt restarted Wellbutrin last week which was definitely the right decision.  Reviewed that it may take some time to build back up in her system.  Will follow.   [name], MD 06/06/2022

## 2022-06-07 LAB — IRON,TIBC AND FERRITIN PANEL
%SAT: 6 % (calc) — ABNORMAL LOW (ref 16–45)
Ferritin: 5 ng/mL — ABNORMAL LOW (ref 16–154)
Iron: 19 ug/dL — ABNORMAL LOW (ref 40–190)
TIBC: 324 mcg/dL (calc) (ref 250–450)

## 2022-06-07 NOTE — Progress Notes (Signed)
Called and informed pt of Dr Birdie Riddle notes about the [place] vitamins

## 2022-06-19 ENCOUNTER — Other Ambulatory Visit: Payer: Self-pay | Admitting: Family Medicine

## 2022-07-29 ENCOUNTER — Other Ambulatory Visit: Payer: Self-pay | Admitting: Family Medicine

## 2022-07-29 DIAGNOSIS — G4700 Insomnia, unspecified: Secondary | ICD-10-CM

## 2022-08-07 ENCOUNTER — Encounter: Payer: Self-pay | Admitting: Family Medicine

## 2022-08-07 ENCOUNTER — Other Ambulatory Visit: Payer: Self-pay | Admitting: Family Medicine

## 2022-08-07 ENCOUNTER — Telehealth (INDEPENDENT_AMBULATORY_CARE_PROVIDER_SITE_OTHER): Payer: BC Managed Care – PPO | Admitting: Family Medicine

## 2022-08-07 DIAGNOSIS — F419 Anxiety disorder, unspecified: Secondary | ICD-10-CM | POA: Diagnosis not present

## 2022-08-07 DIAGNOSIS — F32A Depression, unspecified: Secondary | ICD-10-CM | POA: Diagnosis not present

## 2022-08-07 DIAGNOSIS — G4700 Insomnia, unspecified: Secondary | ICD-10-CM

## 2022-08-07 MED ORDER — BUPROPION HCL ER (XL) 150 MG PO TB24: 150.0000 mg | ORAL_TABLET | Freq: Every day | ORAL | 3 refills | Status: DC

## 2022-08-07 NOTE — Progress Notes (Signed)
Virtual Visit via Video   I connected with patient on 08/07/22 at  1:00 PM EDT by a video enabled telemedicine application and verified that I am speaking with the correct person using two identifiers.  Location patient: Home Location provider: [name], Office Persons participating in the virtual visit: Patient, Provider, [place] Marcille Blanco C)  I discussed the limitations of evaluation and management by telemedicine and the availability of in person appointments. The patient expressed understanding and agreed to proceed.  Subjective:   HPI:   Anxiety/Depression- Currently on Wellbutrin '100mg'$  BID and Alprazolam prn.  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  Pt feels overwhelmed.  At this point, pt doesn't like to come to work.  Currently in therapy.  Pt took Xanax 3 days last week.  Therapist thought this was too much.  ROS:   See pertinent positives and negatives per HPI.  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 tube) procedure 06/01/2020   GERD (gastroesophageal reflux disease) 05/01/2019   Knee osteoarthritis 09/12/2017   Morbid obesity ([place]) 08/13/2017   Anxiety 04/11/2017   Insomnia 04/11/2017   Fatty liver 09/27/2016   Physical exam 02/21/2013    Social History   Tobacco Use   Smoking status: Never   Smokeless tobacco: Never  Substance Use Topics   Alcohol use: Yes    Comment: occasionally    Current Outpatient Medications:    ALPRAZolam (XANAX) 0.5 MG tablet, Take 1 tablet (0.5 mg total) by mouth 2 (two) times daily as needed for anxiety., Disp: 30 tablet, Rfl: 1   buPROPion (WELLBUTRIN) 100 MG tablet, TAKE 1 TABLET BY MOUTH TWICE A DAY, Disp: 60 tablet, Rfl: 3   Cholecalciferol (VITAMIN  D3 PO), Take 3,000 Units by mouth daily at 12 noon. , Disp: , Rfl:    ibuprofen (ADVIL) 800 MG tablet, Take 1 tablet (800 mg total) by mouth every 8 (eight) hours as needed for moderate pain, mild pain or cramping., Disp: 30 tablet, Rfl: 7   ondansetron (ZOFRAN) 4 MG tablet, Take 1 tablet (4 mg total) by mouth every 8 (eight) hours as needed for nausea or vomiting., Disp: 30 tablet, Rfl: 1   Pediatric Multiple Vitamins (FLINSTONES GUMMIES OMEGA-3 DHA PO), Take by mouth., Disp: , Rfl:    Ascorbic Acid (VITAMIN C) 500 MG CAPS, Take 500 mg by mouth daily.  (Patient not taking: Reported on 08/07/2022), Disp: , Rfl:    Vitamin D, Ergocalciferol, (DRISDOL) 1.25 MG (50000 UNIT) CAPS capsule, Take 1 capsule (50,000 Units total) by mouth every 7 (seven) days. (Patient not taking: Reported on 08/07/2022), Disp: 12 capsule, Rfl: 0  Allergies  Allergen Reactions   Penicillins Hives, Nausea And Vomiting and Rash    Has patient had a PCN reaction causing immediate rash, facial/tongue/throat swelling, SOB or lightheadedness with hypotension: no Has patient had a PCN reaction causing severe rash involving mucus membranes or skin necrosis: unknown Has patient had a PCN reaction that required hospitalization unknown Has patient had a PCN reaction occurring within the last 10 years: unknown If all of the above answers are "NO", then may proceed with Cephalosporin use.  Has patient had a PCN reaction causing immediate rash, facial/tongue/throat swelling, SOB or lightheadedness with hypotension: no Has patient had  a PCN reaction causing severe rash involving mucus membranes or skin necrosis: unknown Has patient had a PCN reaction that required hospitalization unknown Has patient had a PCN reaction occurring within the last 10 years: unknown If all of the above answers are "NO", then may proceed with Cephalosporin use. Has patient had a PCN reaction causing immediat    Objective:   There were no vitals taken for  this visit. AAOx3, NAD NCAT, EOMI No obvious CN deficits Coloring WNL Pt is able to speak clearly, coherently without shortness of breath or increased work of breathing.  Thought process is linear.  Mood is appropriate.   Assessment and Plan:   Anxiety/Depression- deteriorated.  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  She is at the point she doesn't want to go to work.  Therapist was concerned b/c she took 3 Alprazolam last week.  Pt has had the same 30 day prescription since January.  I am not at all concerned about benzo overuse.  Will increase Wellbutrin to '150mg'$  daily and change to extended release.  If no improvement w/ increased Wellbutrin will consider restarting SSRI.  Pt expressed understanding and is in agreement w/ plan.    [name], MD 08/07/2022

## 2022-08-07 NOTE — Telephone Encounter (Signed)
It says that this Rx was D/C . Pt is wanting a refill is it ok to refill?

## 2022-08-30 ENCOUNTER — Encounter: Payer: Self-pay | Admitting: Family Medicine

## 2022-08-30 ENCOUNTER — Telehealth (INDEPENDENT_AMBULATORY_CARE_PROVIDER_SITE_OTHER): Payer: BC Managed Care – PPO | Admitting: Family Medicine

## 2022-08-30 DIAGNOSIS — F419 Anxiety disorder, unspecified: Secondary | ICD-10-CM

## 2022-08-30 DIAGNOSIS — F32A Depression, unspecified: Secondary | ICD-10-CM

## 2022-08-30 MED ORDER — DESVENLAFAXINE SUCCINATE ER 50 MG PO TB24: 50.0000 mg | ORAL_TABLET | Freq: Every day | ORAL | 3 refills | Status: DC

## 2022-08-30 NOTE — Progress Notes (Signed)
Virtual Visit via Video   I connected with patient on 08/30/22 at  9:20 AM EST by a video enabled telemedicine application and verified that I am speaking with the correct person using two identifiers.  Location patient: Home Location provider: [name], Office Persons participating in the virtual visit: Patient, Provider, [place] Marcille Blanco C)  I discussed the limitations of evaluation and management by telemedicine and the availability of in person appointments. The patient expressed understanding and agreed to proceed.  Subjective:   HPI:   Anxiety/Depression- at last visit Wellbutrin was increased to '150mg'$  daily.  'i'm not feeling any better'.  Yesterday had to take Alprazolam.  Last week had an issue combining a decongestant and Wellbutrin- 'i was in the clouds'.  Pt is tolerating the extended release Wellbutrin but continues to be irritable.  Having issues w/ sleep- able to fall asleep but will then wake around 2am and be up for multiple hours.  Was previously on Fluoxetine, Sertraline, Venlafaxine.  'i just want to feel normal again.  I don't feel like me'.      ROS:   See pertinent positives and negatives per HPI.  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 tube) procedure 06/01/2020   GERD (gastroesophageal reflux disease) 05/01/2019   Knee osteoarthritis 09/12/2017   Morbid obesity ([place]) 08/13/2017   Anxiety and depression 04/11/2017   Insomnia 04/11/2017   Fatty liver 09/27/2016   Physical exam 02/21/2013    Social History   Tobacco Use   Smoking status: Never   Smokeless tobacco: Never  Substance Use Topics   Alcohol use: Yes    Comment: occasionally    Current Outpatient Medications:    ALPRAZolam (XANAX) 0.5 MG tablet, Take 1 tablet (0.5 mg total) by mouth 2 (two)  times daily as needed for anxiety., Disp: 30 tablet, Rfl: 1   buPROPion (WELLBUTRIN XL) 150 MG 24 hr tablet, Take 1 tablet (150 mg total) by mouth daily., Disp: 30 tablet, Rfl: 3   Cholecalciferol (VITAMIN D3 PO), Take 3,000 Units by mouth daily at 12 noon. , Disp: , Rfl:    ibuprofen (ADVIL) 800 MG tablet, Take 1 tablet (800 mg total) by mouth every 8 (eight) hours as needed for moderate pain, mild pain or cramping., Disp: 30 tablet, Rfl: 7   ondansetron (ZOFRAN) 4 MG tablet, Take 1 tablet (4 mg total) by mouth every 8 (eight) hours as needed for nausea or vomiting., Disp: 30 tablet, Rfl: 1   Pediatric Multiple Vitamins (FLINSTONES GUMMIES OMEGA-3 DHA PO), Take by mouth., Disp: , Rfl:    traZODone (DESYREL) 100 MG tablet, TAKE 1 TABLET BY MOUTH EVERYDAY AT BEDTIME, Disp: 30 tablet, Rfl: 1  Allergies  Allergen Reactions   Penicillins Hives, Nausea And Vomiting and Rash    Has patient had a PCN reaction causing immediate rash, facial/tongue/throat swelling, SOB or lightheadedness with hypotension: no Has patient had a PCN reaction causing severe rash involving mucus membranes or skin necrosis: unknown Has patient had a PCN reaction that required hospitalization unknown Has patient had a PCN reaction occurring within the last 10 years: unknown If all of the above answers are "NO", then may proceed with Cephalosporin use.  Has patient had a PCN reaction causing immediate rash, facial/tongue/throat swelling, SOB or lightheadedness with hypotension: no Has patient had a PCN reaction  causing severe rash involving mucus membranes or skin necrosis: unknown Has patient had a PCN reaction that required hospitalization unknown Has patient had a PCN reaction occurring within the last 10 years: unknown If all of the above answers are "NO", then may proceed with Cephalosporin use. Has patient had a PCN reaction causing immediat    Objective:   There were no vitals taken for this visit. AAOx3,  NAD NCAT, EOMI No obvious CN deficits Coloring WNL Pt is able to speak clearly, coherently without shortness of breath or increased work of breathing.  Thought process is linear.  Mood is appropriate.   Assessment and Plan:   Anxiety/depression- no change w/ increased dose of Wellbutrin.  Has been on SSRIs/SNRIs in the past but doesn't feel that any of them were particularly effective.  Will start Desvenlafaxine as she has not been on this in the past.  Discussed that starting dose is the treatment dose so we won't need to adjust.  If medication is not working, we'll switch to something else.  Pt expressed understanding and is in agreement w/ plan.    [name], MD 08/30/2022

## 2022-09-19 ENCOUNTER — Telehealth: Payer: Self-pay | Admitting: Family Medicine

## 2022-09-19 NOTE — Telephone Encounter (Signed)
Looks like filled apx 09/07/22 30 tab, okay to do short fill or should we OK early refill? Please advise

## 2022-09-19 NOTE — Telephone Encounter (Signed)
Caller name: SHERIDYN CANINO  On DPR?: Yes  Call back number: [phone] (mobile)  Provider they see: [name], MD  Reason for call: Pt called stating that she has miss placed her Wellbutrin XL 150. Pt  called the pharmacy, they told  her its too soon for refill. Pt want to know if we could send in a partial refill in until her next refill. Pt would like it to be send in to  [id number] [place]

## 2022-09-20 ENCOUNTER — Other Ambulatory Visit: Payer: Self-pay

## 2022-09-20 MED ORDER — BUPROPION HCL ER (XL) 150 MG PO TB24: 150.0000 mg | ORAL_TABLET | Freq: Every day | ORAL | 0 refills | Status: DC

## 2022-09-20 NOTE — Telephone Encounter (Signed)
Ok to fill #10, no refills

## 2022-09-20 NOTE — Telephone Encounter (Signed)
Sent!

## 2022-09-20 NOTE — Telephone Encounter (Signed)
Ok to advise pharmacy to proceed w/ early refill due to misplaced medication.

## 2022-09-20 NOTE — Telephone Encounter (Signed)
Called the pharmacy and they are going to call they are going to call the insurance will call pt to inform her we have authorized this and now on insurance

## 2022-09-20 NOTE — Telephone Encounter (Signed)
Called pt and pharmacy apparently already contacted her and told her they would not fill it early it has to be filled on the 16th can we send in a 10 day supply for meantime?

## 2022-09-29 ENCOUNTER — Other Ambulatory Visit: Payer: Self-pay | Admitting: Family Medicine

## 2022-09-29 DIAGNOSIS — G4700 Insomnia, unspecified: Secondary | ICD-10-CM

## 2022-10-06 ENCOUNTER — Other Ambulatory Visit: Payer: Self-pay | Admitting: Family Medicine

## 2022-10-06 ENCOUNTER — Telehealth: Payer: BC Managed Care – PPO | Admitting: Physician Assistant

## 2022-10-06 DIAGNOSIS — J019 Acute sinusitis, unspecified: Secondary | ICD-10-CM | POA: Diagnosis not present

## 2022-10-06 DIAGNOSIS — B9689 Other specified bacterial agents as the cause of diseases classified elsewhere: Secondary | ICD-10-CM

## 2022-10-06 MED ORDER — DOXYCYCLINE HYCLATE 100 MG PO TABS: 100.0000 mg | ORAL_TABLET | Freq: Two times a day (BID) | ORAL | 0 refills | Status: DC

## 2022-10-06 NOTE — Progress Notes (Signed)
Virtual Visit Consent   Krystal Harris, you are scheduled for a virtual visit with a [facility] provider today. Just as with appointments in the office, your consent must be obtained to participate. Your consent will be active for this visit and any virtual visit you may have with one of our providers in the next 365 days. If you have a MyChart account, a copy of this consent can be sent to you electronically.  As this is a virtual visit, video technology does not allow for your provider to perform a traditional examination. This may limit your provider's ability to fully assess your condition. If your provider identifies any concerns that need to be evaluated in person or the need to arrange testing (such as labs, EKG, etc.), we will make arrangements to do so. Although advances in technology are sophisticated, we cannot ensure that it will always work on either your end or our end. If the connection with a video visit is poor, the visit may have to be switched to a telephone visit. With either a video or telephone visit, we are not always able to ensure that we have a secure connection.  By engaging in this virtual visit, you consent to the provision of healthcare and authorize for your insurance to be billed (if applicable) for the services provided during this visit. Depending on your insurance coverage, you may receive a charge related to this service.  I need to obtain your verbal consent now. Are you willing to proceed with your visit today? Krystal Harris has provided verbal consent on 10/06/2022 for a virtual visit (video or telephone). Mar Daring, PA-C  Date: 10/06/2022 2:13 PM  Virtual Visit via Video Note   I, Mar Daring, connected with  Krystal Harris  ([id number], [birth year]) on 10/06/22 at  2:00 PM EST by a video-enabled telemedicine application and verified that I am speaking with the correct person using two identifiers.  Location: Patient: Virtual Visit  Location Patient: Mobile Provider: Virtual Visit Location Provider: Home Office   I discussed the limitations of evaluation and management by telemedicine and the availability of in person appointments. The patient expressed understanding and agreed to proceed.    History of Present Illness: Krystal Harris is a 36 y.o. who identifies as a female who was assigned female at birth, and is being seen today for possible sinus infection.  HPI: Sinusitis This is a new problem. The problem has been gradually worsening since onset. There has been no fever. The pain is moderate. Associated symptoms include congestion, coughing (very mild, throat clearing), headaches, sinus pressure and a sore throat (scratchy). Pertinent negatives include no chills, ear pain or hoarse voice. (Runny nose, post nasal drainage) Treatments tried: aleve-D. The treatment provided no relief.    Negative at home covid 19 testing  Problems:  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 tube) procedure 06/01/2020   GERD (gastroesophageal reflux disease) 05/01/2019   Knee osteoarthritis 09/12/2017   Morbid obesity ([place]) 08/13/2017   Anxiety and depression 04/11/2017   Insomnia 04/11/2017   Fatty liver 09/27/2016   Physical exam 02/21/2013    Allergies:  Allergies  Allergen Reactions   Penicillins Hives, Nausea And Vomiting and Rash    Has patient had a PCN reaction causing immediate rash, facial/tongue/throat swelling, SOB or lightheadedness  with hypotension: no Has patient had a PCN reaction causing severe rash involving mucus membranes or skin necrosis: unknown Has patient had a PCN reaction that required hospitalization unknown Has patient had a PCN reaction occurring within the last 10 years: unknown If all of the above answers are  "NO", then may proceed with Cephalosporin use.  Has patient had a PCN reaction causing immediate rash, facial/tongue/throat swelling, SOB or lightheadedness with hypotension: no Has patient had a PCN reaction causing severe rash involving mucus membranes or skin necrosis: unknown Has patient had a PCN reaction that required hospitalization unknown Has patient had a PCN reaction occurring within the last 10 years: unknown If all of the above answers are "NO", then may proceed with Cephalosporin use. Has patient had a PCN reaction causing immediat   Medications:  Current Outpatient Medications:    doxycycline (VIBRA-TABS) 100 MG tablet, Take 1 tablet (100 mg total) by mouth 2 (two) times daily., Disp: 20 tablet, Rfl: 0   ALPRAZolam (XANAX) 0.5 MG tablet, Take 1 tablet (0.5 mg total) by mouth 2 (two) times daily as needed for anxiety., Disp: 30 tablet, Rfl: 1   buPROPion (WELLBUTRIN XL) 150 MG 24 hr tablet, Take 1 tablet (150 mg total) by mouth daily., Disp: 10 tablet, Rfl: 0   Cholecalciferol (VITAMIN D3 PO), Take 3,000 Units by mouth daily at 12 noon. , Disp: , Rfl:    desvenlafaxine (PRISTIQ) 50 MG 24 hr tablet, Take 1 tablet (50 mg total) by mouth daily., Disp: 30 tablet, Rfl: 3   ibuprofen (ADVIL) 800 MG tablet, Take 1 tablet (800 mg total) by mouth every 8 (eight) hours as needed for moderate pain, mild pain or cramping., Disp: 30 tablet, Rfl: 7   ondansetron (ZOFRAN) 4 MG tablet, Take 1 tablet (4 mg total) by mouth every 8 (eight) hours as needed for nausea or vomiting., Disp: 30 tablet, Rfl: 1   Pediatric Multiple Vitamins (FLINSTONES GUMMIES OMEGA-3 DHA PO), Take by mouth., Disp: , Rfl:    traZODone (DESYREL) 100 MG tablet, TAKE 1 TABLET BY MOUTH EVERYDAY AT BEDTIME, Disp: 30 tablet, Rfl: 1  Observations/Objective: Patient is well-developed, well-nourished in no acute distress.  Resting comfortably Head is normocephalic, atraumatic.  No labored breathing.  Speech is clear and coherent  with logical content.  Patient is alert and oriented at baseline.    Assessment and Plan: 1. Acute bacterial sinusitis - doxycycline (VIBRA-TABS) 100 MG tablet; Take 1 tablet (100 mg total) by mouth 2 (two) times daily.  Dispense: 20 tablet; Refill: 0  - Worsening symptoms that have not responded to OTC medications.  - Will give Doxycycline - Continue allergy medications.  - Steam and humidifier can help - Stay well hydrated and get plenty of rest.  - Seek in person evaluation if no symptom improvement or if symptoms worsen   Follow Up Instructions: I discussed the assessment and treatment plan with the patient. The patient was provided an opportunity to ask questions and all were answered. The patient agreed with the plan and demonstrated an understanding of the instructions.  A copy of instructions were sent to the patient via MyChart unless otherwise noted below.    The patient was advised to call back or seek an in-person evaluation if the symptoms worsen or if the condition fails to improve as anticipated.  Time:  I spent 8 minutes with the patient via telehealth technology discussing the above problems/concerns.    Mar Daring, PA-C

## 2022-10-06 NOTE — Patient Instructions (Signed)
Krystal Harris, thank you for joining Mar Daring, PA-C for today's virtual visit.  While this provider is not your primary care provider (PCP), if your PCP is located in our provider database this encounter information will be shared with them immediately following your visit.   [facility] account gives you access to today's visit and all your visits, tests, and labs performed at [facility] " click here if you don't have a [facility] account or go to [email or web address]  Consent: (Patient) Krystal Harris provided verbal consent for this virtual visit at the beginning of the encounter.  Current Medications:  Current Outpatient Medications:    doxycycline (VIBRA-TABS) 100 MG tablet, Take 1 tablet (100 mg total) by mouth 2 (two) times daily., Disp: 20 tablet, Rfl: 0   ALPRAZolam (XANAX) 0.5 MG tablet, Take 1 tablet (0.5 mg total) by mouth 2 (two) times daily as needed for anxiety., Disp: 30 tablet, Rfl: 1   buPROPion (WELLBUTRIN XL) 150 MG 24 hr tablet, Take 1 tablet (150 mg total) by mouth daily., Disp: 10 tablet, Rfl: 0   Cholecalciferol (VITAMIN D3 PO), Take 3,000 Units by mouth daily at 12 noon. , Disp: , Rfl:    desvenlafaxine (PRISTIQ) 50 MG 24 hr tablet, Take 1 tablet (50 mg total) by mouth daily., Disp: 30 tablet, Rfl: 3   ibuprofen (ADVIL) 800 MG tablet, Take 1 tablet (800 mg total) by mouth every 8 (eight) hours as needed for moderate pain, mild pain or cramping., Disp: 30 tablet, Rfl: 7   ondansetron (ZOFRAN) 4 MG tablet, Take 1 tablet (4 mg total) by mouth every 8 (eight) hours as needed for nausea or vomiting., Disp: 30 tablet, Rfl: 1   Pediatric Multiple Vitamins (FLINSTONES GUMMIES OMEGA-3 DHA PO), Take by mouth., Disp: , Rfl:    traZODone (DESYREL) 100 MG tablet, TAKE 1 TABLET BY MOUTH EVERYDAY AT BEDTIME, Disp: 30 tablet, Rfl: 1   Medications ordered in this encounter:  Meds ordered this encounter  Medications   doxycycline  (VIBRA-TABS) 100 MG tablet    Sig: Take 1 tablet (100 mg total) by mouth 2 (two) times daily.    Dispense:  20 tablet    Refill:  0    Order Specific Question:   Supervising Provider    Answer:   Chase Picket [id number]     *If you need refills on other medications prior to your next appointment, please contact your pharmacy*  Follow-Up: Call back or seek an in-person evaluation if the symptoms worsen or if the condition fails to improve as anticipated.  [place] [phone]  Other Instructions  Sinus Infection, Adult A sinus infection, also called sinusitis, is inflammation of your sinuses. Sinuses are hollow spaces in the bones around your face. Your sinuses are located: Around your eyes. In the middle of your forehead. Behind your nose. In your cheekbones. Mucus normally drains out of your sinuses. When your nasal tissues become inflamed or swollen, mucus can become trapped or blocked. This allows bacteria, viruses, and fungi to grow, which leads to infection. Most infections of the sinuses are caused by a virus. A sinus infection can develop quickly. It can last for up to 4 weeks (acute) or for more than 12 weeks (chronic). A sinus infection often develops after a cold. What are the causes? This condition is caused by anything that creates swelling in the sinuses or stops mucus from draining. This includes: Allergies. Asthma. Infection from  bacteria or viruses. Deformities or blockages in your nose or sinuses. Abnormal growths in the nose (nasal polyps). Pollutants, such as chemicals or irritants in the air. Infection from fungi. This is rare. What increases the risk? You are more likely to develop this condition if you: Have a weak body defense system (immune system). Do a lot of swimming or diving. Overuse nasal sprays. Smoke. What are the signs or symptoms? The main symptoms of this condition are pain and a feeling of pressure around the affected  sinuses. Other symptoms include: Stuffy nose or congestion that makes it difficult to breathe through your nose. Thick yellow or greenish drainage from your nose. Tenderness, swelling, and warmth over the affected sinuses. A cough that may get worse at night. Decreased sense of smell and taste. Extra mucus that collects in the throat or the back of the nose (postnasal drip) causing a sore throat or bad breath. Tiredness (fatigue). Fever. How is this diagnosed? This condition is diagnosed based on: Your symptoms. Your medical history. A physical exam. Tests to find out if your condition is acute or chronic. This may include: Checking your nose for nasal polyps. Viewing your sinuses using a device that has a light (endoscope). Testing for allergies or bacteria. Imaging tests, such as an MRI or CT scan. In rare cases, a bone biopsy may be done to rule out more serious types of fungal sinus disease. How is this treated? Treatment for a sinus infection depends on the cause and whether your condition is chronic or acute. If caused by a virus, your symptoms should go away on their own within 10 days. You may be given medicines to relieve symptoms. They include: Medicines that shrink swollen nasal passages (decongestants). A spray that eases inflammation of the nostrils (topical intranasal corticosteroids). Rinses that help get rid of thick mucus in your nose (nasal saline washes). Medicines that treat allergies (antihistamines). Over-the-counter pain relievers. If caused by bacteria, your health care provider may recommend waiting to see if your symptoms improve. Most bacterial infections will get better without antibiotic medicine. You may be given antibiotics if you have: A severe infection. A weak immune system. If caused by narrow nasal passages or nasal polyps, surgery may be needed. Follow these instructions at home: Medicines Take, use, or apply over-the-counter and prescription  medicines only as told by your health care provider. These may include nasal sprays. If you were prescribed an antibiotic medicine, take it as told by your health care provider. Do not stop taking the antibiotic even if you start to feel better. Hydrate and humidify  Drink enough fluid to keep your urine pale yellow. Staying hydrated will help to thin your mucus. Use a cool mist humidifier to keep the humidity level in your home above 50%. Inhale steam for 10-15 minutes, 3-4 times a day, or as told by your health care provider. You can do this in the bathroom while a hot shower is running. Limit your exposure to cool or dry air. Rest Rest as much as possible. Sleep with your head raised (elevated). Make sure you get enough sleep each night. General instructions  Apply a warm, moist washcloth to your face 3-4 times a day or as told by your health care provider. This will help with discomfort. Use nasal saline washes as often as told by your health care provider. Wash your hands often with soap and water to reduce your exposure to germs. If soap and water are not available, use hand sanitizer.  Do not smoke. Avoid being around people who are smoking (secondhand smoke). Keep all follow-up visits. This is important. Contact a health care provider if: You have a fever. Your symptoms get worse. Your symptoms do not improve within 10 days. Get help right away if: You have a severe headache. You have persistent vomiting. You have severe pain or swelling around your face or eyes. You have vision problems. You develop confusion. Your neck is stiff. You have trouble breathing. These symptoms may be an emergency. Get help right away. Call 911. Do not wait to see if the symptoms will go away. Do not drive yourself to the hospital. Summary A sinus infection is soreness and inflammation of your sinuses. Sinuses are hollow spaces in the bones around your face. This condition is caused by nasal  tissues that become inflamed or swollen. The swelling traps or blocks the flow of mucus. This allows bacteria, viruses, and fungi to grow, which leads to infection. If you were prescribed an antibiotic medicine, take it as told by your health care provider. Do not stop taking the antibiotic even if you start to feel better. Keep all follow-up visits. This is important. This information is not intended to replace advice given to you by your health care provider. Make sure you discuss any questions you have with your health care provider. Document Revised: 09/06/2021 Document Reviewed: 09/06/2021 Elsevier Patient Education  [place].    If you have been instructed to have an in-person evaluation today at a local Urgent Care facility, please use the link below. It will take you to a list of all of our available [facility], including address, phone number and hours of operation. Please do not delay care.  [facility]  If you or a family member do not have a primary care provider, use the link below to schedule a visit and establish care. When you choose a [facility] primary care physician or advanced practice provider, you gain a long-term partner in health. Find a Primary Care Provider  Learn more about [facility]'s in-office and virtual care options: [facility]

## 2022-10-09 NOTE — Telephone Encounter (Signed)
Xanax 0.5   LOV: 08/30/22 Last Refill:10/26/21 Upcoming appt: none

## 2022-10-10 NOTE — Telephone Encounter (Signed)
Controlled substance database reviewed, last filled #30 on 10/26/2021, previously 03/16/2021.  No concerning findings, anxiety/depression discussed in November.  Refill ordered.

## 2022-11-16 ENCOUNTER — Encounter: Payer: Self-pay | Admitting: Family Medicine

## 2022-11-16 ENCOUNTER — Ambulatory Visit: Payer: BC Managed Care – PPO | Admitting: Family Medicine

## 2022-11-16 VITALS — BP 132/84 | HR 96 | Temp 97.6°F | Resp 17 | Ht 64.0 in | Wt 279.2 lb

## 2022-11-16 DIAGNOSIS — F32A Depression, unspecified: Secondary | ICD-10-CM | POA: Diagnosis not present

## 2022-11-16 DIAGNOSIS — F419 Anxiety disorder, unspecified: Secondary | ICD-10-CM | POA: Diagnosis not present

## 2022-11-16 DIAGNOSIS — L659 Nonscarring hair loss, unspecified: Secondary | ICD-10-CM | POA: Diagnosis not present

## 2022-11-16 LAB — CBC WITH DIFFERENTIAL/PLATELET
Basophils Absolute: 0.1 10*3/uL (ref 0.0–0.1)
Basophils Relative: 0.9 % (ref 0.0–3.0)
Eosinophils Absolute: 0.3 10*3/uL (ref 0.0–0.7)
Eosinophils Relative: 4.6 % (ref 0.0–5.0)
HCT: 35.1 % — ABNORMAL LOW (ref 36.0–46.0)
Hemoglobin: 11.2 g/dL — ABNORMAL LOW (ref 12.0–15.0)
Lymphocytes Relative: 15.6 % (ref 12.0–46.0)
Lymphs Abs: 0.9 10*3/uL (ref 0.7–4.0)
MCHC: 32 g/dL (ref 30.0–36.0)
MCV: 84 fl (ref 78.0–100.0)
Monocytes Absolute: 0.4 10*3/uL (ref 0.1–1.0)
Monocytes Relative: 6.1 % (ref 3.0–12.0)
Neutro Abs: 4.3 10*3/uL (ref 1.4–7.7)
Neutrophils Relative %: 72.8 % (ref 43.0–77.0)
Platelets: 373 10*3/uL (ref 150.0–400.0)
RBC: 4.18 Mil/uL (ref 3.87–5.11)
RDW: 15.8 % — ABNORMAL HIGH (ref 11.5–15.5)
WBC: 5.9 10*3/uL (ref 4.0–10.5)

## 2022-11-16 LAB — TSH: TSH: 1.23 u[IU]/mL (ref 0.35–5.50)

## 2022-11-16 MED ORDER — BUSPIRONE HCL 7.5 MG PO TABS: 7.5000 mg | ORAL_TABLET | Freq: Two times a day (BID) | ORAL | 3 refills | Status: DC

## 2022-11-16 NOTE — Progress Notes (Signed)
   Subjective:    Patient ID: Krystal Harris, female    DOB: [birth year], 37 y.o.   MRN: [id number]  HPI Anxiety- ongoing issue, currently on Wellbutrin '150mg'$  daily and Pristiq '50mg'$  daily w/ Alprazolam to use prn.  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  'i'm struggling w/ how to cope w/ everything'.  Currently in therapy every other week.    Hair loss- pt notes that she has had increased hair loss x3-4 weeks.     Review of Systems For ROS see HPI     Objective:   Physical Exam Vitals reviewed.  Constitutional:      General: She is not in acute distress.    Appearance: Normal appearance. She is obese. She is not ill-appearing.  HENT:     Head: Normocephalic and atraumatic.     Comments: Thinning hair at temples, R>L Skin:    General: Skin is warm and dry.  Neurological:     General: No focal deficit present.     Mental Status: She is alert and oriented to person, place, and time.  Psychiatric:        Behavior: Behavior normal.        Thought Content: Thought content normal.     Comments: tearful           Assessment & Plan:  Hair loss- new.  Pt is having hair loss at both temples, R>L.  Suspect this may be stress related but will check labs to assess thyroid and iron levels.

## 2022-11-16 NOTE — Patient Instructions (Signed)
Follow up in 3-4 weeks to recheck anxiety We'll notify you of your lab results and make any changes if needed CONTINUE the Wellbutrin and Pristiq daily ADD the Buspar twice daily to help w/ the additional anxiety right now Call with any questions or concerns Stay Safe!  Stay Healthy! Hang in there!!!

## 2022-11-16 NOTE — Assessment & Plan Note (Signed)
Deteriorated.  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  Reports she was feeling better on the Wellbutrin '150mg'$  daily and the Pristiq '50mg'$  until she got this news.  Pt is still in counseling every other week and encouraged her to work on [employer], therapeutic.  Discussed that change is always difficult and job uncertainty is very stressful.  Hopefully this is just a temporary bump in the road and things will get back on track once she has more answers.  Will add Buspar 7.'5mg'$  BID until anxiety improves.  Pt expressed understanding and is in agreement w/ plan.

## 2022-11-17 ENCOUNTER — Telehealth: Payer: Self-pay

## 2022-11-17 DIAGNOSIS — E611 Iron deficiency: Secondary | ICD-10-CM

## 2022-11-17 LAB — IRON,TIBC AND FERRITIN PANEL
%SAT: 8 % (calc) — ABNORMAL LOW (ref 16–45)
Ferritin: 8 ng/mL — ABNORMAL LOW (ref 16–154)
Iron: 30 ug/dL — ABNORMAL LOW (ref 40–190)
TIBC: 360 mcg/dL (calc) (ref 250–450)

## 2022-11-17 NOTE — Telephone Encounter (Signed)
Notes she has been taking the recommended extra iron supplement daily should she add more?   Also prystique is a bit expensive since she now she has not met her deductible notes other Rx are $5 and prystiqu is $30 did not know if there was an affordable alternative

## 2022-11-17 NOTE — Telephone Encounter (Signed)
Patient informed and was agreeable.

## 2022-11-17 NOTE — Telephone Encounter (Signed)
Called patient LM  to call back   Wanted to advise her to take iron supplement 2 times daily and to use [facility] in addition to insurance for the pristique as this prings the out of pocket cost to about $9

## 2022-11-17 NOTE — Telephone Encounter (Signed)
Since she uses [facility] it appears there's a rebate or program on [place] that it would be $13/month

## 2022-11-17 NOTE — Telephone Encounter (Signed)
-----   Message from [name], MD sent at 11/16/2022  3:37 PM EST ----- Labs look good!  Waiting on iron panel

## 2022-11-17 NOTE — Telephone Encounter (Signed)
We don't want to exceed '45mg'$  of iron.  Since she is already taking high levels of iron, I would like to refer her to hematology (dx iron deficiency) for them to do a complete evaluation and determine what else is needed (as long as she is agreeable)

## 2022-11-17 NOTE — Telephone Encounter (Signed)
Whoops!  Yes, she should increase iron supplement to twice daily

## 2022-11-17 NOTE — Telephone Encounter (Signed)
What about the additional iron? Should she be adding more since she is still low but taking the supplement daily?

## 2022-11-17 NOTE — Addendum Note (Signed)
Addended by: Patrcia Dolly on: 11/17/2022 02:06 PM   Modules accepted: Orders

## 2022-11-17 NOTE — Telephone Encounter (Signed)
Pt notes she takes the vitamin 2 times daily already should she take 4 a day?  She is totaling about 36 mg daily, how should she continue?

## 2022-11-17 NOTE — Telephone Encounter (Signed)
Referral placed.

## 2022-11-21 ENCOUNTER — Telehealth: Payer: Self-pay | Admitting: Hematology and Oncology

## 2022-11-21 NOTE — Telephone Encounter (Signed)
Scheduled appt per 2/5 referral. Pt is aware of appt date and time. Pt is aware to arrive 15 mins prior to appt time and to bring and updated insurance card. Pt is aware of appt location.   

## 2022-12-11 ENCOUNTER — Encounter: Payer: Self-pay | Admitting: Family Medicine

## 2022-12-11 ENCOUNTER — Inpatient Hospital Stay: Payer: BC Managed Care – PPO | Attending: Hematology and Oncology | Admitting: Hematology and Oncology

## 2022-12-11 ENCOUNTER — Other Ambulatory Visit: Payer: Self-pay | Admitting: Hematology and Oncology

## 2022-12-11 ENCOUNTER — Telehealth (INDEPENDENT_AMBULATORY_CARE_PROVIDER_SITE_OTHER): Payer: BC Managed Care – PPO | Admitting: Family Medicine

## 2022-12-11 ENCOUNTER — Inpatient Hospital Stay: Payer: BC Managed Care – PPO

## 2022-12-11 VITALS — Ht 64.0 in | Wt 279.0 lb

## 2022-12-11 DIAGNOSIS — F32A Depression, unspecified: Secondary | ICD-10-CM

## 2022-12-11 DIAGNOSIS — D500 Iron deficiency anemia secondary to blood loss (chronic): Secondary | ICD-10-CM | POA: Insufficient documentation

## 2022-12-11 DIAGNOSIS — F419 Anxiety disorder, unspecified: Secondary | ICD-10-CM | POA: Diagnosis not present

## 2022-12-11 DIAGNOSIS — N920 Excessive and frequent menstruation with regular cycle: Secondary | ICD-10-CM | POA: Insufficient documentation

## 2022-12-11 LAB — CBC WITH DIFFERENTIAL (CANCER CENTER ONLY)
Abs Immature Granulocytes: 0.02 10*3/uL (ref 0.00–0.07)
Basophils Absolute: 0 10*3/uL (ref 0.0–0.1)
Basophils Relative: 0 %
Eosinophils Absolute: 0.2 10*3/uL (ref 0.0–0.5)
Eosinophils Relative: 3 %
HCT: 35.2 % — ABNORMAL LOW (ref 36.0–46.0)
Hemoglobin: 11.4 g/dL — ABNORMAL LOW (ref 12.0–15.0)
Immature Granulocytes: 0 %
Lymphocytes Relative: 22 %
Lymphs Abs: 1.3 10*3/uL (ref 0.7–4.0)
MCH: 27.3 pg (ref 26.0–34.0)
MCHC: 32.4 g/dL (ref 30.0–36.0)
MCV: 84.4 fL (ref 80.0–100.0)
Monocytes Absolute: 0.4 10*3/uL (ref 0.1–1.0)
Monocytes Relative: 6 %
Neutro Abs: 4 10*3/uL (ref 1.7–7.7)
Neutrophils Relative %: 69 %
Platelet Count: 364 10*3/uL (ref 150–400)
RBC: 4.17 MIL/uL (ref 3.87–5.11)
RDW: 14 % (ref 11.5–15.5)
WBC Count: 5.8 10*3/uL (ref 4.0–10.5)
nRBC: 0 % (ref 0.0–0.2)

## 2022-12-11 LAB — CMP (CANCER CENTER ONLY)
ALT: 7 U/L (ref 0–44)
AST: 12 U/L — ABNORMAL LOW (ref 15–41)
Albumin: 3.7 g/dL (ref 3.5–5.0)
Alkaline Phosphatase: 95 U/L (ref 38–126)
Anion gap: 4 — ABNORMAL LOW (ref 5–15)
BUN: 11 mg/dL (ref 6–20)
CO2: 27 mmol/L (ref 22–32)
Calcium: 8.8 mg/dL — ABNORMAL LOW (ref 8.9–10.3)
Chloride: 106 mmol/L (ref 98–111)
Creatinine: 0.69 mg/dL (ref 0.44–1.00)
GFR, Estimated: >60 mL/min (ref >60)
Glucose, Bld: 84 mg/dL (ref 70–99)
Potassium: 4.2 mmol/L (ref 3.5–5.1)
Sodium: 137 mmol/L (ref 135–145)
Total Bilirubin: 0.3 mg/dL (ref 0.3–1.2)
Total Protein: 7.9 g/dL (ref 6.5–8.1)

## 2022-12-11 LAB — IRON AND IRON BINDING CAPACITY (CC-WL,HP ONLY)
Iron: 46 ug/dL (ref 28–170)
Saturation Ratios: 13 % (ref 10.4–31.8)
TIBC: 365 ug/dL (ref 250–450)
UIBC: 319 ug/dL (ref 148–442)

## 2022-12-11 LAB — RETIC PANEL
Immature Retic Fract: 13.2 % (ref 2.3–15.9)
RBC.: 4.19 MIL/uL (ref 3.87–5.11)
Retic Count, Absolute: 33.5 10*3/uL (ref 19.0–186.0)
Retic Ct Pct: 0.8 % (ref 0.4–3.1)
Reticulocyte Hemoglobin: 29.4 pg (ref >27.9)

## 2022-12-11 LAB — FERRITIN: Ferritin: 11 ng/mL (ref 11–307)

## 2022-12-11 NOTE — Progress Notes (Signed)
[place] [phone]   [phone] [place] NOTE  Patient Care Team: [name], MD as PCP - General (Family Medicine) Servando Salina, MD as Consulting Physician (Obstetrics and Gynecology)  Hematological/Oncological History # Iron Deficiency Anemia 2/2 to GYN Bleeding 06/06/2022: WBC 6.8, Hgb 9.7, MCV 78, Plt 436, ferritin 5, iron sat 6% 11/16/2022: WBC 5.9, Hgb 11.2, MCV 84, Plt 373, Iron sat 8%, ferritin 8 12/11/2022: establish care with Dr. Lorenso Courier   CHIEF COMPLAINTS/PURPOSE OF CONSULTATION:  " Iron Deficiency Anemia 2/2 to GYN Bleeding "  HISTORY OF PRESENTING ILLNESS:  Krystal Harris 37 y.o. female with medical history significant for depression and vitamin D deficiency who presents for evaluation of iron deficiency anemia secondary to GYN bleeding.  On review of the previous records Krystal Harris had labs drawn on 06/06/2022 which showed WBC 6.8, Hgb 9.7, MCV 78, Plt 436, ferritin 5, iron sat 6%. Subsequent labs on 11/16/2022 showed WBC 5.9, Hgb 11.2, MCV 84, Plt 373, Iron sat 8%, ferritin 8. Due to concern for persistent iron deficiency she was referred to hematology for further evaluation and management.   On exam today Krystal Harris reports that she has had iron deficiency "forever".  She notes that it runs in her family and her maternal grandfather as well as sister have had problems with their blood counts.  She reports that she has taken iron pills before in the past but it causes stomach discomfort.  She had a Niesen fundoplication a few years ago which also may be contributing to her difficulties with iron pills.  She notes that due to difficulties with the pills that she transition to Flintstones vitamin that takes 2 of these per day.  She notes that she used to have quite heavy menstrual cycles but underwent a fibroidectomy in August 2023 and since that time her periods have a much lighter.  Before that she was changing her  tampon every 2 hours but now she is changing it every 6-8 hours.  She notes that she does enjoy eating red meat and had a steak last night.  She is not having any lightheadedness, dizziness or shortness of breath but does not endorse having fatigue as well as occasional headache and hair loss/thinning.  She reports that she is never before received an iron infusion or blood transfusion.  On further discussion she reports that her maternal grandfather and sister had anemia.  Her maternal grandfather also had lung cancer.  Her mother had issues with thyroid disease and her uncle has liver cancer.  She reports another uncle has kidney cancer.  She is a never smoker and drinks alcohol about 2 times per month.  She is currently employed as a [employer].  She otherwise denies any fevers, chills, sweats, nausea, vomiting or diarrhea.  A full 10 point ROS was otherwise negative.  MEDICAL HISTORY:  Past Medical History:  Diagnosis Date   Anemia    Anxiety    Chicken pox    Depression    Gallbladder problem    GERD (gastroesophageal reflux disease)    PONV (postoperative nausea and vomiting)    vomiting after surgery gallbladder removal 2016   Vitamin D deficiency     SURGICAL HISTORY: Past Surgical History:  Procedure Laterality Date   BRAVO [place] STUDY N/A 01/22/2020   Procedure: BRAVO [place] STUDY;  Surgeon: Milus Banister, MD;  Location: [facility];  Service: Endoscopy;  Laterality: N/A;   CHOLECYSTECTOMY  laprascopic   CYST EXCISION Right 12/29/2016   Procedure: EXCISION OF RIGHT AXILLARY CYST;  Surgeon: [name], MD;  Location: [facility];  Service: General;  Laterality: Right;   DILATATION & CURETTAGE/HYSTEROSCOPY WITH MYOSURE N/A 05/05/2022   Procedure: [place];  Surgeon: Servando Salina, MD;  Location: [place];  Service: Gynecology;  Laterality: N/A;   ESOPHAGOGASTRODUODENOSCOPY N/A 08/04/2020   Procedure:  ESOPHAGOGASTRODUODENOSCOPY (EGD) with balloon dilation;  Surgeon: [name], MD;  Location: [facility];  Service: General;  Laterality: N/A;   ESOPHAGOGASTRODUODENOSCOPY (EGD) WITH PROPOFOL N/A 01/22/2020   Procedure: ESOPHAGOGASTRODUODENOSCOPY (EGD) WITH PROPOFOL;  Surgeon: Milus Banister, MD;  Location: [facility];  Service: Endoscopy;  Laterality: N/A;   FOOT SURGERY  02/2015   scar tissue and keloid scar removal   FOREIGN BODY REMOVAL Right 09/17/2013   Procedure: REMOVAL FOREIGN BODY NEEDLE PLANTAR ASPECT OF THE FOOT;  Surgeon: Newt Minion, MD;  Location: [facility];  Service: Orthopedics;  Laterality: Right;  Remove Foreign Body Right Foot   LAPAROSCOPIC NISSEN FUNDOPLICATION  [id number]   WISDOM TOOTH EXTRACTION      SOCIAL HISTORY: Social History   Socioeconomic History   Marital status: Single    Spouse name: Not on file   Number of children: Not on file   Years of education: Not on file   Highest education level: Not on file  Occupational History   Not on file  Tobacco Use   Smoking status: Never   Smokeless tobacco: Never  Vaping Use   Vaping Use: Never used  Substance and Sexual Activity   Alcohol use: Yes    Comment: occasionally   Drug use: No   Sexual activity: Yes    Birth control/protection: Pill  Other Topics Concern   Not on file  Social History Narrative   Not on file   Social Determinants of Health   Financial Resource Strain: Not on file  Food Insecurity: Not on file  Transportation Needs: Not on file  Physical Activity: Not on file  Stress: Not on file  Social Connections: Not on file  Intimate Partner Violence: Not on file    FAMILY HISTORY: Family History  Problem Relation Age of Onset   Diabetes Mother    Hypertension Mother    Thyroid disease Mother    Obesity Mother    Stroke Maternal Grandmother    Diabetes Maternal Grandfather    Stroke Maternal Grandfather    Hypertension Maternal Grandfather    Lung cancer Maternal  Grandfather    Stomach cancer Neg Hx    Colon cancer Neg Hx    Rectal cancer Neg Hx    Pancreatic cancer Neg Hx     ALLERGIES:  is allergic to penicillins.  MEDICATIONS:  Current Outpatient Medications  Medication Sig Dispense Refill   ALPRAZolam (XANAX) 0.5 MG tablet TAKE 1 TABLET BY MOUTH 2 TIMES DAILY AS NEEDED FOR ANXIETY. 30 tablet 0   buPROPion (WELLBUTRIN XL) 150 MG 24 hr tablet Take 1 tablet (150 mg total) by mouth daily. 10 tablet 0   busPIRone (BUSPAR) 7.5 MG tablet Take 1 tablet (7.5 mg total) by mouth 2 (two) times daily. 60 tablet 3   Cholecalciferol (VITAMIN D3 PO) Take 3,000 Units by mouth daily at 12 noon.      desvenlafaxine (PRISTIQ) 50 MG 24 hr tablet Take 1 tablet (50 mg total) by mouth daily. 30 tablet 3   ibuprofen (ADVIL) 800 MG tablet Take 1 tablet (800  mg total) by mouth every 8 (eight) hours as needed for moderate pain, mild pain or cramping. 30 tablet 7   Pediatric Multiple Vitamins (FLINSTONES GUMMIES OMEGA-3 DHA PO) Take by mouth.     traZODone (DESYREL) 100 MG tablet TAKE 1 TABLET BY MOUTH EVERYDAY AT BEDTIME 30 tablet 1   No current facility-administered medications for this visit.    REVIEW OF SYSTEMS:   Constitutional: ( - ) fevers, ( - )  chills , ( - ) night sweats Eyes: ( - ) blurriness of vision, ( - ) double vision, ( - ) watery eyes Ears, nose, mouth, throat, and face: ( - ) mucositis, ( - ) sore throat Respiratory: ( - ) cough, ( - ) dyspnea, ( - ) wheezes Cardiovascular: ( - ) palpitation, ( - ) chest discomfort, ( - ) lower extremity swelling Gastrointestinal:  ( - ) nausea, ( - ) heartburn, ( - ) change in bowel habits Skin: ( - ) abnormal skin rashes Lymphatics: ( - ) new lymphadenopathy, ( - ) easy bruising Neurological: ( - ) numbness, ( - ) tingling, ( - ) new weaknesses Behavioral/Psych: ( - ) mood change, ( - ) new changes  All other systems were reviewed with the patient and are negative.  PHYSICAL EXAMINATION:  There were no  vitals filed for this visit. There were no vitals filed for this visit.  GENERAL: well appearing middle-aged African-American female in NAD  SKIN: skin color, texture, turgor are normal, no rashes or significant lesions EYES: conjunctiva are pink and non-injected, sclera clear LUNGS: clear to auscultation and percussion with normal breathing effort HEART: regular rate & rhythm and no murmurs and no lower extremity edema Musculoskeletal: no cyanosis of digits and no clubbing  PSYCH: alert & oriented x 3, fluent speech NEURO: no focal motor/sensory deficits  LABORATORY DATA:  I have reviewed the data as listed    Latest Ref Rng & Units 11/16/2022    9:14 AM 06/06/2022    5:00 PM 05/05/2022    7:19 AM  CBC  WBC 4.0 - 10.5 K/uL 5.9  6.8  6.6   Hemoglobin 12.0 - 15.0 g/dL 11.2  9.7  9.7   Hematocrit 36.0 - 46.0 % 35.1  30.2  30.9   Platelets 150.0 - 400.0 K/uL 373.0  436.0  424        Latest Ref Rng & Units 11/11/2021    3:05 PM 01/07/2021    8:03 AM 06/02/2020   10:11 AM  CMP  Glucose 65 - 99 mg/dL 75  90  85   BUN 7 - 25 mg/dL '9  7  5   '$ Creatinine 0.50 - 0.97 mg/dL 0.70  0.72  0.73   Sodium 135 - 146 mmol/L 138  136  137   Potassium 3.5 - 5.3 mmol/L 3.6  4.0  4.0   Chloride 98 - 110 mmol/L 106  104  106   CO2 20 - 32 mmol/L '26  24  20   '$ Calcium 8.6 - 10.2 mg/dL 9.3  9.3  9.0   Total Protein 6.1 - 8.1 g/dL 7.7  8.1    Total Bilirubin 0.2 - 1.2 mg/dL 0.2  0.2    Alkaline Phos 39 - 117 U/L  98    AST 10 - 30 U/L 13  17    ALT 6 - 29 U/L 8  12       ASSESSMENT & PLAN Krystal Harris 37 y.o. female with medical  history significant for depression and vitamin D deficiency who presents for evaluation of iron deficiency anemia secondary to GYN bleeding.  After review of the labs, review of the records, and discussion with the patient the patients findings are most consistent with iron deficiency anemia secondary to GYN bleeding.  # Iron Deficiency Anemia 2/2 to GYN Bleeding --  Findings are consistent with iron deficiency anemia secondary to patient's menorrhagia --Encouraged her to follow-up with OB/GYN for better control of her menstrual cycles --We will confirm iron deficiency anemia by ordering iron panel and ferritin as well as reticulocytes, CBC, and CMP --unable to tolerate ferrous sulfate 325 mg due to GI upset.  --We will plan to proceed with IV iron therapy in order to help bolster the patient's blood counts --Plan for return to clinic in 4 to 6 weeks time after last dose of IV iron   Orders Placed This Encounter  Procedures   CBC with Differential ([facility] Only)    Standing Status:   Future    Standing Expiration Date:   12/12/2023   CMP ([facility] only)    Standing Status:   Future    Standing Expiration Date:   12/12/2023   Ferritin    Standing Status:   Future    Standing Expiration Date:   12/12/2023   Iron and Iron Binding Capacity ([facility] only)    Standing Status:   Future    Standing Expiration Date:   12/12/2023   Retic Panel    Standing Status:   Future    Standing Expiration Date:   12/12/2023    All questions were answered. The patient knows to call the clinic with any problems, questions or concerns.  A total of more than 60 minutes were spent on this encounter with face-to-face time and non-face-to-face time, including preparing to see the patient, ordering tests and/or medications, counseling the patient and coordination of care as outlined above.   Ledell Peoples, MD Department of Hematology/Oncology [place] at [facility] Phone: [phone] Pager: [phone] Email: [email or web address]  12/11/2022 9:51 AM

## 2022-12-11 NOTE — Progress Notes (Signed)
Virtual Visit via Video   I connected with patient on 12/11/22 at  8:00 AM EST by a video enabled telemedicine application and verified that I am speaking with the correct person using two identifiers.  Location patient: Home Location provider: [name], Office Persons participating in the virtual visit: Patient, Provider, Maxwell Marcille Blanco C)  I discussed the limitations of evaluation and management by telemedicine and the availability of in person appointments. The patient expressed understanding and agreed to proceed.  Subjective:   HPI:   Anxiety/Depression- at last visit we added Buspar 7.'5mg'$  BID.  This was in addition to her Pristiq '50mg'$  daily and Wellbutrin '150mg'$  daily.  Pt reports she's 'doing better'.  Pt is doing therapy every other week.  Has an appt this week.  Sometimes will forget to take the 2nd Buspar but overall feels less teary and overwhelmed.  Pt would like to keep things as they are right now.  Obesity- pt is interested in restarting supervised weightloss.  See pertinent positives and negatives per HPI.  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 tube) procedure 06/01/2020   GERD (gastroesophageal reflux disease) 05/01/2019   Knee osteoarthritis 09/12/2017   Morbid obesity ([place]) 08/13/2017   Anxiety and depression 04/11/2017   Insomnia 04/11/2017   Fatty liver 09/27/2016   Physical exam 02/21/2013    Social History   Tobacco Use   Smoking status: Never   Smokeless tobacco: Never  Substance Use Topics   Alcohol use: Yes    Comment: occasionally    Current Outpatient Medications:    ALPRAZolam (XANAX) 0.5 MG tablet, TAKE 1 TABLET BY MOUTH 2 TIMES DAILY AS NEEDED FOR ANXIETY., Disp: 30 tablet, Rfl: 0   buPROPion (WELLBUTRIN XL) 150 MG 24 hr tablet, Take 1  tablet (150 mg total) by mouth daily., Disp: 10 tablet, Rfl: 0   busPIRone (BUSPAR) 7.5 MG tablet, Take 1 tablet (7.5 mg total) by mouth 2 (two) times daily., Disp: 60 tablet, Rfl: 3   Cholecalciferol (VITAMIN D3 PO), Take 3,000 Units by mouth daily at 12 noon. , Disp: , Rfl:    desvenlafaxine (PRISTIQ) 50 MG 24 hr tablet, Take 1 tablet (50 mg total) by mouth daily., Disp: 30 tablet, Rfl: 3   ibuprofen (ADVIL) 800 MG tablet, Take 1 tablet (800 mg total) by mouth every 8 (eight) hours as needed for moderate pain, mild pain or cramping., Disp: 30 tablet, Rfl: 7   Pediatric Multiple Vitamins (FLINSTONES GUMMIES OMEGA-3 DHA PO), Take by mouth., Disp: , Rfl:    traZODone (DESYREL) 100 MG tablet, TAKE 1 TABLET BY MOUTH EVERYDAY AT BEDTIME, Disp: 30 tablet, Rfl: 1  Allergies  Allergen Reactions   Penicillins Hives, Nausea And Vomiting and Rash    Has patient had a PCN reaction causing immediate rash, facial/tongue/throat swelling, SOB or lightheadedness with hypotension: no Has patient had a PCN reaction causing severe rash involving mucus membranes or skin necrosis: unknown Has patient had a PCN reaction that required hospitalization unknown Has patient had a PCN reaction occurring within the last 10 years: unknown If all of the above answers are "NO", then may proceed with Cephalosporin use.  Has patient had a PCN reaction causing immediate rash, facial/tongue/throat swelling, SOB or lightheadedness with hypotension: no Has patient had a PCN reaction causing severe rash involving mucus membranes or  skin necrosis: unknown Has patient had a PCN reaction that required hospitalization unknown Has patient had a PCN reaction occurring within the last 10 years: unknown If all of the above answers are "NO", then may proceed with Cephalosporin use. Has patient had a PCN reaction causing immediat    Objective:   Ht '5\' 4"'$  (1.626 m)   Wt 279 lb (126.6 kg)   BMI 47.89 kg/m  AAOx3, NAD NCAT, EOMI No  obvious CN deficits Coloring WNL Pt is able to speak clearly, coherently without shortness of breath or increased work of breathing.  Thought process is linear.  Mood is appropriate.   Assessment and Plan:   Anxiety/depression- improved since add Buspar to her Wellbutrin and Pristiq.  Pt wants to keep meds as they are for now as this is the best she has felt in awhile.  No changes at this time and will provide refills prn.  Obesity- pt would like to resume supervised medical weight loss as it has been a number of years since she has done this.  Referral placed to [facility]   [name], MD 12/11/2022

## 2022-12-12 ENCOUNTER — Telehealth: Payer: Self-pay | Admitting: Hematology and Oncology

## 2022-12-12 ENCOUNTER — Other Ambulatory Visit: Payer: Self-pay | Admitting: Family Medicine

## 2022-12-12 DIAGNOSIS — G4700 Insomnia, unspecified: Secondary | ICD-10-CM

## 2022-12-12 NOTE — Telephone Encounter (Signed)
Called patient per 2/26 los notes to schedule f/u. Patient scheduled and notified.

## 2022-12-13 ENCOUNTER — Other Ambulatory Visit: Payer: Self-pay | Admitting: Hematology and Oncology

## 2022-12-18 ENCOUNTER — Other Ambulatory Visit: Payer: Self-pay | Admitting: Pharmacy Technician

## 2022-12-18 ENCOUNTER — Telehealth: Payer: Self-pay | Admitting: Pharmacy Technician

## 2022-12-18 NOTE — Telephone Encounter (Signed)
Dr. Lorenso Courier, Monoferric has been denied. Would you like to try feraheme?  Auth Submission: NO AUTH NEEDED Payer: [employer] [place] Medication & CPT/J Code(s) submitted: Feraheme (ferumoxytol) [id number] Route of submission (phone, fax, portal):  Phone # Fax # Auth type: Buy/Bill Units/visits requested: 2 Reference number: [id number] 10:09 Approval from: 12/18/22 to  04/19/23

## 2022-12-25 ENCOUNTER — Ambulatory Visit (INDEPENDENT_AMBULATORY_CARE_PROVIDER_SITE_OTHER): Payer: BC Managed Care – PPO | Admitting: *Deleted

## 2022-12-25 VITALS — BP 136/83 | HR 84 | Temp 98.1°F | Resp 16 | Ht 64.0 in | Wt 285.8 lb

## 2022-12-25 DIAGNOSIS — D500 Iron deficiency anemia secondary to blood loss (chronic): Secondary | ICD-10-CM | POA: Diagnosis not present

## 2022-12-25 DIAGNOSIS — N920 Excessive and frequent menstruation with regular cycle: Secondary | ICD-10-CM

## 2022-12-25 MED ORDER — SODIUM CHLORIDE 0.9 % IV SOLN
510.0000 mg | Freq: Once | INTRAVENOUS | Status: AC
  Administered 2022-12-25: 510 mg via INTRAVENOUS
  Filled 2022-12-25: qty 17

## 2022-12-25 MED ORDER — DIPHENHYDRAMINE HCL 25 MG PO CAPS
25.0000 mg | ORAL_CAPSULE | Freq: Once | ORAL | Status: AC
  Administered 2022-12-25: 25 mg via ORAL
  Filled 2022-12-25: qty 1

## 2022-12-25 MED ORDER — ACETAMINOPHEN 325 MG PO TABS
650.0000 mg | ORAL_TABLET | Freq: Once | ORAL | Status: AC
  Administered 2022-12-25: 650 mg via ORAL
  Filled 2022-12-25: qty 2

## 2022-12-25 NOTE — Progress Notes (Signed)
Diagnosis: Iron Deficiency Anemia  Provider:  Marshell Garfinkel MD  Procedure: Infusion  IV Type: Peripheral, IV Location: R Forearm  Feraheme (Ferumoxytol), Dose: 510 mg  Infusion Start Time: [id number] am  Infusion Stop Time: [id number] am  Post Infusion IV Care: Observation period completed and Peripheral IV Discontinued  Discharge: Condition: Good, Destination: Home . AVS Provided and AVS Declined  Performed by:  Oren Beckmann, RN

## 2023-01-01 ENCOUNTER — Ambulatory Visit (INDEPENDENT_AMBULATORY_CARE_PROVIDER_SITE_OTHER): Payer: BC Managed Care – PPO

## 2023-01-01 VITALS — BP 119/79 | HR 89 | Temp 98.4°F | Resp 18 | Ht 64.0 in | Wt 285.2 lb

## 2023-01-01 DIAGNOSIS — D500 Iron deficiency anemia secondary to blood loss (chronic): Secondary | ICD-10-CM | POA: Diagnosis not present

## 2023-01-01 DIAGNOSIS — N920 Excessive and frequent menstruation with regular cycle: Secondary | ICD-10-CM | POA: Diagnosis not present

## 2023-01-01 MED ORDER — ACETAMINOPHEN 325 MG PO TABS
650.0000 mg | ORAL_TABLET | Freq: Once | ORAL | Status: AC
  Administered 2023-01-01: 650 mg via ORAL
  Filled 2023-01-01: qty 2

## 2023-01-01 MED ORDER — DIPHENHYDRAMINE HCL 25 MG PO CAPS
25.0000 mg | ORAL_CAPSULE | Freq: Once | ORAL | Status: AC
  Administered 2023-01-01: 25 mg via ORAL
  Filled 2023-01-01: qty 1

## 2023-01-01 MED ORDER — SODIUM CHLORIDE 0.9 % IV SOLN
510.0000 mg | Freq: Once | INTRAVENOUS | Status: AC
  Administered 2023-01-01: 510 mg via INTRAVENOUS
  Filled 2023-01-01: qty 17

## 2023-01-01 NOTE — Progress Notes (Signed)
Diagnosis: Iron Deficiency Anemia  Provider:  Marshell Garfinkel MD  Procedure: Infusion  IV Type: Peripheral, IV Location: R Antecubital  Feraheme (Ferumoxytol), Dose: 510 mg  Infusion Start Time: 0918  Infusion Stop Time: [id number]  Post Infusion IV Care: Peripheral IV Discontinued  Discharge: Condition: Good, Destination: Home . AVS Declined  Performed by:  Cleophus Molt, RN

## 2023-01-10 ENCOUNTER — Encounter (INDEPENDENT_AMBULATORY_CARE_PROVIDER_SITE_OTHER): Payer: Self-pay | Admitting: Family Medicine

## 2023-01-11 ENCOUNTER — Encounter: Payer: BC Managed Care – PPO | Admitting: Bariatrics

## 2023-01-24 ENCOUNTER — Encounter: Payer: BC Managed Care – PPO | Admitting: Nurse Practitioner

## 2023-01-25 ENCOUNTER — Ambulatory Visit
Admission: EM | Admit: 2023-01-25 | Discharge: 2023-01-25 | Disposition: A | Discharge status: Home / Self Care | Payer: BC Managed Care – PPO | Attending: Nurse Practitioner | Admitting: Nurse Practitioner

## 2023-01-25 DIAGNOSIS — S91311A Laceration without foreign body, right foot, initial encounter: Secondary | ICD-10-CM | POA: Diagnosis not present

## 2023-01-25 DIAGNOSIS — Z23 Encounter for immunization: Secondary | ICD-10-CM | POA: Diagnosis not present

## 2023-01-25 MED ORDER — TETANUS-DIPHTH-ACELL PERTUSSIS 5-2.5-18.5 LF-MCG/0.5 IM SUSY
0.5000 mL | PREFILLED_SYRINGE | Freq: Once | INTRAMUSCULAR | Status: AC
  Administered 2023-01-25: 0.5 mL via INTRAMUSCULAR

## 2023-01-25 MED ORDER — MUPIROCIN 2 % EX OINT
1.0000 | TOPICAL_OINTMENT | Freq: Two times a day (BID) | CUTANEOUS | 0 refills | Status: AC
Start: 2023-01-25 — End: 2023-02-01

## 2023-01-25 NOTE — ED Triage Notes (Signed)
Patient presents to UC for foot injury from stepping on glass today about 20 mins ago. Cleaned foot with soap and water. Last Tdap 2005.

## 2023-01-25 NOTE — Discharge Instructions (Signed)
Keep area clean and dry Your tetanus was updated in clinic and is good for 10 years Topical mupirocin antibiotic ointment to your foot twice daily for 7 days Monitor for any signs of infection which include but is not limited to swelling, drainage, warmth, fevers, chills and please seek reevaluation if these occur Follow-up with your PCP in 2 days for recheck Please go to the ER for any worsening symptoms

## 2023-01-25 NOTE — ED Provider Notes (Signed)
[facility]    CSN: [id number] Arrival date & time: 01/25/23  1941      History   Chief Complaint Chief Complaint  Patient presents with   Foot Injury    HPI Krystal Harris is a 37 y.o. female presents for foot injury after stepping on some glass.  Patient reports about 20 minutes prior to arrival she stepped on a broken glass straw.  She did sustain a small cut to the plantar aspect of her right foot.  She states he cleaned it really well and cannot see any visible glass but wanted to be sure nothing was there.  Did have some minor bleeding.  She is not on blood thinning medications.  She is not to date on her tetanus and states her last 1 was 2005.  No other injuries or concerns at this time.   Foot Injury   Past Medical History:  Diagnosis Date   Anemia    Anxiety    Chicken pox    Depression    Gallbladder problem    GERD (gastroesophageal reflux disease)    PONV (postoperative nausea and vomiting)    vomiting after surgery gallbladder removal 2016   Vitamin D deficiency     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 tube) procedure 06/01/2020   GERD (gastroesophageal reflux disease) 05/01/2019   Knee osteoarthritis 09/12/2017   Morbid obesity 08/13/2017   Anxiety and depression 04/11/2017   Insomnia 04/11/2017   Fatty liver 09/27/2016   Physical exam 02/21/2013    Past Surgical History:  Procedure Laterality Date   BRAVO PH STUDY N/A 01/22/2020   Procedure: BRAVO PH STUDY;  Surgeon: Rachael Fee, MD;  Location: [facility];  Service: Endoscopy;  Laterality: N/A;   CHOLECYSTECTOMY     laprascopic   CYST EXCISION Right 12/29/2016   Procedure: EXCISION OF RIGHT AXILLARY CYST;  Surgeon: Axel Filler, MD;  Location: [facility];  Service: General;  Laterality:  Right;   DILATATION & CURETTAGE/HYSTEROSCOPY WITH MYOSURE N/A 05/05/2022   Procedure: DILATATION & CURETTAGE/HYSTEROSCOPY WITH MYOSURE;  Surgeon: Maxie Better, MD;  Location: [facility];  Service: Gynecology;  Laterality: N/A;   ESOPHAGOGASTRODUODENOSCOPY N/A 08/04/2020   Procedure: ESOPHAGOGASTRODUODENOSCOPY (EGD) with balloon dilation;  Surgeon: Axel Filler, MD;  Location: [facility];  Service: General;  Laterality: N/A;   ESOPHAGOGASTRODUODENOSCOPY (EGD) WITH PROPOFOL N/A 01/22/2020   Procedure: ESOPHAGOGASTRODUODENOSCOPY (EGD) WITH PROPOFOL;  Surgeon: Rachael Fee, MD;  Location: [facility];  Service: Endoscopy;  Laterality: N/A;   FOOT SURGERY  02/2015   scar tissue and keloid scar removal   FOREIGN BODY REMOVAL Right 09/17/2013   Procedure: REMOVAL FOREIGN BODY NEEDLE PLANTAR ASPECT OF THE FOOT;  Surgeon: Nadara Mustard, MD;  Location: [facility];  Service: Orthopedics;  Laterality: Right;  Remove Foreign Body Right Foot   LAPAROSCOPIC NISSEN FUNDOPLICATION  06/01/2020   WISDOM TOOTH EXTRACTION      OB History     Gravida  0   Para  0   Term  0   Preterm  0   AB  0   Living  0      SAB  0   IAB  0   Ectopic  0   Multiple  0   Live Births  0  Home Medications    Prior to Admission medications   Medication Sig Start Date End Date Taking? Authorizing Provider  mupirocin ointment (BACTROBAN) 2 % Apply 1 Application topically 2 (two) times daily for 7 days. 01/25/23 02/01/23 Yes Radford Pax, NP  ALPRAZolam Prudy Feeler) 0.5 MG tablet TAKE 1 TABLET BY MOUTH 2 TIMES DAILY AS NEEDED FOR ANXIETY. 10/10/22   Shade Flood, MD  buPROPion (WELLBUTRIN XL) 150 MG 24 hr tablet TAKE 1 TABLET BY MOUTH EVERY DAY 12/12/22   Sheliah Hatch, MD  busPIRone (BUSPAR) 7.5 MG tablet Take 1 tablet (7.5 mg total) by mouth 2 (two) times daily. 11/16/22   Sheliah Hatch, MD  Cholecalciferol (VITAMIN D3 PO) Take 3,000 Units by mouth daily at 12 noon.      [provider]  desvenlafaxine (PRISTIQ) 50 MG 24 hr tablet Take 1 tablet (50 mg total) by mouth daily. 08/30/22   Sheliah Hatch, MD  ibuprofen (ADVIL) 800 MG tablet Take 1 tablet (800 mg total) by mouth every 8 (eight) hours as needed for moderate pain, mild pain or cramping. 05/05/22   Maxie Better, MD  Pediatric Multiple Vitamins (FLINSTONES GUMMIES OMEGA-3 DHA PO) Take by mouth.    [provider]  traZODone (DESYREL) 100 MG tablet TAKE 1 TABLET BY MOUTH EVERYDAY AT BEDTIME 12/12/22   Sheliah Hatch, MD    Family History Family History  Problem Relation Age of Onset   Diabetes Mother    Hypertension Mother    Thyroid disease Mother    Obesity Mother    Stroke Maternal Grandmother    Diabetes Maternal Grandfather    Stroke Maternal Grandfather    Hypertension Maternal Grandfather    Lung cancer Maternal Grandfather    Stomach cancer Neg Hx    Colon cancer Neg Hx    Rectal cancer Neg Hx    Pancreatic cancer Neg Hx     Social History Social History   Tobacco Use   Smoking status: Never   Smokeless tobacco: Never  Vaping Use   Vaping Use: Never used  Substance Use Topics   Alcohol use: Yes    Comment: occasionally   Drug use: No     Allergies   Penicillins   Review of Systems Review of Systems  Skin:        Cut to right foot     Physical Exam Triage Vital Signs ED Triage Vitals [01/25/23 1949]  Enc Vitals Group     BP (!) 155/95     Pulse Rate (!) 111     Resp 20     Temp 98.9 F (37.2 C)     Temp Source Oral     SpO2 95 %     Weight      Height      Head Circumference      Peak Flow      Pain Score 6     Pain Loc      Pain Edu?      Excl. in GC?    No data found.  Updated Vital Signs BP (!) 155/95 (BP Location: Right Arm)   Pulse (!) 111   Temp 98.9 F (37.2 C) (Oral)   Resp 20   LMP 12/25/2022 (Approximate)   SpO2 95%   Visual Acuity Right Eye Distance:   Left Eye Distance:   Bilateral  Distance:    Right Eye Near:   Left Eye Near:    Bilateral Near:  Physical Exam Vitals and nursing note reviewed.  Constitutional:      Appearance: Normal appearance.  HENT:     Head: Normocephalic and atraumatic.  Eyes:     Pupils: Pupils are equal, round, and reactive to light.  Cardiovascular:     Rate and Rhythm: Tachycardia present.     Comments: Tachycardia 111 Pulmonary:     Effort: Pulmonary effort is normal.  Musculoskeletal:       Feet:  Skin:    General: Skin is warm and dry.  Neurological:     General: No focal deficit present.     Mental Status: She is alert and oriented to person, place, and time.  Psychiatric:        Mood and Affect: Mood normal.        Behavior: Behavior normal.      UC Treatments / Results  Labs (all labs ordered are listed, but only abnormal results are displayed) Labs Reviewed - No data to display  EKG   Radiology No results found.  Procedures Procedures (including critical care time)  Medications Ordered in UC Medications  Tdap (BOOSTRIX) injection 0.5 mL (has no administration in time range)    Initial Impression / Assessment and Plan / UC Course  I have reviewed the triage vital signs and the nursing notes.  Pertinent labs & imaging results that were available during my care of the patient were reviewed by me and considered in my medical decision making (see chart for details).     Reviewed exam and symptoms with patient. No palpable foreign body on exam.  Wound was cleansed and dressed in clinic by nursing staff.  Her Tdap was updated in clinic Mupirocin topically Wound care and signs and symptoms of infection reviewed and patient verbalized understanding PCP follow-up in 2 days for recheck Strict ER precautions reviewed and patient verbalized understanding Final Clinical Impressions(s) / UC Diagnoses   Final diagnoses:  Laceration of right foot, initial encounter     Discharge Instructions       Keep area clean and dry Your tetanus was updated in clinic and is good for 10 years Topical mupirocin antibiotic ointment to your foot twice daily for 7 days Monitor for any signs of infection which include but is not limited to swelling, drainage, warmth, fevers, chills and please seek reevaluation if these occur Follow-up with your PCP in 2 days for recheck Please go to the ER for any worsening symptoms     ED Prescriptions     Medication Sig Dispense Auth. Provider   mupirocin ointment (BACTROBAN) 2 % Apply 1 Application topically 2 (two) times daily for 7 days. 22 g Radford PaxMayer, Jodi R, NP      PDMP not reviewed this encounter.   Radford PaxMayer, Jodi R, NP 01/25/23 2013

## 2023-01-26 ENCOUNTER — Telehealth: Payer: Self-pay | Admitting: Family Medicine

## 2023-01-26 ENCOUNTER — Other Ambulatory Visit: Payer: Self-pay | Admitting: Hematology and Oncology

## 2023-01-26 ENCOUNTER — Inpatient Hospital Stay: Payer: BC Managed Care – PPO

## 2023-01-26 DIAGNOSIS — D500 Iron deficiency anemia secondary to blood loss (chronic): Secondary | ICD-10-CM

## 2023-01-26 NOTE — Telephone Encounter (Signed)
Left pt a VM asking to call and schedule an apt if she is interested in discussing the alternatives .

## 2023-01-26 NOTE — Telephone Encounter (Signed)
We can do both at the same visit

## 2023-01-26 NOTE — Telephone Encounter (Signed)
Noted.  We can schedule an appt to discuss alternatives if she is interested

## 2023-01-26 NOTE — Telephone Encounter (Signed)
Caller name: MELISANDE DEVENNY  On DPR?: Yes  Call back number: [phone] (mobile)  Provider they see: Sheliah Hatch, MD  Reason for call:  Patient called to let Dr.Tabori know that she stop taking desvenlafaxine (PRISTIQ) 50 MG 24 hr tablet. Patient states that her medication cost $30. Patient can't pay $30 dollar a monthly for this medication.

## 2023-01-26 NOTE — Telephone Encounter (Signed)
Spoke with patient. Patient has a ER follow up on 02/01/2023. Patient wants to know if she can discuss alternative medication at that visit. Do she need to schedule separate appt. If so, can it be mychart visit?

## 2023-01-26 NOTE — Telephone Encounter (Signed)
Can pt discuss this at her ER follow up or do we need to schedule a separate visit ?

## 2023-01-26 NOTE — Telephone Encounter (Signed)
Per Dr Beverely Low is ok to do both at her Er f/u

## 2023-01-26 NOTE — Telephone Encounter (Signed)
Pt states she can not afford $ 30 a month so she has stopped the Pristiq.

## 2023-01-29 ENCOUNTER — Other Ambulatory Visit: Payer: Self-pay

## 2023-01-29 ENCOUNTER — Encounter: Payer: Self-pay | Admitting: *Deleted

## 2023-01-29 ENCOUNTER — Telehealth: Payer: Self-pay | Admitting: Hematology and Oncology

## 2023-01-29 ENCOUNTER — Inpatient Hospital Stay: Payer: BC Managed Care – PPO | Attending: Hematology and Oncology

## 2023-01-29 DIAGNOSIS — D500 Iron deficiency anemia secondary to blood loss (chronic): Secondary | ICD-10-CM | POA: Diagnosis present

## 2023-01-29 DIAGNOSIS — N920 Excessive and frequent menstruation with regular cycle: Secondary | ICD-10-CM | POA: Diagnosis present

## 2023-01-29 LAB — CBC WITH DIFFERENTIAL (CANCER CENTER ONLY)
Abs Immature Granulocytes: 0.01 10*3/uL (ref 0.00–0.07)
Basophils Absolute: 0 10*3/uL (ref 0.0–0.1)
Basophils Relative: 0 %
Eosinophils Absolute: 0.2 10*3/uL (ref 0.0–0.5)
Eosinophils Relative: 3 %
HCT: 38.1 % (ref 36.0–46.0)
Hemoglobin: 12.3 g/dL (ref 12.0–15.0)
Immature Granulocytes: 0 %
Lymphocytes Relative: 18 %
Lymphs Abs: 1.3 10*3/uL (ref 0.7–4.0)
MCH: 28.1 pg (ref 26.0–34.0)
MCHC: 32.3 g/dL (ref 30.0–36.0)
MCV: 87.2 fL (ref 80.0–100.0)
Monocytes Absolute: 0.5 10*3/uL (ref 0.1–1.0)
Monocytes Relative: 7 %
Neutro Abs: 5.1 10*3/uL (ref 1.7–7.7)
Neutrophils Relative %: 72 %
Platelet Count: 287 10*3/uL (ref 150–400)
RBC: 4.37 MIL/uL (ref 3.87–5.11)
RDW: 15.6 % — ABNORMAL HIGH (ref 11.5–15.5)
WBC Count: 7.1 10*3/uL (ref 4.0–10.5)
nRBC: 0 % (ref 0.0–0.2)

## 2023-01-29 LAB — CMP (CANCER CENTER ONLY)
ALT: 7 U/L (ref 0–44)
AST: 13 U/L — ABNORMAL LOW (ref 15–41)
Albumin: 3.8 g/dL (ref 3.5–5.0)
Alkaline Phosphatase: 87 U/L (ref 38–126)
Anion gap: 7 (ref 5–15)
BUN: 10 mg/dL (ref 6–20)
CO2: 22 mmol/L (ref 22–32)
Calcium: 9.6 mg/dL (ref 8.9–10.3)
Chloride: 108 mmol/L (ref 98–111)
Creatinine: 0.85 mg/dL (ref 0.44–1.00)
GFR, Estimated: >60 mL/min (ref >60)
Glucose, Bld: 95 mg/dL (ref 70–99)
Potassium: 3.7 mmol/L (ref 3.5–5.1)
Sodium: 137 mmol/L (ref 135–145)
Total Bilirubin: 0.2 mg/dL — ABNORMAL LOW (ref 0.3–1.2)
Total Protein: 7.9 g/dL (ref 6.5–8.1)

## 2023-01-29 LAB — RETIC PANEL
Immature Retic Fract: 6.6 % (ref 2.3–15.9)
RBC.: 4.3 MIL/uL (ref 3.87–5.11)
Retic Count, Absolute: 38.7 10*3/uL (ref 19.0–186.0)
Retic Ct Pct: 0.9 % (ref 0.4–3.1)
Reticulocyte Hemoglobin: 33 pg (ref >27.9)

## 2023-01-29 LAB — IRON AND IRON BINDING CAPACITY (CC-WL,HP ONLY)
Iron: 65 ug/dL (ref 28–170)
Saturation Ratios: 25 % (ref 10.4–31.8)
TIBC: 259 ug/dL (ref 250–450)
UIBC: 194 ug/dL (ref 148–442)

## 2023-01-29 NOTE — Telephone Encounter (Signed)
Patient called to r/s missed 4/12 appointment. Patient rescheduled and notified.

## 2023-01-30 LAB — FERRITIN: Ferritin: 143 ng/mL (ref 11–307)

## 2023-02-01 ENCOUNTER — Encounter: Payer: Self-pay | Admitting: Family Medicine

## 2023-02-01 ENCOUNTER — Ambulatory Visit (INDEPENDENT_AMBULATORY_CARE_PROVIDER_SITE_OTHER): Payer: BC Managed Care – PPO | Admitting: Family Medicine

## 2023-02-01 VITALS — BP 128/80 | HR 86 | Temp 98.9°F | Resp 16 | Ht 64.0 in | Wt 285.4 lb

## 2023-02-01 DIAGNOSIS — F32A Depression, unspecified: Secondary | ICD-10-CM | POA: Diagnosis not present

## 2023-02-01 DIAGNOSIS — S91311A Laceration without foreign body, right foot, initial encounter: Secondary | ICD-10-CM | POA: Diagnosis not present

## 2023-02-01 DIAGNOSIS — F419 Anxiety disorder, unspecified: Secondary | ICD-10-CM | POA: Diagnosis not present

## 2023-02-01 MED ORDER — BUSPIRONE HCL 15 MG PO TABS: 15.0000 mg | ORAL_TABLET | Freq: Two times a day (BID) | ORAL | 3 refills | Status: DC

## 2023-02-01 NOTE — Progress Notes (Signed)
   Subjective:    Patient ID: Krystal Harris, female    DOB: 13-Sep-1986, 37 y.o.   MRN: [id number]  HPI ER f/u- pt stepped on broken glass on 4/11.  She had a small laceration on the plantar surface of R foot.  No sutures needed.  Tdap updated.  Now has a scab.  Still has some aching w/ weight bearing.  Anxiety/Depression- pt reports she cannot afford $30/month for Pristiq.  Pt is also on Wellbutrin XL  daily and Buspar 7.5mg  BID.  Pt reports she is not as depressed, she is more anxious.     Review of Systems For ROS see HPI     Objective:   Physical Exam Vitals reviewed.  Constitutional:      General: She is not in acute distress.    Appearance: Normal appearance. She is obese. She is not ill-appearing.  HENT:     Head: Normocephalic and atraumatic.  Skin:    General: Skin is warm and dry.     Findings: Lesion (well healing superficial laceration on plantar surface of R foot) present.  Neurological:     General: No focal deficit present.     Mental Status: She is alert and oriented to person, place, and time.  Psychiatric:        Mood and Affect: Mood normal.        Behavior: Behavior normal.        Thought Content: Thought content normal.           Assessment & Plan:   Laceration R foot- improving.  Area is healing well.  No evidence of infxn or foreign body.  Tdap updated in ER.

## 2023-02-01 NOTE — Patient Instructions (Signed)
Follow up in 4-6 weeks to recheck mood- can be virtual INCREASE the Buspar to  twice daily- 2 of what you have at home and 1 of the new prescription Your foot looks great!  No changes at this time Call with any questions or concerns Hang in there!!!

## 2023-02-01 NOTE — Assessment & Plan Note (Signed)
Pt can no longer afford Pristiq now that it is $30/month.  Since she feels her mood is now more anxiety than depression, will increase Buspar to  BID and continue Wellbutrin as is.  Pt expressed understanding and is in agreement w/ plan.

## 2023-02-05 ENCOUNTER — Telehealth: Payer: Self-pay | Admitting: Family Medicine

## 2023-02-05 NOTE — Telephone Encounter (Signed)
Yes, pt needs appt to evaluate.  In the meantime, should take OTC allergy medication

## 2023-02-05 NOTE — Telephone Encounter (Signed)
Office appt advised

## 2023-02-05 NOTE — Telephone Encounter (Signed)
Caller name: UNITA DETAMORE  On DPR?: Yes  Call back number: [phone] (home)  Provider they see: Sheliah Hatch, MD  Reason for call: Pt states both ears make her feel off balance. Vision blurred and then cleared up quickly.

## 2023-02-07 ENCOUNTER — Encounter: Payer: Self-pay | Admitting: Family Medicine

## 2023-02-07 ENCOUNTER — Ambulatory Visit: Payer: BC Managed Care – PPO | Admitting: Family Medicine

## 2023-02-07 VITALS — BP 130/80 | HR 99 | Temp 97.7°F | Resp 18 | Ht 64.0 in | Wt 284.1 lb

## 2023-02-07 DIAGNOSIS — R42 Dizziness and giddiness: Secondary | ICD-10-CM

## 2023-02-07 DIAGNOSIS — T887XXA Unspecified adverse effect of drug or medicament, initial encounter: Secondary | ICD-10-CM | POA: Diagnosis not present

## 2023-02-07 NOTE — Patient Instructions (Signed)
Send me a MyChart message on Monday and let me know how things are going DECREASE the Buspar back to 1/2 tab (7.5mg ) twice daily Drink LOTS of fluids throughout the day Call with any questions or concerns Hang in there!!!

## 2023-02-07 NOTE — Progress Notes (Signed)
   Subjective:    Patient ID: Krystal Harris, female    DOB: May 11, 1986, 37 y.o.   MRN: [id number]  HPI Dizziness- pt reports that dizziness is occurring more frequently since increased dose of Buspar.  She states she can feel it in her ears- it feels like a vibration and like being underwater.  Sxs started Sunday when she started the  dose.  No N/V.     Review of Systems For ROS see HPI     Objective:   Physical Exam Vitals reviewed.  Constitutional:      General: She is not in acute distress.    Appearance: Normal appearance. She is well-developed. She is obese. She is not ill-appearing.  HENT:     Head: Normocephalic and atraumatic.     Right Ear: Tympanic membrane and ear canal normal.     Left Ear: Tympanic membrane and ear canal normal.     Mouth/Throat:     Pharynx: Uvula midline.  Eyes:     Extraocular Movements: Extraocular movements intact.     Conjunctiva/sclera: Conjunctivae normal.     Pupils: Pupils are equal, round, and reactive to light.  Cardiovascular:     Rate and Rhythm: Normal rate and regular rhythm.     Heart sounds: Normal heart sounds.  Pulmonary:     Effort: Pulmonary effort is normal. No respiratory distress.     Breath sounds: Normal breath sounds. No wheezing or rales.  Musculoskeletal:     Cervical back: Normal range of motion and neck supple.  Lymphadenopathy:     Cervical: No cervical adenopathy.  Skin:    General: Skin is warm and dry.  Neurological:     Mental Status: She is alert and oriented to person, place, and time.     Cranial Nerves: No cranial nerve deficit.     Deep Tendon Reflexes: Reflexes are normal and symmetric.  Psychiatric:        Behavior: Behavior normal.        Thought Content: Thought content normal.        Judgment: Judgment normal.           Assessment & Plan:  Dizziness/Medication side effect- new.  Pt reports that since starting increased dose of Buspar on Sunday she has had fullness in her ears,  'like being underwater', dizziness, and feeling like her ears were 'vibrating'.  She did not take the 2nd dose yesterday and things were better.  Will decrease Buspar back to 7.5mg  daily as she was able to tolerate that dose w/o side effects.  Pt expressed understanding and is in agreement w/ plan.

## 2023-02-14 ENCOUNTER — Encounter: Payer: Self-pay | Admitting: Family Medicine

## 2023-02-14 NOTE — Telephone Encounter (Signed)
Pt saw you on 02/07/23 how long should patient give medication to work?

## 2023-02-15 ENCOUNTER — Other Ambulatory Visit: Payer: Self-pay | Admitting: Family Medicine

## 2023-02-15 MED ORDER — VENLAFAXINE HCL ER 37.5 MG PO CP24: 37.5000 mg | ORAL_CAPSULE | Freq: Every day | ORAL | 3 refills | Status: DC

## 2023-02-15 NOTE — Progress Notes (Signed)
Refill on Venlafaxine sent to pharmacy

## 2023-02-20 ENCOUNTER — Other Ambulatory Visit: Payer: Self-pay | Admitting: Family Medicine

## 2023-02-20 ENCOUNTER — Other Ambulatory Visit: Payer: Self-pay

## 2023-02-20 DIAGNOSIS — G4700 Insomnia, unspecified: Secondary | ICD-10-CM

## 2023-02-20 MED ORDER — TRAZODONE HCL 100 MG PO TABS
100.0000 mg | ORAL_TABLET | Freq: Every evening | ORAL | 1 refills | Status: DC
Start: 2023-02-20 — End: 2023-04-23

## 2023-03-05 ENCOUNTER — Encounter: Payer: Self-pay | Admitting: Family Medicine

## 2023-03-05 ENCOUNTER — Telehealth (INDEPENDENT_AMBULATORY_CARE_PROVIDER_SITE_OTHER): Payer: BC Managed Care – PPO | Admitting: Family Medicine

## 2023-03-05 VITALS — Ht 64.0 in | Wt 280.0 lb

## 2023-03-05 DIAGNOSIS — F32A Depression, unspecified: Secondary | ICD-10-CM

## 2023-03-05 DIAGNOSIS — F419 Anxiety disorder, unspecified: Secondary | ICD-10-CM | POA: Diagnosis not present

## 2023-03-05 MED ORDER — VENLAFAXINE HCL ER 75 MG PO CP24
75.0000 mg | ORAL_CAPSULE | Freq: Every day | ORAL | 3 refills | Status: DC
Start: 2023-03-05 — End: 2023-09-12

## 2023-03-05 NOTE — Progress Notes (Signed)
Virtual Visit via Video   I connected with patient on 03/05/23 at 12:40 PM EDT by a video enabled telemedicine application and verified that I am speaking with the correct person using two identifiers.  Location patient: Home Location provider: [place] [facility] Persons participating in the virtual visit: Patient, Provider, CMA Sheryle Hail C)  I discussed the limitations of evaluation and management by telemedicine and the availability of in person appointments. The patient expressed understanding and agreed to proceed.  Subjective:   HPI:   Anxiety/Depression- at last visit we had to decrease Buspar back to 7.5mg  BID as she was dizzy on the 15mg .  Also on Wellbutrin 150mg  daily and Venlafaxine 37.5mg  daily (was just started 5/2)  Last week had a very hard week b/c her apartment complex lost water and she had PTSD back to [facility].  Had to use the Alprazolam more often.  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    Pt does note a change/improvement since starting Venlafaxine.    ROS:   See pertinent positives and negatives per HPI.  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 tube) procedure 06/01/2020   GERD (gastroesophageal reflux disease) 05/01/2019   Knee osteoarthritis 09/12/2017   Morbid obesity (HCC) 08/13/2017   Anxiety and depression 04/11/2017   Insomnia 04/11/2017   Fatty liver 09/27/2016   Physical exam 02/21/2013    Social History   Tobacco Use   Smoking status: Never   Smokeless tobacco: Never  Substance Use Topics   Alcohol use: Yes    Comment: occasionally    Current Outpatient Medications:    ALPRAZolam (XANAX) 0.5 MG tablet, TAKE 1 TABLET BY MOUTH 2 TIMES DAILY AS NEEDED FOR ANXIETY., Disp: 30 tablet, Rfl: 0    buPROPion (WELLBUTRIN XL) 150 MG 24 hr tablet, TAKE 1 TABLET BY MOUTH EVERY DAY, Disp: 30 tablet, Rfl: 3   busPIRone (BUSPAR) 15 MG tablet, Take 1 tablet (15 mg total) by mouth 2 (two) times daily., Disp: 60 tablet, Rfl: 3   Cholecalciferol (VITAMIN D3 PO), Take 3,000 Units by mouth daily at 12 noon. , Disp: , Rfl:    ibuprofen (ADVIL) 800 MG tablet, Take 1 tablet (800 mg total) by mouth every 8 (eight) hours as needed for moderate pain, mild pain or cramping., Disp: 30 tablet, Rfl: 7   traZODone (DESYREL) 100 MG tablet, Take 1 tablet (100 mg total) by mouth at bedtime., Disp: 30 tablet, Rfl: 1   venlafaxine XR (EFFEXOR XR) 37.5 MG 24 hr capsule, Take 1 capsule (37.5 mg total) by mouth daily with breakfast., Disp: 30 capsule, Rfl: 3   Pediatric Multiple Vitamins (FLINSTONES GUMMIES OMEGA-3 DHA PO), Take by mouth. (Patient not taking: Reported on 03/05/2023), Disp: , Rfl:   Allergies  Allergen Reactions   Penicillins Hives, Nausea And Vomiting and Rash    Has patient had a PCN reaction causing immediate rash, facial/tongue/throat swelling, SOB or lightheadedness with hypotension: no Has patient had a PCN reaction causing severe rash involving mucus membranes or skin necrosis: unknown Has patient had a PCN reaction that required hospitalization unknown Has patient had a PCN reaction occurring within the last 10 years: unknown If all of the above answers are "NO", then may proceed with Cephalosporin use.  Has patient had a PCN reaction causing  immediate rash, facial/tongue/throat swelling, SOB or lightheadedness with hypotension: no Has patient had a PCN reaction causing severe rash involving mucus membranes or skin necrosis: unknown Has patient had a PCN reaction that required hospitalization unknown Has patient had a PCN reaction occurring within the last 10 years: unknown If all of the above answers are "NO", then may proceed with Cephalosporin use. Has patient had a PCN reaction causing  immediat    Objective:   Ht 5\' 4"  (1.626 m)   Wt 280 lb (127 kg)   BMI 48.06 kg/m  AAOx3, NAD NCAT, EOMI No obvious CN deficits Coloring WNL Pt is able to speak clearly, coherently without shortness of breath or increased work of breathing.  Thought process is linear.  Mood is appropriate.   Assessment and Plan:   Anxiety/depression- ongoing issue.  Some improvement w/ addition of Venlafaxine 37.5mg  daily.  Given her ongoing stressors, will increase to 75mg  daily.  Pt expressed understanding and is in agreement w/ plan.    Neena Rhymes, MD 03/05/2023

## 2023-03-16 ENCOUNTER — Inpatient Hospital Stay: Payer: BC Managed Care – PPO | Attending: Hematology and Oncology

## 2023-03-16 ENCOUNTER — Inpatient Hospital Stay: Payer: BC Managed Care – PPO | Admitting: Hematology and Oncology

## 2023-03-16 ENCOUNTER — Other Ambulatory Visit: Payer: Self-pay | Admitting: Hematology and Oncology

## 2023-03-16 DIAGNOSIS — D500 Iron deficiency anemia secondary to blood loss (chronic): Secondary | ICD-10-CM

## 2023-03-16 NOTE — Progress Notes (Unsigned)
No show

## 2023-03-19 ENCOUNTER — Encounter: Payer: Self-pay | Admitting: Hematology and Oncology

## 2023-04-22 ENCOUNTER — Other Ambulatory Visit: Payer: Self-pay | Admitting: Family Medicine

## 2023-04-22 DIAGNOSIS — G4700 Insomnia, unspecified: Secondary | ICD-10-CM

## 2023-05-04 ENCOUNTER — Inpatient Hospital Stay: Payer: BC Managed Care – PPO | Admitting: Hematology and Oncology

## 2023-05-04 ENCOUNTER — Telehealth: Payer: Self-pay | Admitting: Hematology and Oncology

## 2023-05-04 ENCOUNTER — Telehealth: Payer: Self-pay

## 2023-05-04 ENCOUNTER — Inpatient Hospital Stay: Payer: BC Managed Care – PPO

## 2023-05-04 NOTE — Telephone Encounter (Signed)
TC to pt to notify of need to cancel and reschedule today's appointment w/ Dr. Leonides Schanz d/t worldwide cyber issue taking place, impacting [facility]'s computer system. Pt verbalizes understanding--scheduling to call pt to reschedule.

## 2023-05-15 ENCOUNTER — Telehealth: Payer: Self-pay | Admitting: Physician Assistant

## 2023-05-15 ENCOUNTER — Inpatient Hospital Stay: Payer: BC Managed Care – PPO

## 2023-05-15 ENCOUNTER — Inpatient Hospital Stay: Payer: BC Managed Care – PPO | Admitting: Hematology and Oncology

## 2023-05-15 NOTE — Progress Notes (Unsigned)
Rescheduled

## 2023-05-16 ENCOUNTER — Other Ambulatory Visit: Payer: Self-pay

## 2023-05-16 ENCOUNTER — Inpatient Hospital Stay: Payer: BC Managed Care – PPO | Admitting: Physician Assistant

## 2023-05-16 ENCOUNTER — Inpatient Hospital Stay: Payer: BC Managed Care – PPO | Attending: Hematology and Oncology

## 2023-05-16 ENCOUNTER — Inpatient Hospital Stay (HOSPITAL_BASED_OUTPATIENT_CLINIC_OR_DEPARTMENT_OTHER): Payer: BC Managed Care – PPO | Admitting: Physician Assistant

## 2023-05-16 VITALS — BP 129/79 | HR 100 | Temp 100.4°F | Resp 20 | Wt 293.5 lb

## 2023-05-16 DIAGNOSIS — R000 Tachycardia, unspecified: Secondary | ICD-10-CM | POA: Insufficient documentation

## 2023-05-16 DIAGNOSIS — D500 Iron deficiency anemia secondary to blood loss (chronic): Secondary | ICD-10-CM | POA: Diagnosis present

## 2023-05-16 DIAGNOSIS — R002 Palpitations: Secondary | ICD-10-CM | POA: Diagnosis not present

## 2023-05-16 DIAGNOSIS — N920 Excessive and frequent menstruation with regular cycle: Secondary | ICD-10-CM | POA: Insufficient documentation

## 2023-05-16 LAB — IRON AND IRON BINDING CAPACITY (CC-WL,HP ONLY)
Iron: 51 ug/dL (ref 28–170)
Saturation Ratios: 17 % (ref 10.4–31.8)
TIBC: 293 ug/dL (ref 250–450)
UIBC: 242 ug/dL (ref 148–442)

## 2023-05-16 LAB — CMP (CANCER CENTER ONLY)
ALT: 7 U/L (ref 0–44)
AST: 13 U/L — ABNORMAL LOW (ref 15–41)
Albumin: 3.8 g/dL (ref 3.5–5.0)
Alkaline Phosphatase: 85 U/L (ref 38–126)
Anion gap: 6 (ref 5–15)
BUN: 7 mg/dL (ref 6–20)
CO2: 26 mmol/L (ref 22–32)
Calcium: 9.1 mg/dL (ref 8.9–10.3)
Chloride: 105 mmol/L (ref 98–111)
Creatinine: 0.76 mg/dL (ref 0.44–1.00)
GFR, Estimated: >60 mL/min (ref >60)
Glucose, Bld: 100 mg/dL — ABNORMAL HIGH (ref 70–99)
Potassium: 3.9 mmol/L (ref 3.5–5.1)
Sodium: 137 mmol/L (ref 135–145)
Total Bilirubin: 0.3 mg/dL (ref 0.3–1.2)
Total Protein: 7.7 g/dL (ref 6.5–8.1)

## 2023-05-16 LAB — RETIC PANEL
Immature Retic Fract: 11.7 % (ref 2.3–15.9)
RBC.: 4.12 MIL/uL (ref 3.87–5.11)
Retic Count, Absolute: 54 10*3/uL (ref 19.0–186.0)
Retic Ct Pct: 1.3 % (ref 0.4–3.1)
Reticulocyte Hemoglobin: 31.5 pg (ref >27.9)

## 2023-05-16 LAB — CBC WITH DIFFERENTIAL (CANCER CENTER ONLY)
Abs Immature Granulocytes: 0.02 10*3/uL (ref 0.00–0.07)
Basophils Absolute: 0 10*3/uL (ref 0.0–0.1)
Basophils Relative: 1 %
Eosinophils Absolute: 0.1 10*3/uL (ref 0.0–0.5)
Eosinophils Relative: 2 %
HCT: 37.6 % (ref 36.0–46.0)
Hemoglobin: 12.2 g/dL (ref 12.0–15.0)
Immature Granulocytes: 0 %
Lymphocytes Relative: 19 %
Lymphs Abs: 1.1 10*3/uL (ref 0.7–4.0)
MCH: 29.3 pg (ref 26.0–34.0)
MCHC: 32.4 g/dL (ref 30.0–36.0)
MCV: 90.4 fL (ref 80.0–100.0)
Monocytes Absolute: 0.4 10*3/uL (ref 0.1–1.0)
Monocytes Relative: 6 %
Neutro Abs: 4.2 10*3/uL (ref 1.7–7.7)
Neutrophils Relative %: 72 %
Platelet Count: 295 10*3/uL (ref 150–400)
RBC: 4.16 MIL/uL (ref 3.87–5.11)
RDW: 12.4 % (ref 11.5–15.5)
WBC Count: 5.8 10*3/uL (ref 4.0–10.5)
nRBC: 0 % (ref 0.0–0.2)

## 2023-05-16 LAB — FERRITIN: Ferritin: 50 ng/mL (ref 11–307)

## 2023-05-16 NOTE — Progress Notes (Signed)
[facility] [phone]   [phone]  PROGRESS NOTE  Patient Care Team: Sheliah Hatch, MD as PCP - General (Family Medicine) Maxie Better, MD as Consulting Physician (Obstetrics and Gynecology)  Hematological/Oncological History # Iron Deficiency Anemia 2/2 to GYN Bleeding 06/06/2022: WBC 6.8, Hgb 9.7, MCV 78, Plt 436, ferritin 5, iron sat 6% 11/16/2022: WBC 5.9, Hgb 11.2, MCV 84, Plt 373, Iron sat 8%, ferritin 8 12/11/2022: establish care with Dr. Leonides Schanz  12/25/2022-01/01/2023: Received IV feraheme 510 mg x 2 doses  CHIEF COMPLAINTS/PURPOSE OF CONSULTATION:  " Iron Deficiency Anemia 2/2 to GYN Bleeding "  HISTORY OF PRESENTING ILLNESS:  Krystal Harris 37 y.o. female returns for a follow up for iron deficiency anemia.  Patient was last seen on 12/11/2022 to establish care. In the interim, she received IV feraheme 510 mg x 2 doses.   Krystal Harris reports that her energy levels improved with the IV iron. She adds that her menstrual cycles have improved with better flow. She continues to have each cycle lasting 5 days with 1-2 days of heavy bleeding. She denies nausea, vomiting or bowel habit changes. She otherwise denies any fevers, chills, sweats, shortness of breath, chest pain, cough, headaches or dizziness.  A full 10 point ROS was otherwise negative.  MEDICAL HISTORY:  Past Medical History:  Diagnosis Date   Anemia    Anxiety    Chicken pox    Depression    Gallbladder problem    GERD (gastroesophageal reflux disease)    PONV (postoperative nausea and vomiting)    vomiting after surgery gallbladder removal 2016   Vitamin D deficiency     SURGICAL HISTORY: Past Surgical History:  Procedure Laterality Date   BRAVO PH STUDY N/A 01/22/2020   Procedure: BRAVO PH STUDY;  Surgeon: Rachael Fee, MD;  Location: [facility];  Service: Endoscopy;  Laterality: N/A;   CHOLECYSTECTOMY     laprascopic   CYST EXCISION Right 12/29/2016    Procedure: EXCISION OF RIGHT AXILLARY CYST;  Surgeon: Axel Filler, MD;  Location: [facility];  Service: General;  Laterality: Right;   DILATATION & CURETTAGE/HYSTEROSCOPY WITH MYOSURE N/A 05/05/2022   Procedure: DILATATION & CURETTAGE/HYSTEROSCOPY WITH MYOSURE;  Surgeon: Maxie Better, MD;  Location: [facility];  Service: Gynecology;  Laterality: N/A;   ESOPHAGOGASTRODUODENOSCOPY N/A 08/04/2020   Procedure: ESOPHAGOGASTRODUODENOSCOPY (EGD) with balloon dilation;  Surgeon: Axel Filler, MD;  Location: [facility];  Service: General;  Laterality: N/A;   ESOPHAGOGASTRODUODENOSCOPY (EGD) WITH PROPOFOL N/A 01/22/2020   Procedure: ESOPHAGOGASTRODUODENOSCOPY (EGD) WITH PROPOFOL;  Surgeon: Rachael Fee, MD;  Location: [facility];  Service: Endoscopy;  Laterality: N/A;   FOOT SURGERY  02/2015   scar tissue and keloid scar removal   FOREIGN BODY REMOVAL Right 09/17/2013   Procedure: REMOVAL FOREIGN BODY NEEDLE PLANTAR ASPECT OF THE FOOT;  Surgeon: Nadara Mustard, MD;  Location: [facility];  Service: Orthopedics;  Laterality: Right;  Remove Foreign Body Right Foot   LAPAROSCOPIC NISSEN FUNDOPLICATION  06/01/2020   WISDOM TOOTH EXTRACTION      SOCIAL HISTORY: Social History   Socioeconomic History   Marital status: Single    Spouse name: Not on file   Number of children: Not on file   Years of education: Not on file   Highest education level: Not on file  Occupational History   Not on file  Tobacco Use   Smoking status: Never   Smokeless tobacco: Never  Vaping Use   Vaping status:  Never Used  Substance and Sexual Activity   Alcohol use: Yes    Comment: occasionally   Drug use: No   Sexual activity: Yes    Birth control/protection: Pill  Other Topics Concern   Not on file  Social History Narrative   Not on file   Social Determinants of Health   Financial Resource Strain: Not on file  Food Insecurity: Not on file  Transportation Needs: Not on file  Physical Activity:  Not on file  Stress: Not on file  Social Connections: Not on file  Intimate Partner Violence: Not on file    FAMILY HISTORY: Family History  Problem Relation Age of Onset   Diabetes Mother    Hypertension Mother    Thyroid disease Mother    Obesity Mother    Stroke Maternal Grandmother    Diabetes Maternal Grandfather    Stroke Maternal Grandfather    Hypertension Maternal Grandfather    Lung cancer Maternal Grandfather    Stomach cancer Neg Hx    Colon cancer Neg Hx    Rectal cancer Neg Hx    Pancreatic cancer Neg Hx     ALLERGIES:  is allergic to penicillins.  MEDICATIONS:  Current Outpatient Medications  Medication Sig Dispense Refill   ALPRAZolam (XANAX) 0.5 MG tablet TAKE 1 TABLET BY MOUTH 2 TIMES DAILY AS NEEDED FOR ANXIETY. 30 tablet 0   buPROPion (WELLBUTRIN XL) 150 MG 24 hr tablet TAKE 1 TABLET BY MOUTH EVERY DAY 30 tablet 3   busPIRone (BUSPAR) 15 MG tablet Take 1 tablet (15 mg total) by mouth 2 (two) times daily. 60 tablet 3   Cholecalciferol (VITAMIN D3 PO) Take 3,000 Units by mouth daily at 12 noon.      ibuprofen (ADVIL) 800 MG tablet Take 1 tablet (800 mg total) by mouth every 8 (eight) hours as needed for moderate pain, mild pain or cramping. 30 tablet 7   Pediatric Multiple Vitamins (FLINSTONES GUMMIES OMEGA-3 DHA PO) Take by mouth.     traZODone (DESYREL) 100 MG tablet TAKE 1 TABLET BY MOUTH EVERYDAY AT BEDTIME 30 tablet 1   venlafaxine XR (EFFEXOR XR) 75 MG 24 hr capsule Take 1 capsule (75 mg total) by mouth daily with breakfast. (Patient taking differently: Take 150 mg by mouth daily with breakfast.) 30 capsule 3   No current facility-administered medications for this visit.    REVIEW OF SYSTEMS:   Constitutional: ( - ) fevers, ( - )  chills , ( - ) night sweats Eyes: ( - ) blurriness of vision, ( - ) double vision, ( - ) watery eyes Ears, nose, mouth, throat, and face: ( - ) mucositis, ( - ) sore throat Respiratory: ( - ) cough, ( - ) dyspnea, ( - )  wheezes Cardiovascular: ( - ) palpitation, ( - ) chest discomfort, ( - ) lower extremity swelling Gastrointestinal:  ( - ) nausea, ( - ) heartburn, ( - ) change in bowel habits Skin: ( - ) abnormal skin rashes Lymphatics: ( - ) new lymphadenopathy, ( - ) easy bruising Neurological: ( - ) numbness, ( - ) tingling, ( - ) new weaknesses Behavioral/Psych: ( - ) mood change, ( - ) new changes  All other systems were reviewed with the patient and are negative.  PHYSICAL EXAMINATION:  Vitals:   05/16/23 1350  BP: 129/79  Pulse: 100  Resp: 20  Temp: (!) 100.4 F (38 C)  SpO2: 100%   Filed Weights   05/16/23 1350  Weight: 293 lb 8 oz (133.1 kg)    GENERAL: well appearing middle-aged African-American female in NAD  SKIN: skin color, texture, turgor are normal, no rashes or significant lesions EYES: conjunctiva are pink and non-injected, sclera clear LUNGS: clear to auscultation and percussion with normal breathing effort HEART: tachycardic with regular rhythm and no murmurs and no lower extremity edema Musculoskeletal: no cyanosis of digits and no clubbing  PSYCH: alert & oriented x 3, fluent speech NEURO: no focal motor/sensory deficits  LABORATORY DATA:  I have reviewed the data as listed    Latest Ref Rng & Units 05/16/2023    8:58 AM 01/29/2023    4:02 PM 12/11/2022   10:02 AM  CBC  WBC 4.0 - 10.5 K/uL 5.8  7.1  5.8   Hemoglobin 12.0 - 15.0 g/dL 82.9  56.2  13.0   Hematocrit 36.0 - 46.0 % 37.6  38.1  35.2   Platelets 150 - 400 K/uL 295  287  364        Latest Ref Rng & Units 05/16/2023    8:58 AM 01/29/2023    4:02 PM 12/11/2022   10:02 AM  CMP  Glucose 70 - 99 mg/dL 865  95  84   BUN 6 - 20 mg/dL 7  10  11    Creatinine 0.44 - 1.00 mg/dL 7.84  6.96  2.95   Sodium 135 - 145 mmol/L 137  137  137   Potassium 3.5 - 5.1 mmol/L 3.9  3.7  4.2   Chloride 98 - 111 mmol/L 105  108  106   CO2 22 - 32 mmol/L 26  22  27    Calcium 8.9 - 10.3 mg/dL 9.1  9.6  8.8   Total Protein 6.5  - 8.1 g/dL 7.7  7.9  7.9   Total Bilirubin 0.3 - 1.2 mg/dL 0.3  0.2  0.3   Alkaline Phos 38 - 126 U/L 85  87  95   AST 15 - 41 U/L 13  13  12    ALT 0 - 44 U/L 7  7  7       ASSESSMENT & PLAN Krystal Harris returns for a follow up for iron deficiency anemia.   # Iron Deficiency Anemia 2/2 to GYN Bleeding -- Findings are consistent with iron deficiency anemia secondary to patient's menorrhagia --Encouraged her to follow-up with OB/GYN for better control of her menstrual cycles --unable to tolerate ferrous sulfate 325 mg due to GI upset. Taking Flintstone vitamin + iron.  --Received IV feraheme 510 mg x 2 doses from 12/25/2022-01/01/2023.  --Labs today show evidence of anemia with Hgb 12.2, MCV 90.4. Iron panel shows ferritin 50, iron 51, TIBC 293, saturation 17%. --No need for additional IV iron at this time --RTC in 3 months for labs only and 6 months for labs/follow up.   #Tachycardia/palpitations: --HR today was 100 bpm.  --Encouraged patient to limit caffeine and follow up with PCP for further evaluation.   No orders of the defined types were placed in this encounter.   All questions were answered. The patient knows to call the clinic with any problems, questions or concerns.  I have spent a total of 25 minutes minutes of face-to-face and non-face-to-face time, preparing to see the patient, performing a medically appropriate examination, counseling and educating the patient, documenting clinical information in the electronic health record, and care coordination.   Georga Kaufmann PA-C Dept of Hematology and Oncology [facility] Phone:  [phone]   05/16/2023 4:57 PM

## 2023-05-17 ENCOUNTER — Ambulatory Visit: Payer: BC Managed Care – PPO | Admitting: Family Medicine

## 2023-05-17 VITALS — BP 130/82 | HR 102 | Temp 97.8°F | Resp 17 | Ht 64.0 in | Wt 290.1 lb

## 2023-05-17 DIAGNOSIS — R000 Tachycardia, unspecified: Secondary | ICD-10-CM | POA: Diagnosis not present

## 2023-05-17 NOTE — Patient Instructions (Signed)
Schedule your complete physical in 3 months Your heart rate is normal today I suspect the elevated heart rate was being nervous Call with any questions or concerns [employer]UCK STARTING [employer]OL!!!

## 2023-05-17 NOTE — Progress Notes (Signed)
   Subjective:    Patient ID: Krystal Harris, female    DOB: Nov 07, 1985, 37 y.o.   MRN: [id number]  HPI Tachycardia- pt had OV w/ hematology yesterday and HR was 100.  PA noted that HR did not decrease after taking a few deep breaths.  Pt has noted on 2 occasions over the summer that heart was beating fast but did not think much of it.  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  Does not drink energy drinks.  Occasional caffeine.  Was very anxious yesterday after they mentioned her HR.   Review of Systems For ROS see HPI     Objective:   Physical Exam Vitals reviewed.  Constitutional:      General: She is not in acute distress.    Appearance: Normal appearance. She is obese. She is not ill-appearing.  HENT:     Head: Normocephalic and atraumatic.  Eyes:     Extraocular Movements: Extraocular movements intact.     Conjunctiva/sclera: Conjunctivae normal.  Cardiovascular:     Rate and Rhythm: Normal rate and regular rhythm.  Pulmonary:     Effort: Pulmonary effort is normal. No respiratory distress.     Breath sounds: No wheezing or rhonchi.  Musculoskeletal:     Cervical back: Neck supple.  Lymphadenopathy:     Cervical: No cervical adenopathy.  Neurological:     Mental Status: She is alert.           Assessment & Plan:  Tachycardia- new.  Pt's HR of 100 yesterday at Hematology is not concerning.  She is under considerable stress and her HR increases in response.  Once I provided reassurance HR decreased to mid 80s on exam.  She felt better after our discussion.  Will follow at future visits.

## 2023-06-23 ENCOUNTER — Other Ambulatory Visit: Payer: Self-pay | Admitting: Family Medicine

## 2023-06-23 DIAGNOSIS — G4700 Insomnia, unspecified: Secondary | ICD-10-CM

## 2023-07-02 ENCOUNTER — Ambulatory Visit: Payer: BC Managed Care – PPO | Admitting: Family Medicine

## 2023-07-12 ENCOUNTER — Telehealth (INDEPENDENT_AMBULATORY_CARE_PROVIDER_SITE_OTHER): Payer: BC Managed Care – PPO | Admitting: Family Medicine

## 2023-07-12 ENCOUNTER — Encounter: Payer: Self-pay | Admitting: Family Medicine

## 2023-07-12 DIAGNOSIS — F32A Depression, unspecified: Secondary | ICD-10-CM | POA: Diagnosis not present

## 2023-07-12 DIAGNOSIS — F419 Anxiety disorder, unspecified: Secondary | ICD-10-CM | POA: Diagnosis not present

## 2023-07-12 MED ORDER — BUPROPION HCL ER (XL) 300 MG PO TB24: 300.0000 mg | ORAL_TABLET | Freq: Every day | ORAL | 3 refills | Status: DC

## 2023-07-12 NOTE — Progress Notes (Signed)
Virtual Visit via Video   I connected with patient on 07/12/23 at 10:00 AM EDT by a video enabled telemedicine application and verified that I am speaking with the correct person using two identifiers.  Location patient: Home Location provider: [place] [facility], Office Persons participating in the virtual visit: Patient, Provider, CMA (Terrah A)  I discussed the limitations of evaluation and management by telemedicine and the availability of in person appointments. The patient expressed understanding and agreed to proceed.  Subjective:   HPI:   Mood- 'i have been struggling'.  Pt has been in therapy weekly x5 weeks.  Is having mood swings, not sleeping, increased irritability.  'there is a lot on my plate'.  Doesn't feel medication is working.  Currently on Wellbutrin 150mg , Buspar 15mg  BID, Venlafaxine 150mg .  Taking Xanax more regularly.  Feels that anxiety is triggering depression.  Is at a new school, in grad school in addition to working.  ROS:   See pertinent positives and negatives per HPI.  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 tube) procedure 06/01/2020   GERD (gastroesophageal reflux disease) 05/01/2019   Knee osteoarthritis 09/12/2017   Morbid obesity (HCC) 08/13/2017   Anxiety and depression 04/11/2017   Insomnia 04/11/2017   Fatty liver 09/27/2016   Physical exam 02/21/2013    Social History   Tobacco Use   Smoking status: Never   Smokeless tobacco: Never  Substance Use Topics   Alcohol use: Yes    Comment: occasionally    Current Outpatient Medications:    ALPRAZolam (XANAX) 0.5 MG tablet, TAKE 1 TABLET BY MOUTH 2 TIMES DAILY AS NEEDED FOR ANXIETY., Disp: 30 tablet, Rfl: 0   buPROPion (WELLBUTRIN XL) 150 MG 24 hr tablet, TAKE 1 TABLET BY MOUTH EVERY DAY, Disp:  30 tablet, Rfl: 3   busPIRone (BUSPAR) 15 MG tablet, Take 1 tablet (15 mg total) by mouth 2 (two) times daily., Disp: 60 tablet, Rfl: 3   Cholecalciferol (VITAMIN D3 PO), Take 3,000 Units by mouth daily at 12 noon. , Disp: , Rfl:    ibuprofen (ADVIL) 800 MG tablet, Take 1 tablet (800 mg total) by mouth every 8 (eight) hours as needed for moderate pain, mild pain or cramping., Disp: 30 tablet, Rfl: 7   Pediatric Multiple Vitamins (FLINSTONES GUMMIES OMEGA-3 DHA PO), Take by mouth., Disp: , Rfl:    traZODone (DESYREL) 100 MG tablet, TAKE 1 TABLET BY MOUTH EVERYDAY AT BEDTIME, Disp: 30 tablet, Rfl: 1   venlafaxine XR (EFFEXOR XR) 75 MG 24 hr capsule, Take 1 capsule (75 mg total) by mouth daily with breakfast. (Patient taking differently: Take 150 mg by mouth daily with breakfast.), Disp: 30 capsule, Rfl: 3  Allergies  Allergen Reactions   Penicillins Hives, Nausea And Vomiting and Rash    Has patient had a PCN reaction causing immediate rash, facial/tongue/throat swelling, SOB or lightheadedness with hypotension: no Has patient had a PCN reaction causing severe rash involving mucus membranes or skin necrosis: unknown Has patient had a PCN reaction that required hospitalization unknown Has patient had a PCN reaction occurring within the last 10 years: unknown If all of the above answers are "NO", then may proceed with Cephalosporin use.  Has patient had a PCN reaction causing immediate rash, facial/tongue/throat swelling, SOB or lightheadedness with hypotension: no Has patient had a PCN reaction  causing severe rash involving mucus membranes or skin necrosis: unknown Has patient had a PCN reaction that required hospitalization unknown Has patient had a PCN reaction occurring within the last 10 years: unknown If all of the above answers are "NO", then may proceed with Cephalosporin use. Has patient had a PCN reaction causing immediat    Objective:   LMP 07/11/2023   AAOx3, NAD NCAT, EOMI No  obvious CN deficits Coloring WNL Pt is able to speak clearly, coherently without shortness of breath or increased work of breathing.  Thought process is linear.  She is tearful  Assessment and Plan:   Anxiety/Depression- deteriorated.  Pt reports feeling more anxious than ever.  Feels that her anxiety is causing depression.  Already on Effexor 150mg  daily, Buspar 15mg  BID, and Wellbutrin 150mg  dailiy.  Will increase Wellbutrin to 300mg  daily in hopes of decreasing need for regular alprazolam use.  Pt expressed understanding and is in agreement w/ plan.    Neena Rhymes, MD 07/12/2023

## 2023-08-03 ENCOUNTER — Inpatient Hospital Stay: Payer: BC Managed Care – PPO | Attending: Hematology and Oncology

## 2023-08-03 DIAGNOSIS — D500 Iron deficiency anemia secondary to blood loss (chronic): Secondary | ICD-10-CM | POA: Insufficient documentation

## 2023-08-03 DIAGNOSIS — N920 Excessive and frequent menstruation with regular cycle: Secondary | ICD-10-CM | POA: Insufficient documentation

## 2023-08-03 LAB — CBC WITH DIFFERENTIAL (CANCER CENTER ONLY)
Abs Immature Granulocytes: 0.01 10*3/uL (ref 0.00–0.07)
Basophils Absolute: 0 10*3/uL (ref 0.0–0.1)
Basophils Relative: 1 %
Eosinophils Absolute: 0.2 10*3/uL (ref 0.0–0.5)
Eosinophils Relative: 4 %
HCT: 37.4 % (ref 36.0–46.0)
Hemoglobin: 11.9 g/dL — ABNORMAL LOW (ref 12.0–15.0)
Immature Granulocytes: 0 %
Lymphocytes Relative: 17 %
Lymphs Abs: 1 10*3/uL (ref 0.7–4.0)
MCH: 28.2 pg (ref 26.0–34.0)
MCHC: 31.8 g/dL (ref 30.0–36.0)
MCV: 88.6 fL (ref 80.0–100.0)
Monocytes Absolute: 0.5 10*3/uL (ref 0.1–1.0)
Monocytes Relative: 7 %
Neutro Abs: 4.4 10*3/uL (ref 1.7–7.7)
Neutrophils Relative %: 71 %
Platelet Count: 309 10*3/uL (ref 150–400)
RBC: 4.22 MIL/uL (ref 3.87–5.11)
RDW: 12.6 % (ref 11.5–15.5)
WBC Count: 6.2 10*3/uL (ref 4.0–10.5)
nRBC: 0 % (ref 0.0–0.2)

## 2023-08-03 LAB — FERRITIN: Ferritin: 34 ng/mL (ref 11–307)

## 2023-08-03 LAB — IRON AND IRON BINDING CAPACITY (CC-WL,HP ONLY)
Iron: 53 ug/dL (ref 28–170)
Saturation Ratios: 18 % (ref 10.4–31.8)
TIBC: 300 ug/dL (ref 250–450)
UIBC: 247 ug/dL (ref 148–442)

## 2023-08-06 ENCOUNTER — Telehealth: Payer: Self-pay

## 2023-08-06 ENCOUNTER — Other Ambulatory Visit: Payer: Self-pay | Admitting: Physician Assistant

## 2023-08-06 NOTE — Telephone Encounter (Signed)
Krystal Harris, patient will be scheduled as soon as possible.  Auth Submission: NO AUTH NEEDED Site of care: Site of care: [facility] Payer: BCBS Medication & CPT/J Code(s) submitted: Feraheme (ferumoxytol) [id number] Route of submission (phone, fax, portal):  Phone # Fax # Auth type: Buy/Bill PB Units/visits requested: 510mg  x 1 dose Reference number:  Approval from: 08/06/23 to 10/16/23

## 2023-08-06 NOTE — Telephone Encounter (Signed)
Pt advised of lab results and recommendations.  After ins approval [facility] will call her to set up an appt.  Pt agreed to this plan of care.

## 2023-08-06 NOTE — Telephone Encounter (Signed)
-----   Message from Briant Cedar sent at 08/06/2023  7:49 AM EDT ----- Please notify patient that her iron levels are low end of normal with very mild anemia.   Recommend IV iron x 1 dose to boost levels up. ----- Message ----- From: Interface, Lab In [place] Sent: 08/03/2023   8:02 AM EDT To: Briant Cedar, PA-C

## 2023-08-09 ENCOUNTER — Ambulatory Visit: Payer: BC Managed Care – PPO

## 2023-08-09 VITALS — BP 123/83 | HR 84 | Temp 98.7°F | Resp 16 | Ht 64.5 in | Wt 289.6 lb

## 2023-08-09 DIAGNOSIS — D509 Iron deficiency anemia, unspecified: Secondary | ICD-10-CM | POA: Diagnosis not present

## 2023-08-09 MED ORDER — ACETAMINOPHEN 325 MG PO TABS: 650.0000 mg | ORAL_TABLET | Freq: Once | ORAL | Status: DC

## 2023-08-09 MED ORDER — SODIUM CHLORIDE 0.9 % IV SOLN
510.0000 mg | Freq: Once | INTRAVENOUS | Status: AC
  Administered 2023-08-09: 510 mg via INTRAVENOUS
  Filled 2023-08-09: qty 17

## 2023-08-09 MED ORDER — DIPHENHYDRAMINE HCL 25 MG PO CAPS: 25.0000 mg | ORAL_CAPSULE | Freq: Once | ORAL | Status: DC

## 2023-08-09 NOTE — Progress Notes (Signed)
Diagnosis: Iron Deficiency Anemia  Provider:  Chilton Greathouse MD  Procedure: IV Infusion  IV Type: Peripheral, IV Location: R Antecubital  Feraheme (Ferumoxytol), Dose: 510 mg  Infusion Start Time: 0906  Infusion Stop Time: 0922  Post Infusion IV Care: Observation period completed and Peripheral IV Discontinued  Discharge: Condition: Good, Destination: Home . AVS Provided  Performed by:  Loney Hering, LPN

## 2023-08-09 NOTE — Progress Notes (Deleted)
Diagnosis: Iron Deficiency Anemia  Provider:  Chilton Greathouse MD  Procedure: IV Infusion  IV Type: Peripheral, IV Location: R Forearm  Feraheme (Ferumoxytol), Dose: 510 mg  {Infusion Start Time:25399}  {Infusion Stop Time:25400}  Post Infusion IV Care: {[facility] Post Infusion:25398}  Discharge: Condition: Good, Destination: Home . AVS Declined  Performed by:  Garnette Czech, RN

## 2023-08-16 ENCOUNTER — Ambulatory Visit: Payer: BC Managed Care – PPO

## 2023-08-27 ENCOUNTER — Ambulatory Visit: Payer: BC Managed Care – PPO | Admitting: Family Medicine

## 2023-08-27 ENCOUNTER — Encounter: Payer: Self-pay | Admitting: Family Medicine

## 2023-08-27 VITALS — BP 136/88 | HR 96 | Temp 97.9°F | Ht 64.5 in | Wt 293.0 lb

## 2023-08-27 DIAGNOSIS — E559 Vitamin D deficiency, unspecified: Secondary | ICD-10-CM

## 2023-08-27 DIAGNOSIS — Z0000 Encounter for general adult medical examination without abnormal findings: Secondary | ICD-10-CM

## 2023-08-27 NOTE — Patient Instructions (Signed)
Follow up in 6 months to recheck weight loss progress We'll notify you of your lab results and make any changes if needed Continue to work on healthy diet and regular exercise- you can do it! Call with any questions or concerns Stay Safe!  Stay Healthy! Hang in there!!!

## 2023-08-27 NOTE — Progress Notes (Signed)
   Subjective:    Patient ID: Krystal Harris, female    DOB: 1985/11/29, 37 y.o.   MRN: [id number]  HPI CPE- UTD on pap, Tdap, flu  Patient Care Team    Relationship Specialty Notifications Start End  Sheliah Hatch, MD PCP - General Family Medicine  02/12/13   Maxie Better, MD Consulting Physician Obstetrics and Gynecology  03/22/18     Health Maintenance  Topic Date Due   Cervical Cancer Screening (HPV/Pap Cotest)  05/24/2027   DTaP/Tdap/Td (3 - Td or Tdap) 01/24/2033   INFLUENZA VACCINE  Completed   HPV VACCINES  Aged Out   COVID-19 Vaccine  Discontinued   Hepatitis C Screening  Discontinued   HIV Screening  Discontinued      Review of Systems Patient reports no vision/ hearing changes, adenopathy,fever, weight change,  persistant/recurrent hoarseness , swallowing issues, palpitations, edema, hemoptysis, dyspnea (rest/exertional/paroxysmal nocturnal), gastrointestinal bleeding (melena, rectal bleeding), abdominal pain, significant heartburn, bowel changes, GU symptoms (dysuria, hematuria, incontinence), Gyn symptoms (abnormal  bleeding, pain),  syncope, focal weakness, memory loss, numbness & tingling, skin/hair/nail changes, abnormal bruising or bleeding, anxiety, or depression.   + ongoing cough since URI in Sept + episodic chest pains associated w/ anxiety    Objective:   Physical Exam General Appearance:    Alert, cooperative, no distress, appears stated age, obese  Head:    Normocephalic, without obvious abnormality, atraumatic  Eyes:    PERRL, conjunctiva/corneas clear, EOM's intact both eyes  Ears:    Normal TM's and external ear canals, both ears  Nose:   Nares normal, septum midline, mucosa normal, no drainage    or sinus tenderness  Throat:   Lips, mucosa, and tongue normal; teeth and gums normal  Neck:   Supple, symmetrical, trachea midline, no adenopathy;    Thyroid: no enlargement/tenderness/nodules  Back:     Symmetric, no curvature, ROM normal, no  CVA tenderness  Lungs:     Clear to auscultation bilaterally, respirations unlabored  Chest Wall:    No tenderness or deformity   Heart:    Regular rate and rhythm, S1 and S2 normal, no murmur, rub   or gallop  Breast Exam:    Deferred to GYN  Abdomen:     Soft, non-tender, bowel sounds active all four quadrants,    no masses, no organomegaly  Genitalia:    Deferred to GYN  Rectal:    Extremities:   Extremities normal, atraumatic, no cyanosis or edema  Pulses:   2+ and symmetric all extremities  Skin:   Skin color, texture, turgor normal, no rashes or lesions  Lymph nodes:   Cervical, supraclavicular, and axillary nodes normal  Neurologic:   CNII-XII intact, normal strength, sensation and reflexes    throughout          Assessment & Plan:

## 2023-08-27 NOTE — Assessment & Plan Note (Signed)
Ongoing issue for pt.  BMI 49.52.  Encouraged low carb diet, regular exercise.  Will check labs to risk stratify.

## 2023-08-27 NOTE — Assessment & Plan Note (Signed)
Pt's PE WNL w/ exception of BMI.  UTD on pap, immunizations.  Check labs.  Anticipatory guidance provided.  ?

## 2023-08-28 LAB — LIPID PANEL
Cholesterol: 179 mg/dL (ref 0–200)
HDL: 57.5 mg/dL (ref >39.00)
LDL Cholesterol: 99 mg/dL (ref 0–99)
NonHDL: 121.29
Total CHOL/HDL Ratio: 3
Triglycerides: 111 mg/dL (ref 0.0–149.0)
VLDL: 22.2 mg/dL (ref 0.0–40.0)

## 2023-08-28 LAB — CBC WITH DIFFERENTIAL/PLATELET
Basophils Absolute: 0.1 10*3/uL (ref 0.0–0.1)
Basophils Relative: 0.6 % (ref 0.0–3.0)
Eosinophils Absolute: 0.2 10*3/uL (ref 0.0–0.7)
Eosinophils Relative: 2.3 % (ref 0.0–5.0)
HCT: 39.4 % (ref 36.0–46.0)
Hemoglobin: 12.7 g/dL (ref 12.0–15.0)
Lymphocytes Relative: 14.1 % (ref 12.0–46.0)
Lymphs Abs: 1.1 10*3/uL (ref 0.7–4.0)
MCHC: 32.2 g/dL (ref 30.0–36.0)
MCV: 89.9 fL (ref 78.0–100.0)
Monocytes Absolute: 0.6 10*3/uL (ref 0.1–1.0)
Monocytes Relative: 7.2 % (ref 3.0–12.0)
Neutro Abs: 6.2 10*3/uL (ref 1.4–7.7)
Neutrophils Relative %: 75.8 % (ref 43.0–77.0)
Platelets: 333 10*3/uL (ref 150.0–400.0)
RBC: 4.38 Mil/uL (ref 3.87–5.11)
RDW: 14.4 % (ref 11.5–15.5)
WBC: 8.1 10*3/uL (ref 4.0–10.5)

## 2023-08-28 LAB — HEPATIC FUNCTION PANEL
ALT: 10 U/L (ref 0–35)
AST: 17 U/L (ref 0–37)
Albumin: 3.9 g/dL (ref 3.5–5.2)
Alkaline Phosphatase: 96 U/L (ref 39–117)
Bilirubin, Direct: 0 mg/dL (ref 0.0–0.3)
Total Bilirubin: 0.3 mg/dL (ref 0.2–1.2)
Total Protein: 8.1 g/dL (ref 6.0–8.3)

## 2023-08-28 LAB — BASIC METABOLIC PANEL
BUN: 10 mg/dL (ref 6–23)
CO2: 26 meq/L (ref 19–32)
Calcium: 9.3 mg/dL (ref 8.4–10.5)
Chloride: 101 meq/L (ref 96–112)
Creatinine, Ser: 0.72 mg/dL (ref 0.40–1.20)
GFR: 108.39 mL/min (ref >60.00)
Glucose, Bld: 84 mg/dL (ref 70–99)
Potassium: 4.4 meq/L (ref 3.5–5.1)
Sodium: 134 meq/L — ABNORMAL LOW (ref 135–145)

## 2023-08-28 LAB — VITAMIN D 25 HYDROXY (VIT D DEFICIENCY, FRACTURES): VITD: 17.16 ng/mL — ABNORMAL LOW (ref 30.00–100.00)

## 2023-08-28 LAB — HEMOGLOBIN A1C: Hgb A1c MFr Bld: 5.6 % (ref 4.6–6.5)

## 2023-08-28 LAB — TSH: TSH: 1.44 u[IU]/mL (ref 0.35–5.50)

## 2023-08-29 ENCOUNTER — Telehealth: Payer: Self-pay

## 2023-08-29 ENCOUNTER — Other Ambulatory Visit: Payer: Self-pay

## 2023-08-29 MED ORDER — VITAMIN D (ERGOCALCIFEROL) 1.25 MG (50000 UNIT) PO CAPS: 50000.0000 [IU] | ORAL_CAPSULE | ORAL | 0 refills | Status: DC

## 2023-08-29 NOTE — Telephone Encounter (Signed)
-----   Message from Neena Rhymes sent at 08/29/2023  7:34 AM EST ----- Labs look great w/ exception of low Vit D.  Based on this, we need to start 50,000 units weekly x12 weeks in addition to daily OTC supplement of at least 2000 units.

## 2023-09-09 ENCOUNTER — Ambulatory Visit
Admission: RE | Admit: 2023-09-09 | Discharge: 2023-09-09 | Disposition: A | Discharge status: Home / Self Care | Payer: BC Managed Care – PPO | Source: Ambulatory Visit | Attending: Emergency Medicine | Admitting: Emergency Medicine

## 2023-09-09 VITALS — BP 132/86 | HR 102 | Temp 97.9°F | Resp 18 | Ht 64.0 in | Wt 285.0 lb

## 2023-09-09 DIAGNOSIS — R0789 Other chest pain: Secondary | ICD-10-CM

## 2023-09-09 DIAGNOSIS — M25511 Pain in right shoulder: Secondary | ICD-10-CM

## 2023-09-09 MED ORDER — CYCLOBENZAPRINE HCL 10 MG PO TABS: 10.0000 mg | ORAL_TABLET | Freq: Every day | ORAL | 0 refills | Status: DC

## 2023-09-09 MED ORDER — KETOROLAC TROMETHAMINE 30 MG/ML IJ SOLN
30.0000 mg | Freq: Once | INTRAMUSCULAR | Status: AC
  Administered 2023-09-09: 30 mg via INTRAMUSCULAR

## 2023-09-09 MED ORDER — PREDNISONE 20 MG PO TABS: 40.0000 mg | ORAL_TABLET | Freq: Every day | ORAL | 0 refills | Status: DC
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
## 2023-09-09 NOTE — Discharge Instructions (Addendum)
Your pain is most likely caused by irritation to the muscles.  You have been given an injection of Toradol today here in the office which helps to reduce pain and inflammation and ideally will see some improvement within the hour  Once home you may use ibuprofen taking 600 to 800 mg every 6-8 hours and/or Tylenol 500 to 1000 mg every 6 hours as well as any topical medicines, most effective when used consistently  If ineffective he may begin prednisone every morning with food for 5 days to reduce inflammation and help with pain, if you start this medicine you may use Tylenol or topical medicines additionally  You may use muscle relaxants at bedtime as needed for comfort  You may use heating pad in 15 minute intervals as needed for additional comfort, within the first 2-3 days you may find comfort in using ice in 10-15 minutes over affected area  Begin stretching affected area daily for 10 minutes as tolerated to further loosen muscles   When lying down place pillow underneath and between knees for support  Can try sleeping without pillow on firm mattress   Practice good posture: head back, shoulders back, chest forward, pelvis back and weight distributed evenly on both legs  If pain persist after recommended treatment or reoccurs if may be beneficial to follow up with orthopedic specialist for evaluation, this doctor specializes in the bones and can manage your symptoms long-term with options such as but not limited to imaging, medications or physical therapy
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
## 2023-09-12 ENCOUNTER — Other Ambulatory Visit: Payer: Self-pay | Admitting: Family Medicine

## 2023-09-12 DIAGNOSIS — G4700 Insomnia, unspecified: Secondary | ICD-10-CM

## 2023-09-19 ENCOUNTER — Other Ambulatory Visit: Payer: Self-pay

## 2023-10-08 ENCOUNTER — Ambulatory Visit: Payer: BC Managed Care – PPO | Admitting: Family Medicine

## 2023-10-08 ENCOUNTER — Encounter: Payer: Self-pay | Admitting: Family Medicine

## 2023-10-08 VITALS — BP 128/78 | HR 91 | Temp 97.3°F | Ht 64.0 in | Wt 295.4 lb

## 2023-10-08 DIAGNOSIS — F419 Anxiety disorder, unspecified: Secondary | ICD-10-CM

## 2023-10-08 DIAGNOSIS — F32A Depression, unspecified: Secondary | ICD-10-CM

## 2023-10-08 NOTE — Progress Notes (Unsigned)
   Subjective:    Patient ID: Krystal Harris, female    DOB: 1986/02/10, 37 y.o.   MRN: [id number]  HPI Anxiety/depression- currently on Wellbutrin 300mg  daily, Buspar 15mg  BID, Venlafaxine XR 75mg  daily.  Pt is interested in having a baby in the upcoming year.  Pt reports emotionally feeling 'ok' at this time.     Review of Systems For ROS see HPI     Objective:   Physical Exam Vitals reviewed.  Constitutional:      General: She is not in acute distress.    Appearance: Normal appearance. She is obese. She is not ill-appearing.  HENT:     Head: Normocephalic and atraumatic.  Skin:    General: Skin is warm and dry.  Neurological:     General: No focal deficit present.     Mental Status: She is alert and oriented to person, place, and time.  Psychiatric:        Mood and Affect: Mood normal.        Behavior: Behavior normal.        Thought Content: Thought content normal.           Assessment & Plan:

## 2023-10-08 NOTE — Patient Instructions (Signed)
Follow up as needed or as scheduled When you meet with Dr Cherly Hensen, ask about an MFM consult to review meds and any changes that need to be made Call with any questions or concerns HAPPY HOLIDAYS!!!

## 2023-10-09 NOTE — Assessment & Plan Note (Signed)
Ongoing issue for pt.  At this point, she is considering IVF and wants to make sure her medications are safe.  She currently feels that mood is stable and is worried about making changes, but wants pregnancy to be safe.  Encouraged her to talk w/ GYN and ask for MFM consult.  Pt is agreeable.

## 2023-10-28 ENCOUNTER — Other Ambulatory Visit: Payer: Self-pay | Admitting: Family Medicine

## 2023-10-28 DIAGNOSIS — G4700 Insomnia, unspecified: Secondary | ICD-10-CM

## 2023-11-04 NOTE — Progress Notes (Unsigned)
[facility] [phone]   [phone]  PROGRESS NOTE  Patient Care Team: Sheliah Hatch, MD as PCP - General (Family Medicine) Maxie Better, MD as Consulting Physician (Obstetrics and Gynecology)  Hematological/Oncological History # Iron Deficiency Anemia 2/2 to GYN Bleeding 06/06/2022: WBC 6.8, Hgb 9.7, MCV 78, Plt 436, ferritin 5, iron sat 6% 11/16/2022: WBC 5.9, Hgb 11.2, MCV 84, Plt 373, Iron sat 8%, ferritin 8 12/11/2022: establish care with Dr. Leonides Schanz  12/25/2022-01/01/2023: Received IV feraheme 510 mg x 2 doses 08/09/2023:  IV feraheme 510 mg   CHIEF COMPLAINTS/PURPOSE OF CONSULTATION:  " Iron Deficiency Anemia 2/2 to GYN Bleeding "  HISTORY OF PRESENTING ILLNESS:  Krystal Harris 38 y.o. female returns for a follow up for iron deficiency anemia.  Patient was last seen on 05/16/2023. In the interim since the last visit she received 1 dose of Feraheme in October.  Krystal Harris reports her iron infusion went well and she felt a boost in energy.  She reports that she notes that it has begun to "dwindle".  She notes that she noticed a bigger difference with her last infusions.  She reports that she is looking forward to becoming pregnant later this year and is anxious about her iron levels and how that will affect pregnancy.  She reports that she is aiming to have a child in 2026.  She notes she is doing her best to try to eat an iron rich diet including beans and spinach but does not like the liver or red meat.  She reports he does eat some steak but prefers a [place].  She notes that her menstrual cycles have been irregular with her last cycle being 17 days late and lasting for 4 days.  She can change a pad every 4 hours.  She reports that she is currently working with OB/GYN on this.. She otherwise denies any fevers, chills, sweats, shortness of breath, chest pain, cough, headaches or dizziness.  A full 10 point ROS was otherwise negative.  MEDICAL  HISTORY:  Past Medical History:  Diagnosis Date   Anemia    Anxiety    Chicken pox    Depression    Gallbladder problem    GERD (gastroesophageal reflux disease)    PONV (postoperative nausea and vomiting)    vomiting after surgery gallbladder removal 2016   Vitamin D deficiency     SURGICAL HISTORY: Past Surgical History:  Procedure Laterality Date   BRAVO PH STUDY N/A 01/22/2020   Procedure: BRAVO PH STUDY;  Surgeon: Rachael Fee, MD;  Location: [facility];  Service: Endoscopy;  Laterality: N/A;   CHOLECYSTECTOMY     laprascopic   CYST EXCISION Right 12/29/2016   Procedure: EXCISION OF RIGHT AXILLARY CYST;  Surgeon: Axel Filler, MD;  Location: [facility];  Service: General;  Laterality: Right;   DILATATION & CURETTAGE/HYSTEROSCOPY WITH MYOSURE N/A 05/05/2022   Procedure: DILATATION & CURETTAGE/HYSTEROSCOPY WITH MYOSURE;  Surgeon: Maxie Better, MD;  Location: [facility];  Service: Gynecology;  Laterality: N/A;   ESOPHAGOGASTRODUODENOSCOPY N/A 08/04/2020   Procedure: ESOPHAGOGASTRODUODENOSCOPY (EGD) with balloon dilation;  Surgeon: Axel Filler, MD;  Location: [facility];  Service: General;  Laterality: N/A;   ESOPHAGOGASTRODUODENOSCOPY (EGD) WITH PROPOFOL N/A 01/22/2020   Procedure: ESOPHAGOGASTRODUODENOSCOPY (EGD) WITH PROPOFOL;  Surgeon: Rachael Fee, MD;  Location: [facility];  Service: Endoscopy;  Laterality: N/A;   FOOT SURGERY  02/2015   scar tissue and keloid scar removal   FOREIGN BODY REMOVAL Right  09/17/2013   Procedure: REMOVAL FOREIGN BODY NEEDLE PLANTAR ASPECT OF THE FOOT;  Surgeon: Nadara Mustard, MD;  Location: [facility];  Service: Orthopedics;  Laterality: Right;  Remove Foreign Body Right Foot   LAPAROSCOPIC NISSEN FUNDOPLICATION  06/01/2020   WISDOM TOOTH EXTRACTION      SOCIAL HISTORY: Social History   Socioeconomic History   Marital status: Single    Spouse name: Not on file   Number of children: Not on file   Years of education:  Not on file   Highest education level: Master's degree (e.g., MA, MS, MEng, MEd, MSW, MBA)  Occupational History   Not on file  Tobacco Use   Smoking status: Never   Smokeless tobacco: Never  Vaping Use   Vaping status: Never Used  Substance and Sexual Activity   Alcohol use: Yes    Comment: occasionally   Drug use: No   Sexual activity: Yes    Birth control/protection: Pill  Other Topics Concern   Not on file  Social History Narrative   Not on file   Social Drivers of Health   Financial Resource Strain: Not on file  Food Insecurity: Not on file  Transportation Needs: Not on file  Physical Activity: Unknown (05/17/2023)   Exercise Vital Sign    Days of Exercise per Week: 0 days    Minutes of Exercise per Session: Not on file  Stress: No Stress Concern Present (05/17/2023)   [employer] of Occupational Health - Occupational Stress Questionnaire    Feeling of Stress : Only a little  Social Connections: Unknown (05/17/2023)   Social Connection and Isolation Panel [NHANES]    Frequency of Communication with Friends and Family: More than three times a week    Frequency of Social Gatherings with Friends and Family: Twice a week    Attends Religious Services: Not on Marketing executive or Organizations: No    Attends Engineer, structural: Not on file    Marital Status: Never married  Catering manager Violence: Not on file    FAMILY HISTORY: Family History  Problem Relation Age of Onset   Diabetes Mother    Hypertension Mother    Thyroid disease Mother    Obesity Mother    Stroke Maternal Grandmother    Diabetes Maternal Grandfather    Stroke Maternal Grandfather    Hypertension Maternal Grandfather    Lung cancer Maternal Grandfather    Stomach cancer Neg Hx    Colon cancer Neg Hx    Rectal cancer Neg Hx    Pancreatic cancer Neg Hx     ALLERGIES:  is allergic to penicillins.  MEDICATIONS:  Current Outpatient Medications  Medication Sig  Dispense Refill   ALPRAZolam (XANAX) 0.5 MG tablet TAKE 1 TABLET BY MOUTH 2 TIMES DAILY AS NEEDED FOR ANXIETY. (Patient not taking: Reported on 10/08/2023) 30 tablet 0   buPROPion (WELLBUTRIN XL) 300 MG 24 hr tablet TAKE 1 TABLET BY MOUTH EVERY DAY 90 tablet 2   busPIRone (BUSPAR) 15 MG tablet Take 1 tablet (15 mg total) by mouth 2 (two) times daily. 60 tablet 3   Cholecalciferol (VITAMIN D3 PO) Take 3,000 Units by mouth daily at 12 noon.      cyclobenzaprine (FLEXERIL) 10 MG tablet Take 1 tablet (10 mg total) by mouth at bedtime. 10 tablet 0   ibuprofen (ADVIL) 800 MG tablet Take 1 tablet (800 mg total) by mouth every 8 (eight) hours as needed for moderate  pain, mild pain or cramping. 30 tablet 7   Pediatric Multiple Vitamins (FLINSTONES GUMMIES OMEGA-3 DHA PO) Take by mouth.     predniSONE (DELTASONE) 20 MG tablet Take 2 tablets (40 mg total) by mouth daily. 10 tablet 0   traZODone (DESYREL) 100 MG tablet TAKE 1 TABLET BY MOUTH EVERYDAY AT BEDTIME 90 tablet 1   venlafaxine XR (EFFEXOR-XR) 75 MG 24 hr capsule TAKE 1 CAPSULE BY MOUTH DAILY WITH BREAKFAST. 30 capsule 1   Vitamin D, Ergocalciferol, (DRISDOL) 1.25 MG (50000 UNIT) CAPS capsule Take 1 capsule (50,000 Units total) by mouth every 7 (seven) days. 12 capsule 0   No current facility-administered medications for this visit.    REVIEW OF SYSTEMS:   Constitutional: ( - ) fevers, ( - )  chills , ( - ) night sweats Eyes: ( - ) blurriness of vision, ( - ) double vision, ( - ) watery eyes Ears, nose, mouth, throat, and face: ( - ) mucositis, ( - ) sore throat Respiratory: ( - ) cough, ( - ) dyspnea, ( - ) wheezes Cardiovascular: ( - ) palpitation, ( - ) chest discomfort, ( - ) lower extremity swelling Gastrointestinal:  ( - ) nausea, ( - ) heartburn, ( - ) change in bowel habits Skin: ( - ) abnormal skin rashes Lymphatics: ( - ) new lymphadenopathy, ( - ) easy bruising Neurological: ( - ) numbness, ( - ) tingling, ( - ) new  weaknesses Behavioral/Psych: ( - ) mood change, ( - ) new changes  All other systems were reviewed with the patient and are negative.  PHYSICAL EXAMINATION:  There were no vitals filed for this visit.  There were no vitals filed for this visit.   GENERAL: well appearing middle-aged African-American female in NAD  SKIN: skin color, texture, turgor are normal, no rashes or significant lesions EYES: conjunctiva are pink and non-injected, sclera clear LUNGS: clear to auscultation and percussion with normal breathing effort HEART: tachycardic with regular rhythm and no murmurs and no lower extremity edema Musculoskeletal: no cyanosis of digits and no clubbing  PSYCH: alert & oriented x 3, fluent speech NEURO: no focal motor/sensory deficits  LABORATORY DATA:  I have reviewed the data as listed    Latest Ref Rng & Units 08/27/2023    2:54 PM 08/03/2023    7:51 AM 05/16/2023    8:58 AM  CBC  WBC 4.0 - 10.5 K/uL 8.1  6.2  5.8   Hemoglobin 12.0 - 15.0 g/dL 16.1  09.6  04.5   Hematocrit 36.0 - 46.0 % 39.4  37.4  37.6   Platelets 150.0 - 400.0 K/uL 333.0  309  295        Latest Ref Rng & Units 08/27/2023    2:54 PM 05/16/2023    8:58 AM 01/29/2023    4:02 PM  CMP  Glucose 70 - 99 mg/dL 84  409  95   BUN 6 - 23 mg/dL 10  7  10    Creatinine 0.40 - 1.20 mg/dL 8.11  9.14  7.82   Sodium 135 - 145 mEq/L 134  137  137   Potassium 3.5 - 5.1 mEq/L 4.4  3.9  3.7   Chloride 96 - 112 mEq/L 101  105  108   CO2 19 - 32 mEq/L 26  26  22    Calcium 8.4 - 10.5 mg/dL 9.3  9.1  9.6   Total Protein 6.0 - 8.3 g/dL 8.1  7.7  7.9  Total Bilirubin 0.2 - 1.2 mg/dL 0.3  0.3  0.2   Alkaline Phos 39 - 117 U/L 96  85  87   AST 0 - 37 U/L 17  13  13    ALT 0 - 35 U/L 10  7  7       ASSESSMENT & PLAN Krystal Harris returns for a follow up for iron deficiency anemia.   # Iron Deficiency Anemia 2/2 to GYN Bleeding -- Findings are consistent with iron deficiency anemia secondary to patient's  menorrhagia --Encouraged her to follow-up with OB/GYN for better control of her menstrual cycles --unable to tolerate ferrous sulfate 325 mg due to GI upset. Taking [place] vitamin + iron.  --Received IV feraheme 510 mg x 2 doses from 12/25/2022-01/01/2023.  --Labs today shows WBC 5.5, Hgb 11.8, MCV 89.7, Plt 309  --No need for additional IV iron at this time --RTC in 3 months for labs only and 6 months for labs/follow up.   #Tachycardia/palpitations: --HR today was 100 bpm.  --Encouraged patient to limit caffeine and follow up with PCP for further evaluation.   No orders of the defined types were placed in this encounter.   All questions were answered. The patient knows to call the clinic with any problems, questions or concerns.  I have spent a total of 25 minutes minutes of face-to-face and non-face-to-face time, preparing to see the patient, performing a medically appropriate examination, counseling and educating the patient, documenting clinical information in the electronic health record, and care coordination.   Ulysees Barns, MD Department of Hematology/Oncology [facility] Phone: [phone] Pager: [phone] Email: [email or web address]  11/04/2023 5:05 PM

## 2023-11-05 ENCOUNTER — Inpatient Hospital Stay: Payer: 59 | Attending: Internal Medicine

## 2023-11-05 ENCOUNTER — Inpatient Hospital Stay (HOSPITAL_BASED_OUTPATIENT_CLINIC_OR_DEPARTMENT_OTHER): Payer: Self-pay | Admitting: Hematology and Oncology

## 2023-11-05 VITALS — BP 153/93 | HR 83 | Temp 97.4°F | Resp 16 | Wt 295.6 lb

## 2023-11-05 DIAGNOSIS — R002 Palpitations: Secondary | ICD-10-CM | POA: Diagnosis not present

## 2023-11-05 DIAGNOSIS — D500 Iron deficiency anemia secondary to blood loss (chronic): Secondary | ICD-10-CM | POA: Insufficient documentation

## 2023-11-05 DIAGNOSIS — N920 Excessive and frequent menstruation with regular cycle: Secondary | ICD-10-CM | POA: Insufficient documentation

## 2023-11-05 DIAGNOSIS — R000 Tachycardia, unspecified: Secondary | ICD-10-CM | POA: Diagnosis not present

## 2023-11-05 LAB — CBC WITH DIFFERENTIAL (CANCER CENTER ONLY)
Abs Immature Granulocytes: 0.01 10*3/uL (ref 0.00–0.07)
Basophils Absolute: 0 10*3/uL (ref 0.0–0.1)
Basophils Relative: 0 %
Eosinophils Absolute: 0.1 10*3/uL (ref 0.0–0.5)
Eosinophils Relative: 3 %
HCT: 37.3 % (ref 36.0–46.0)
Hemoglobin: 11.8 g/dL — ABNORMAL LOW (ref 12.0–15.0)
Immature Granulocytes: 0 %
Lymphocytes Relative: 18 %
Lymphs Abs: 1 10*3/uL (ref 0.7–4.0)
MCH: 28.4 pg (ref 26.0–34.0)
MCHC: 31.6 g/dL (ref 30.0–36.0)
MCV: 89.7 fL (ref 80.0–100.0)
Monocytes Absolute: 0.3 10*3/uL (ref 0.1–1.0)
Monocytes Relative: 6 %
Neutro Abs: 4 10*3/uL (ref 1.7–7.7)
Neutrophils Relative %: 73 %
Platelet Count: 309 10*3/uL (ref 150–400)
RBC: 4.16 MIL/uL (ref 3.87–5.11)
RDW: 13.1 % (ref 11.5–15.5)
WBC Count: 5.5 10*3/uL (ref 4.0–10.5)
nRBC: 0 % (ref 0.0–0.2)

## 2023-11-05 LAB — IRON AND IRON BINDING CAPACITY (CC-WL,HP ONLY)
Iron: 46 ug/dL (ref 28–170)
Saturation Ratios: 17 % (ref 10.4–31.8)
TIBC: 276 ug/dL (ref 250–450)
UIBC: 230 ug/dL (ref 148–442)

## 2023-11-05 LAB — FERRITIN: Ferritin: 58 ng/mL (ref 11–307)

## 2023-11-06 ENCOUNTER — Telehealth: Payer: Self-pay | Admitting: Hematology and Oncology

## 2023-11-07 ENCOUNTER — Telehealth: Payer: Self-pay | Admitting: *Deleted

## 2023-11-07 NOTE — Telephone Encounter (Addendum)
Contacted patient per MD request with message below. Patient verbalized understanding and states her follow up appts are already scheduled.    ----- Message from Ulysees Barns IV sent at 11/06/2023  2:51 PM EST ----- Please let Krystal Harris know that her ferritin level was 58.  This is great overall.  She does not require more IV iron therapy at this time.  We will plan to see her back in 3 months as scheduled.

## 2023-12-21 ENCOUNTER — Ambulatory Visit: Admitting: Family Medicine

## 2024-01-08 ENCOUNTER — Other Ambulatory Visit: Payer: Self-pay | Admitting: Family Medicine

## 2024-02-01 ENCOUNTER — Inpatient Hospital Stay: Payer: 59 | Attending: Hematology and Oncology

## 2024-02-01 ENCOUNTER — Other Ambulatory Visit: Payer: Self-pay | Admitting: Hematology and Oncology

## 2024-02-01 DIAGNOSIS — D500 Iron deficiency anemia secondary to blood loss (chronic): Secondary | ICD-10-CM | POA: Insufficient documentation

## 2024-02-01 DIAGNOSIS — N920 Excessive and frequent menstruation with regular cycle: Secondary | ICD-10-CM | POA: Diagnosis present

## 2024-02-01 LAB — RETIC PANEL
Immature Retic Fract: 9.5 % (ref 2.3–15.9)
RBC.: 4.4 MIL/uL (ref 3.87–5.11)
Retic Count, Absolute: 44.4 10*3/uL (ref 19.0–186.0)
Retic Ct Pct: 1 % (ref 0.4–3.1)
Reticulocyte Hemoglobin: 31.9 pg (ref >27.9)

## 2024-02-01 LAB — CMP (CANCER CENTER ONLY)
ALT: 7 U/L (ref 0–44)
AST: 14 U/L — ABNORMAL LOW (ref 15–41)
Albumin: 4 g/dL (ref 3.5–5.0)
Alkaline Phosphatase: 72 U/L (ref 38–126)
Anion gap: 7 (ref 5–15)
BUN: 10 mg/dL (ref 6–20)
CO2: 24 mmol/L (ref 22–32)
Calcium: 9.3 mg/dL (ref 8.9–10.3)
Chloride: 106 mmol/L (ref 98–111)
Creatinine: 0.83 mg/dL (ref 0.44–1.00)
GFR, Estimated: >60 mL/min (ref >60)
Glucose, Bld: 89 mg/dL (ref 70–99)
Potassium: 4.3 mmol/L (ref 3.5–5.1)
Sodium: 137 mmol/L (ref 135–145)
Total Bilirubin: 0.3 mg/dL (ref 0.0–1.2)
Total Protein: 8.4 g/dL — ABNORMAL HIGH (ref 6.5–8.1)

## 2024-02-01 LAB — CBC WITH DIFFERENTIAL (CANCER CENTER ONLY)
Abs Immature Granulocytes: 0.01 10*3/uL (ref 0.00–0.07)
Basophils Absolute: 0 10*3/uL (ref 0.0–0.1)
Basophils Relative: 1 %
Eosinophils Absolute: 0.2 10*3/uL (ref 0.0–0.5)
Eosinophils Relative: 4 %
HCT: 37.6 % (ref 36.0–46.0)
Hemoglobin: 12.3 g/dL (ref 12.0–15.0)
Immature Granulocytes: 0 %
Lymphocytes Relative: 19 %
Lymphs Abs: 1 10*3/uL (ref 0.7–4.0)
MCH: 28.3 pg (ref 26.0–34.0)
MCHC: 32.7 g/dL (ref 30.0–36.0)
MCV: 86.4 fL (ref 80.0–100.0)
Monocytes Absolute: 0.5 10*3/uL (ref 0.1–1.0)
Monocytes Relative: 9 %
Neutro Abs: 3.4 10*3/uL (ref 1.7–7.7)
Neutrophils Relative %: 67 %
Platelet Count: 333 10*3/uL (ref 150–400)
RBC: 4.35 MIL/uL (ref 3.87–5.11)
RDW: 12.8 % (ref 11.5–15.5)
WBC Count: 5.1 10*3/uL (ref 4.0–10.5)
nRBC: 0 % (ref 0.0–0.2)

## 2024-02-01 LAB — IRON AND IRON BINDING CAPACITY (CC-WL,HP ONLY)
Iron: 27 ug/dL — ABNORMAL LOW (ref 28–170)
Saturation Ratios: 9 % — ABNORMAL LOW (ref 10.4–31.8)
TIBC: 293 ug/dL (ref 250–450)
UIBC: 266 ug/dL (ref 148–442)

## 2024-02-01 LAB — FERRITIN: Ferritin: 52 ng/mL (ref 11–307)

## 2024-02-08 ENCOUNTER — Telehealth: Payer: Self-pay | Admitting: Hematology and Oncology

## 2024-02-08 ENCOUNTER — Inpatient Hospital Stay: Payer: 59 | Admitting: Hematology and Oncology

## 2024-02-08 NOTE — Progress Notes (Deleted)
 [facility] [phone]   [phone]  PROGRESS NOTE  Patient Care Team: Jess Morita, MD as PCP - General (Family Medicine) Ivery Marking, MD as Consulting Physician (Obstetrics and Gynecology)  Hematological/Oncological History # Iron  Deficiency Anemia 2/2 to GYN Bleeding 06/06/2022: WBC 6.8, Hgb 9.7, MCV 78, Plt 436, ferritin 5, iron  sat 6% 11/16/2022: WBC 5.9, Hgb 11.2, MCV 84, Plt 373, Iron  sat 8%, ferritin 8 12/11/2022: establish care with Dr. Rosaline Coma  12/25/2022-01/01/2023: Received IV feraheme  510 mg x 2 doses 08/09/2023:  IV feraheme  510 mg   CHIEF COMPLAINTS/PURPOSE OF CONSULTATION:  " Iron  Deficiency Anemia 2/2 to GYN Bleeding "  HISTORY OF PRESENTING ILLNESS:  Krystal Harris 38 y.o. female returns for a follow up for iron  deficiency anemia.  Patient was last seen on 05/16/2023. In the interim since the last visit she received 1 dose of Feraheme  in October.  Krystal Harris reports her iron  infusion went well and she felt a boost in energy.  She reports that she notes that it has begun to "dwindle".  She notes that she noticed a bigger difference with her last infusions.  She reports that she is looking forward to becoming pregnant later this year and is anxious about her iron  levels and how that will affect pregnancy.  She reports that she is aiming to have a child in 2026.  She notes she is doing her best to try to eat an iron  rich diet including beans and spinach but does not like the liver or red meat.  She reports he does eat some steak but prefers a [place].  She notes that her menstrual cycles have been irregular with her last cycle being 17 days late and lasting for 4 days.  She can change a pad every 4 hours.  She reports that she is currently working with OB/GYN on this.. She otherwise denies any fevers, chills, sweats, shortness of breath, chest pain, cough, headaches or dizziness.  A full 10 point ROS was otherwise negative.  MEDICAL  HISTORY:  Past Medical History:  Diagnosis Date   Anemia    Anxiety    Chicken pox    Depression    Gallbladder problem    GERD (gastroesophageal reflux disease)    PONV (postoperative nausea and vomiting)    vomiting after surgery gallbladder removal 2016   Vitamin D  deficiency     SURGICAL HISTORY: Past Surgical History:  Procedure Laterality Date   BRAVO PH STUDY N/A 01/22/2020   Procedure: BRAVO PH STUDY;  Surgeon: Janel Medford, MD;  Location: [facility];  Service: Endoscopy;  Laterality: N/A;   CHOLECYSTECTOMY     laprascopic   CYST EXCISION Right 12/29/2016   Procedure: EXCISION OF RIGHT AXILLARY CYST;  Surgeon: Shela Derby, MD;  Location: [facility];  Service: General;  Laterality: Right;   DILATATION & CURETTAGE/HYSTEROSCOPY WITH MYOSURE N/A 05/05/2022   Procedure: DILATATION & CURETTAGE/HYSTEROSCOPY WITH MYOSURE;  Surgeon: Ivery Marking, MD;  Location: [facility];  Service: Gynecology;  Laterality: N/A;   ESOPHAGOGASTRODUODENOSCOPY N/A 08/04/2020   Procedure: ESOPHAGOGASTRODUODENOSCOPY (EGD) with balloon dilation;  Surgeon: Shela Derby, MD;  Location: [facility];  Service: General;  Laterality: N/A;   ESOPHAGOGASTRODUODENOSCOPY (EGD) WITH PROPOFOL  N/A 01/22/2020   Procedure: ESOPHAGOGASTRODUODENOSCOPY (EGD) WITH PROPOFOL ;  Surgeon: Janel Medford, MD;  Location: [facility];  Service: Endoscopy;  Laterality: N/A;   FOOT SURGERY  02/2015   scar tissue and keloid scar removal   FOREIGN BODY REMOVAL Right  09/17/2013   Procedure: REMOVAL FOREIGN BODY NEEDLE PLANTAR ASPECT OF THE FOOT;  Surgeon: Timothy Ford, MD;  Location: [facility];  Service: Orthopedics;  Laterality: Right;  Remove Foreign Body Right Foot   LAPAROSCOPIC NISSEN FUNDOPLICATION  06/01/2020   WISDOM TOOTH EXTRACTION      SOCIAL HISTORY: Social History   Socioeconomic History   Marital status: Single    Spouse name: Not on file   Number of children: Not on file   Years of education:  Not on file   Highest education level: Master's degree (e.g., MA, MS, MEng, MEd, MSW, MBA)  Occupational History   Not on file  Tobacco Use   Smoking status: Never   Smokeless tobacco: Never  Vaping Use   Vaping status: Never Used  Substance and Sexual Activity   Alcohol use: Yes    Comment: occasionally   Drug use: No   Sexual activity: Yes    Birth control/protection: Pill  Other Topics Concern   Not on file  Social History Narrative   Not on file   Social Drivers of Harris   Financial Resource Strain: Not on file  Food Insecurity: Not on file  Transportation Needs: Not on file  Physical Activity: Unknown (05/17/2023)   Exercise Vital Sign    Days of Exercise per Week: 0 days    Minutes of Exercise per Session: Not on file  Stress: No Stress Concern Present (05/17/2023)   Krystal Harris - Occupational Stress Questionnaire    Feeling of Stress : Only a little  Social Connections: Unknown (05/17/2023)   Social Connection and Isolation Panel [NHANES]    Frequency of Communication with Friends and Family: More than three times a week    Frequency of Social Gatherings with Friends and Family: Twice a week    Attends Religious Services: Not on Marketing executive or Organizations: No    Attends Engineer, structural: Not on file    Marital Status: Never married  Catering manager Violence: Not on file    FAMILY HISTORY: Family History  Problem Relation Age of Onset   Diabetes Mother    Hypertension Mother    Thyroid  disease Mother    Obesity Mother    Stroke Maternal Grandmother    Diabetes Maternal Grandfather    Stroke Maternal Grandfather    Hypertension Maternal Grandfather    Lung cancer Maternal Grandfather    Stomach cancer Neg Hx    Colon cancer Neg Hx    Rectal cancer Neg Hx    Pancreatic cancer Neg Hx     ALLERGIES:  is allergic to penicillins.  MEDICATIONS:  Current Outpatient Medications  Medication Sig  Dispense Refill   ALPRAZolam  (XANAX ) 0.5 MG tablet TAKE 1 TABLET BY MOUTH 2 TIMES DAILY AS NEEDED FOR ANXIETY. (Patient not taking: Reported on 10/08/2023) 30 tablet 0   buPROPion  (WELLBUTRIN  XL) 300 MG 24 hr tablet TAKE 1 TABLET BY MOUTH EVERY DAY 90 tablet 2   busPIRone  (BUSPAR ) 15 MG tablet Take 1 tablet (15 mg total) by mouth 2 (two) times daily. 60 tablet 3   Cholecalciferol (VITAMIN D3 PO) Take 3,000 Units by mouth daily at 12 noon.      cyclobenzaprine  (FLEXERIL ) 10 MG tablet Take 1 tablet (10 mg total) by mouth at bedtime. 10 tablet 0   ibuprofen  (ADVIL ) 800 MG tablet Take 1 tablet (800 mg total) by mouth every 8 (eight) hours as needed for moderate  pain, mild pain or cramping. 30 tablet 7   Pediatric Multiple Vitamins (FLINSTONES GUMMIES OMEGA-3 DHA PO) Take by mouth.     predniSONE  (DELTASONE ) 20 MG tablet Take 2 tablets (40 mg total) by mouth daily. 10 tablet 0   traZODone  (DESYREL ) 100 MG tablet TAKE 1 TABLET BY MOUTH EVERYDAY AT BEDTIME 90 tablet 1   venlafaxine  XR (EFFEXOR -XR) 75 MG 24 hr capsule TAKE 1 CAPSULE BY MOUTH DAILY WITH BREAKFAST. 30 capsule 1   Vitamin D , Ergocalciferol , (DRISDOL ) 1.25 MG (50000 UNIT) CAPS capsule Take 1 capsule (50,000 Units total) by mouth every 7 (seven) days. 12 capsule 0   No current facility-administered medications for this visit.    REVIEW OF SYSTEMS:   Constitutional: ( - ) fevers, ( - )  chills , ( - ) night sweats Eyes: ( - ) blurriness of vision, ( - ) double vision, ( - ) watery eyes Ears, nose, mouth, throat, and face: ( - ) mucositis, ( - ) sore throat Respiratory: ( - ) cough, ( - ) dyspnea, ( - ) wheezes Cardiovascular: ( - ) palpitation, ( - ) chest discomfort, ( - ) lower extremity swelling Gastrointestinal:  ( - ) nausea, ( - ) heartburn, ( - ) change in bowel habits Skin: ( - ) abnormal skin rashes Lymphatics: ( - ) new lymphadenopathy, ( - ) easy bruising Neurological: ( - ) numbness, ( - ) tingling, ( - ) new  weaknesses Behavioral/Psych: ( - ) mood change, ( - ) new changes  All other systems were reviewed with the patient and are negative.  PHYSICAL EXAMINATION:  There were no vitals filed for this visit.  There were no vitals filed for this visit.   GENERAL: well appearing middle-aged African-American female in NAD  SKIN: skin color, texture, turgor are normal, no rashes or significant lesions EYES: conjunctiva are pink and non-injected, sclera clear LUNGS: clear to auscultation and percussion with normal breathing effort HEART: tachycardic with regular rhythm and no murmurs and no lower extremity edema Musculoskeletal: no cyanosis of digits and no clubbing  PSYCH: alert & oriented x 3, fluent speech NEURO: no focal motor/sensory deficits  LABORATORY DATA:  I have reviewed the data as listed    Latest Ref Rng & Units 02/01/2024    9:57 AM 11/05/2023   11:31 AM 08/27/2023    2:54 PM  CBC  WBC 4.0 - 10.5 K/uL 5.1  5.5  8.1   Hemoglobin 12.0 - 15.0 g/dL 28.4  13.2  44.0   Hematocrit 36.0 - 46.0 % 37.6  37.3  39.4   Platelets 150 - 400 K/uL 333  309  333.0        Latest Ref Rng & Units 02/01/2024    9:57 AM 08/27/2023    2:54 PM 05/16/2023    8:58 AM  CMP  Glucose 70 - 99 mg/dL 89  84  102   BUN 6 - 20 mg/dL 10  10  7    Creatinine 0.44 - 1.00 mg/dL 7.25  3.66  4.40   Sodium 135 - 145 mmol/L 137  134  137   Potassium 3.5 - 5.1 mmol/L 4.3  4.4  3.9   Chloride 98 - 111 mmol/L 106  101  105   CO2 22 - 32 mmol/L 24  26  26    Calcium 8.9 - 10.3 mg/dL 9.3  9.3  9.1   Total Protein 6.5 - 8.1 g/dL 8.4  8.1  7.7   Total  Bilirubin 0.0 - 1.2 mg/dL 0.3  0.3  0.3   Alkaline Phos 38 - 126 U/L 72  96  85   AST 15 - 41 U/L 14  17  13    ALT 0 - 44 U/L 7  10  7       ASSESSMENT & PLAN AMORE ACKMAN returns for a follow up for iron  deficiency anemia.   # Iron  Deficiency Anemia 2/2 to GYN Bleeding -- Findings are consistent with iron  deficiency anemia secondary to patient's  menorrhagia --Encouraged her to follow-up with OB/GYN for better control of her menstrual cycles --unable to tolerate ferrous sulfate  325 mg due to GI upset. Taking Flintstone vitamin + iron .  --Received IV feraheme  510 mg x 2 doses from 12/25/2022-01/01/2023.  --Labs today shows WBC 5.5, Hgb 11.8, MCV 89.7, Plt 309  --No need for additional IV iron  at this time --RTC in 3 months for labs only and 6 months for labs/follow up.   #Tachycardia/palpitations: --HR today was 100 bpm.  --Encouraged patient to limit caffeine and follow up with PCP for further evaluation.   No orders of the defined types were placed in this encounter.   All questions were answered. The patient knows to call the clinic with any problems, questions or concerns.  I have spent a total of 25 minutes minutes of face-to-face and non-face-to-face time, preparing to see the patient, performing a medically appropriate examination, counseling and educating the patient, documenting clinical information in the electronic [facility] record, and care coordination.   Rogerio Clay, MD Department of Hematology/Oncology [facility] Phone: [phone] Pager: [phone] Email: [email or web address]  02/08/2024 7:37 AM

## 2024-02-13 ENCOUNTER — Telehealth: Admitting: Physician Assistant

## 2024-02-15 ENCOUNTER — Inpatient Hospital Stay: Attending: Hematology and Oncology | Admitting: Physician Assistant

## 2024-02-15 DIAGNOSIS — D500 Iron deficiency anemia secondary to blood loss (chronic): Secondary | ICD-10-CM

## 2024-02-15 NOTE — Progress Notes (Unsigned)
 [facility] [phone]   Fax:([phone]  PROGRESS NOTE  I connected with Krystal Harris  on 02/15/2024 by telephone visit and verified that I am speaking with the correct person using two identifiers.   I discussed the limitations, risks, security and privacy concerns of performing an evaluation and management service by telemedicine and the availability of in-person appointments. I also discussed with the patient that there may be a patient responsible charge related to this service. The patient expressed understanding and agreed to proceed.  Patient's location: Home Provider's location: Office  Patient Care Team: Jess Morita, MD as PCP - General (Family Medicine) Ivery Marking, MD as Consulting Physician (Obstetrics and Gynecology)  Hematological/Oncological History # Iron  Deficiency Anemia 2/2 to GYN Bleeding 06/06/2022: WBC 6.8, Hgb 9.7, MCV 78, Plt 436, ferritin 5, iron  sat 6% 11/16/2022: WBC 5.9, Hgb 11.2, MCV 84, Plt 373, Iron  sat 8%, ferritin 8 12/11/2022: establish care with Dr. Rosaline Coma  12/25/2022-01/01/2023: Received IV feraheme  510 mg x 2 doses 08/09/2023:  IV feraheme  510 mg   CHIEF COMPLAINTS/PURPOSE OF CONSULTATION:  " Iron  Deficiency Anemia 2/2 to GYN Bleeding "  HISTORY OF PRESENTING ILLNESS:  Krystal Harris 38 y.o. female returns for a follow up for iron  deficiency anemia.  Patient was last seen on 11/04/2022. In the interim since the last visit she denies any changes to her health.   Ms. Ribbeck reports having more energy since undergoing her most recent IV iron  infusion back in October 2024. She continues to complete her ADLs on her own. She denies nausea, vomiting or bowel habit changes. She continues to have monthly menstrual cycles lasting 5-6 days with 2 days of heavy bleeding. She recently saw her gynecologist with suggestions to undergo surgery to remove her fibroids. She adds that she is trying to get pregnant as well.  She denies fevers, chills,sweats, shortness of breath, chest pain or cough. She has no other complaints. Rest of the 10 point ROS is below.     MEDICAL HISTORY:  Past Medical History:  Diagnosis Date   Anemia    Anxiety    Chicken pox    Depression    Gallbladder problem    GERD (gastroesophageal reflux disease)    PONV (postoperative nausea and vomiting)    vomiting after surgery gallbladder removal 2016   Vitamin D  deficiency     SURGICAL HISTORY: Past Surgical History:  Procedure Laterality Date   BRAVO PH STUDY N/A 01/22/2020   Procedure: BRAVO PH STUDY;  Surgeon: Janel Medford, MD;  Location: [facility];  Service: Endoscopy;  Laterality: N/A;   CHOLECYSTECTOMY     laprascopic   CYST EXCISION Right 12/29/2016   Procedure: EXCISION OF RIGHT AXILLARY CYST;  Surgeon: Shela Derby, MD;  Location: [facility];  Service: General;  Laterality: Right;   DILATATION & CURETTAGE/HYSTEROSCOPY WITH MYOSURE N/A 05/05/2022   Procedure: DILATATION & CURETTAGE/HYSTEROSCOPY WITH MYOSURE;  Surgeon: Ivery Marking, MD;  Location: [facility];  Service: Gynecology;  Laterality: N/A;   ESOPHAGOGASTRODUODENOSCOPY N/A 08/04/2020   Procedure: ESOPHAGOGASTRODUODENOSCOPY (EGD) with balloon dilation;  Surgeon: Shela Derby, MD;  Location: [facility];  Service: General;  Laterality: N/A;   ESOPHAGOGASTRODUODENOSCOPY (EGD) WITH PROPOFOL  N/A 01/22/2020   Procedure: ESOPHAGOGASTRODUODENOSCOPY (EGD) WITH PROPOFOL ;  Surgeon: Janel Medford, MD;  [facility]: [facility];  Service: Endoscopy;  Laterality: N/A;   FOOT SURGERY  02/2015   scar tissue and keloid scar removal   FOREIGN BODY REMOVAL Right 09/17/2013  Procedure: REMOVAL FOREIGN BODY NEEDLE PLANTAR ASPECT OF THE FOOT;  Surgeon: Timothy Ford, MD;  Location: [facility];  Service: Orthopedics;  Laterality: Right;  Remove Foreign Body Right Foot   LAPAROSCOPIC NISSEN FUNDOPLICATION  06/01/2020   WISDOM TOOTH EXTRACTION      SOCIAL  HISTORY: Social History   Socioeconomic History   Marital status: Single    Spouse name: Not on file   Number of children: Not on file   Years of education: Not on file   Highest education level: Master's degree (e.g., MA, MS, MEng, MEd, MSW, MBA)  Occupational History   Not on file  Tobacco Use   Smoking status: Never   Smokeless tobacco: Never  Vaping Use   Vaping status: Never Used  Substance and Sexual Activity   Alcohol use: Yes    Comment: occasionally   Drug use: No   Sexual activity: Yes    Birth control/protection: Pill  Other Topics Concern   Not on file  Social History Narrative   Not on file   Social Drivers of Health   Financial Resource Strain: Not on file  Food Insecurity: Not on file  Transportation Needs: Not on file  Physical Activity: Unknown (05/17/2023)   Exercise Vital Sign    Days of Exercise per Week: 0 days    Minutes of Exercise per Session: Not on file  Stress: No Stress Concern Present (05/17/2023)   [employer] of Occupational Health - Occupational Stress Questionnaire    Feeling of Stress : Only a little  Social Connections: Unknown (05/17/2023)   Social Connection and Isolation Panel [NHANES]    Frequency of Communication with Friends and Family: More than three times a week    Frequency of Social Gatherings with Friends and Family: Twice a week    Attends Religious Services: Not on Marketing executive or Organizations: No    Attends Engineer, structural: Not on file    Marital Status: Never married  Catering manager Violence: Not on file    FAMILY HISTORY: Family History  Problem Relation Age of Onset   Diabetes Mother    Hypertension Mother    Thyroid  disease Mother    Obesity Mother    Stroke Maternal Grandmother    Diabetes Maternal Grandfather    Stroke Maternal Grandfather    Hypertension Maternal Grandfather    Lung cancer Maternal Grandfather    Stomach cancer Neg Hx    Colon cancer Neg Hx     Rectal cancer Neg Hx    Pancreatic cancer Neg Hx     ALLERGIES:  is allergic to penicillins.  MEDICATIONS:  Current Outpatient Medications  Medication Sig Dispense Refill   ALPRAZolam  (XANAX ) 0.5 MG tablet TAKE 1 TABLET BY MOUTH 2 TIMES DAILY AS NEEDED FOR ANXIETY. (Patient not taking: Reported on 10/08/2023) 30 tablet 0   buPROPion  (WELLBUTRIN  XL) 300 MG 24 hr tablet TAKE 1 TABLET BY MOUTH EVERY DAY 90 tablet 2   busPIRone  (BUSPAR ) 15 MG tablet Take 1 tablet (15 mg total) by mouth 2 (two) times daily. 60 tablet 3   Cholecalciferol (VITAMIN D3 PO) Take 3,000 Units by mouth daily at 12 noon.      cyclobenzaprine  (FLEXERIL ) 10 MG tablet Take 1 tablet (10 mg total) by mouth at bedtime. 10 tablet 0   ibuprofen  (ADVIL ) 800 MG tablet Take 1 tablet (800 mg total) by mouth every 8 (eight) hours as needed for moderate pain, mild pain  or cramping. 30 tablet 7   Pediatric Multiple Vitamins (FLINSTONES GUMMIES OMEGA-3 DHA PO) Take by mouth.     predniSONE  (DELTASONE ) 20 MG tablet Take 2 tablets (40 mg total) by mouth daily. 10 tablet 0   traZODone  (DESYREL ) 100 MG tablet TAKE 1 TABLET BY MOUTH EVERYDAY AT BEDTIME 90 tablet 1   venlafaxine  XR (EFFEXOR -XR) 75 MG 24 hr capsule TAKE 1 CAPSULE BY MOUTH DAILY WITH BREAKFAST. 30 capsule 1   Vitamin D , Ergocalciferol , (DRISDOL ) 1.25 MG (50000 UNIT) CAPS capsule Take 1 capsule (50,000 Units total) by mouth every 7 (seven) days. 12 capsule 0   No current facility-administered medications for this visit.    REVIEW OF SYSTEMS:   Constitutional: ( - ) fevers, ( - )  chills , ( - ) night sweats Eyes: ( - ) blurriness of vision, ( - ) double vision, ( - ) watery eyes Ears, nose, mouth, throat, and face: ( - ) mucositis, ( - ) sore throat Respiratory: ( - ) cough, ( - ) dyspnea, ( - ) wheezes Cardiovascular: ( - ) palpitation, ( - ) chest discomfort, ( - ) lower extremity swelling Gastrointestinal:  ( - ) nausea, ( - ) heartburn, ( - ) change in bowel  habits Skin: ( - ) abnormal skin rashes Lymphatics: ( - ) new lymphadenopathy, ( - ) easy bruising Neurological: ( - ) numbness, ( - ) tingling, ( - ) new weaknesses Behavioral/Psych: ( - ) mood change, ( - ) new changes  All other systems were reviewed with the patient and are negative.  PHYSICAL EXAMINATION: Not performed due to virtual visit.   LABORATORY DATA:  I have reviewed the data as listed    Latest Ref Rng & Units 02/01/2024    9:57 AM 11/05/2023   11:31 AM 08/27/2023    2:54 PM  CBC  WBC 4.0 - 10.5 K/uL 5.1  5.5  8.1   Hemoglobin 12.0 - 15.0 g/dL 16.1  09.6  04.5   Hematocrit 36.0 - 46.0 % 37.6  37.3  39.4   Platelets 150 - 400 K/uL 333  309  333.0        Latest Ref Rng & Units 02/01/2024    9:57 AM 08/27/2023    2:54 PM 05/16/2023    8:58 AM  CMP  Glucose 70 - 99 mg/dL 89  84  409   BUN 6 - 20 mg/dL 10  10  7    Creatinine 0.44 - 1.00 mg/dL 8.11  9.14  7.82   Sodium 135 - 145 mmol/L 137  134  137   Potassium 3.5 - 5.1 mmol/L 4.3  4.4  3.9   Chloride 98 - 111 mmol/L 106  101  105   CO2 22 - 32 mmol/L 24  26  26    Calcium 8.9 - 10.3 mg/dL 9.3  9.3  9.1   Total Protein 6.5 - 8.1 g/dL 8.4  8.1  7.7   Total Bilirubin 0.0 - 1.2 mg/dL 0.3  0.3  0.3   Alkaline Phos 38 - 126 U/L 72  96  85   AST 15 - 41 U/L 14  17  13    ALT 0 - 44 U/L 7  10  7       ASSESSMENT & PLAN REYANNA CLERMONT returns for a follow up for iron  deficiency anemia.   # Iron  Deficiency Anemia 2/2 to GYN Bleeding -- Findings are consistent with iron  deficiency anemia secondary to patient's menorrhagia --Encouraged  her to follow-up with OB/GYN for better control of her menstrual cycles --unable to tolerate ferrous sulfate  325 mg due to GI upset. Taking Flintstone vitamin + iron .  --Last received IV feraheme  510 mg x 1 dose in October 2024. --Labs from 02/01/2024 showed anemia with hemoglobin 12.3, MCV 86.4.  Iron  panel shows mild deficiency with ferritin 62 saturation 9% and iron  27. -- Recommend IV  Feraheme  510 mg x 1 dose to bolster iron  levels. --RTC in 3 months for labs only and 6 months for labs/follow up.   No orders of the defined types were placed in this encounter.   All questions were answered. The patient knows to call the clinic with any problems, questions or concerns.  I have spent a total of 25 minutes minutes of  non-face-to-face time, preparing to see the patient,  counseling and educating the patient, documenting clinical information in the electronic health record, and care coordination.    02/18/2024 11:15 PM

## 2024-02-18 ENCOUNTER — Encounter: Payer: Self-pay | Admitting: Physician Assistant

## 2024-02-25 ENCOUNTER — Ambulatory Visit: Payer: BC Managed Care – PPO | Admitting: Family Medicine

## 2024-02-25 ENCOUNTER — Other Ambulatory Visit: Payer: Self-pay | Admitting: Physician Assistant

## 2024-02-26 ENCOUNTER — Ambulatory Visit (INDEPENDENT_AMBULATORY_CARE_PROVIDER_SITE_OTHER): Admitting: Family Medicine

## 2024-02-26 ENCOUNTER — Telehealth: Payer: Self-pay

## 2024-02-26 ENCOUNTER — Encounter: Payer: Self-pay | Admitting: Family Medicine

## 2024-02-26 VITALS — BP 122/74 | HR 90 | Temp 98.0°F | Ht 64.0 in | Wt 290.2 lb

## 2024-02-26 DIAGNOSIS — E559 Vitamin D deficiency, unspecified: Secondary | ICD-10-CM | POA: Diagnosis not present

## 2024-02-26 DIAGNOSIS — Z114 Encounter for screening for human immunodeficiency virus [HIV]: Secondary | ICD-10-CM

## 2024-02-26 DIAGNOSIS — G4700 Insomnia, unspecified: Secondary | ICD-10-CM

## 2024-02-26 DIAGNOSIS — F419 Anxiety disorder, unspecified: Secondary | ICD-10-CM | POA: Diagnosis not present

## 2024-02-26 DIAGNOSIS — Z1159 Encounter for screening for other viral diseases: Secondary | ICD-10-CM | POA: Diagnosis not present

## 2024-02-26 DIAGNOSIS — F32A Depression, unspecified: Secondary | ICD-10-CM

## 2024-02-26 LAB — HEPATIC FUNCTION PANEL
ALT: 9 U/L (ref 0–35)
AST: 14 U/L (ref 0–37)
Albumin: 3.8 g/dL (ref 3.5–5.2)
Alkaline Phosphatase: 74 U/L (ref 39–117)
Bilirubin, Direct: 0 mg/dL (ref 0.0–0.3)
Total Bilirubin: 0.3 mg/dL (ref 0.2–1.2)
Total Protein: 7.9 g/dL (ref 6.0–8.3)

## 2024-02-26 LAB — CBC WITH DIFFERENTIAL/PLATELET
Basophils Absolute: 0 10*3/uL (ref 0.0–0.1)
Basophils Relative: 0.3 % (ref 0.0–3.0)
Eosinophils Absolute: 0.1 10*3/uL (ref 0.0–0.7)
Eosinophils Relative: 1.6 % (ref 0.0–5.0)
HCT: 37.5 % (ref 36.0–46.0)
Hemoglobin: 11.8 g/dL — ABNORMAL LOW (ref 12.0–15.0)
Lymphocytes Relative: 16.7 % (ref 12.0–46.0)
Lymphs Abs: 1 10*3/uL (ref 0.7–4.0)
MCHC: 31.5 g/dL (ref 30.0–36.0)
MCV: 87.7 fl (ref 78.0–100.0)
Monocytes Absolute: 0.3 10*3/uL (ref 0.1–1.0)
Monocytes Relative: 5.1 % (ref 3.0–12.0)
Neutro Abs: 4.6 10*3/uL (ref 1.4–7.7)
Neutrophils Relative %: 76.3 % (ref 43.0–77.0)
Platelets: 357 10*3/uL (ref 150.0–400.0)
RBC: 4.27 Mil/uL (ref 3.87–5.11)
RDW: 13.7 % (ref 11.5–15.5)
WBC: 6 10*3/uL (ref 4.0–10.5)

## 2024-02-26 LAB — BASIC METABOLIC PANEL WITH GFR
BUN: 8 mg/dL (ref 6–23)
CO2: 26 meq/L (ref 19–32)
Calcium: 9.1 mg/dL (ref 8.4–10.5)
Chloride: 104 meq/L (ref 96–112)
Creatinine, Ser: 0.73 mg/dL (ref 0.40–1.20)
GFR: 106.23 mL/min (ref >60.00)
Glucose, Bld: 87 mg/dL (ref 70–99)
Potassium: 4 meq/L (ref 3.5–5.1)
Sodium: 137 meq/L (ref 135–145)

## 2024-02-26 LAB — VITAMIN D 25 HYDROXY (VIT D DEFICIENCY, FRACTURES): VITD: 17.01 ng/mL — ABNORMAL LOW (ref 30.00–100.00)

## 2024-02-26 LAB — HEMOGLOBIN A1C: Hgb A1c MFr Bld: 5.6 % (ref 4.6–6.5)

## 2024-02-26 LAB — LIPID PANEL
Cholesterol: 178 mg/dL (ref 0–200)
HDL: 52.2 mg/dL (ref >39.00)
LDL Cholesterol: 113 mg/dL — ABNORMAL HIGH (ref 0–99)
NonHDL: 126.21
Total CHOL/HDL Ratio: 3
Triglycerides: 66 mg/dL (ref 0.0–149.0)
VLDL: 13.2 mg/dL (ref 0.0–40.0)

## 2024-02-26 LAB — TSH: TSH: 1.09 u[IU]/mL (ref 0.35–5.50)

## 2024-02-26 MED ORDER — SERTRALINE HCL 50 MG PO TABS: 50.0000 mg | ORAL_TABLET | Freq: Every day | ORAL | 3 refills | Status: AC

## 2024-02-26 MED ORDER — TRAZODONE HCL 100 MG PO TABS: 100.0000 mg | ORAL_TABLET | Freq: Every day | ORAL | 1 refills | Status: DC

## 2024-02-26 NOTE — Assessment & Plan Note (Signed)
 Ongoing issue.  She is down 5 lbs since December.  Stressed need for healthy diet and regular physical activity since she is hoping to be pregnant later this summer.  Will check labs to risk stratify.

## 2024-02-26 NOTE — Patient Instructions (Signed)
 Follow up in 1 month to recheck mood We'll notify you of your lab results and make any changes CONTINUE the Wellbutrin  ADD the Sertraline  daily CONTINUE the Trazodone  at least through surgery Continue to work on healthy diet and regular exercise- you're doing it!!! Call with any questions or concerns Stay Safe!  Stay Healthy! You've got this!!!

## 2024-02-26 NOTE — Assessment & Plan Note (Signed)
 Deteriorated.  Pt stopped Effexor  b/c the plan was to start fertility treatments and she was told this was not safe.  Since stopping medication she is really struggling and has had some additional unplanned complications.  Talked to her about taking things 1 step at a time- first EOGs, then surgery, then the fertility journey.  Told her that viewing the whole picture would be overwhelming.  Will start Sertraline  as this was deemed safe during pregnancy and she doesn't want to start something, then switch to something different.  Pt expressed understanding and is in agreement w/ plan.

## 2024-02-26 NOTE — Telephone Encounter (Signed)
 Delores Fester, patient will be scheduled as soon as possible.  Auth Submission: NO AUTH NEEDED Site of care: Site of care: [facility] Payer: [employer] state health plan Medication & CPT/J Code(s) submitted: Venofer (Iron  Sucrose) J1756 Route of submission (phone, fax, portal): portal Phone # Fax # Auth type: Buy/Bill PB Units/visits requested: 200mg  x 3 doses Reference number:  Approval from: 02/26/24 to 07/28/24

## 2024-02-26 NOTE — Progress Notes (Signed)
   Subjective:    Patient ID: Krystal Harris, female    DOB: October 13, 1986, 38 y.o.   MRN: [id number]  HPI Anxiety/Depression- ongoing issue.  Currently on Wellbutrin .  Stopped Effexor  and Alprazolam  in hopes of pregnancy.  Fertility work up showed multiple fibroids and blocked fallopian tube.  She now has to have surgery and is understandably upset.  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  Was told she will need to stop Trazodone  prior to pregnancy but is currently taking regularly.  Obesity- pt is down 5 lbs since December.  BMI 49.82   Review of Systems For ROS see HPI     Objective:   Physical Exam Vitals reviewed.  Constitutional:      General: She is not in acute distress.    Appearance: She is obese.  HENT:     Head: Normocephalic and atraumatic.  Cardiovascular:     Rate and Rhythm: Normal rate and regular rhythm.  Pulmonary:     Effort: Pulmonary effort is normal. No respiratory distress.  Skin:    General: Skin is warm and dry.  Neurological:     General: No focal deficit present.     Mental Status: She is alert and oriented to person, place, and time.  Psychiatric:     Comments: Tearful, understandably upset and anxious           Assessment & Plan:

## 2024-02-26 NOTE — Assessment & Plan Note (Signed)
 Continue Trazodone  for time being until she is ready to start fertility journey

## 2024-02-27 ENCOUNTER — Ambulatory Visit: Payer: Self-pay | Admitting: Family Medicine

## 2024-02-27 ENCOUNTER — Other Ambulatory Visit: Payer: Self-pay

## 2024-02-27 ENCOUNTER — Other Ambulatory Visit: Payer: Self-pay | Admitting: Obstetrics and Gynecology

## 2024-02-27 LAB — HEPATITIS C ANTIBODY: Hepatitis C Ab: NONREACTIVE

## 2024-02-27 LAB — HIV ANTIBODY (ROUTINE TESTING W REFLEX): HIV 1&2 Ab, 4th Generation: NONREACTIVE

## 2024-02-27 MED ORDER — VITAMIN D (ERGOCALCIFEROL) 1.25 MG (50000 UNIT) PO CAPS: 50000.0000 [IU] | ORAL_CAPSULE | ORAL | 0 refills | Status: DC

## 2024-02-27 NOTE — Telephone Encounter (Signed)
 Vitamin D  has been sent Pt has reviewed labs via MyChart

## 2024-02-27 NOTE — Telephone Encounter (Signed)
-----   Message from Laymon Priest sent at 02/27/2024  7:32 AM EDT ----- Labs look good w/ exception of low Vit D.  Based on this, we need to start 50,000 units weekly x12 weeks in addition to daily OTC supplement of at least 2000 units.

## 2024-03-03 ENCOUNTER — Ambulatory Visit

## 2024-03-03 VITALS — BP 130/86 | HR 85 | Temp 98.1°F | Resp 18 | Ht 64.0 in | Wt 289.4 lb

## 2024-03-03 DIAGNOSIS — D509 Iron deficiency anemia, unspecified: Secondary | ICD-10-CM

## 2024-03-03 MED ORDER — IRON SUCROSE 20 MG/ML IV SOLN
200.0000 mg | Freq: Once | INTRAVENOUS | Status: AC
  Administered 2024-03-03: 200 mg via INTRAVENOUS
  Filled 2024-03-03: qty 10

## 2024-03-03 NOTE — Progress Notes (Signed)
 Diagnosis: Iron Deficiency Anemia  Provider:  Chilton Greathouse MD  Procedure: IV Push  IV Type: Peripheral, IV Location: R Antecubital  Venofer (Iron Sucrose), Dose: 200 mg  Post Infusion IV Care: Observation period completed and Peripheral IV Discontinued  Discharge: Condition: Good, Destination: Home . AVS Provided  Performed by:  Rico Ala, LPN

## 2024-03-11 ENCOUNTER — Ambulatory Visit

## 2024-03-11 ENCOUNTER — Other Ambulatory Visit: Payer: Self-pay | Admitting: Family Medicine

## 2024-03-11 VITALS — BP 126/79 | HR 104 | Temp 97.9°F | Resp 20 | Ht 64.0 in | Wt 295.6 lb

## 2024-03-11 DIAGNOSIS — D509 Iron deficiency anemia, unspecified: Secondary | ICD-10-CM | POA: Diagnosis not present

## 2024-03-11 MED ORDER — IRON SUCROSE 20 MG/ML IV SOLN
200.0000 mg | Freq: Once | INTRAVENOUS | Status: AC
Start: 2024-03-11 — End: 2024-03-11
  Administered 2024-03-11: 200 mg via INTRAVENOUS
  Filled 2024-03-11: qty 10

## 2024-03-11 NOTE — Progress Notes (Signed)
 Diagnosis: Iron  Deficiency Anemia  Provider:  Praveen Mannam MD  Procedure: IV Push  IV Type: Peripheral, IV Location: R Forearm  Venofer  (Iron  Sucrose), Dose: 200 mg  Post Infusion IV Care: Patient declined observation  Discharge: Condition: Good, Destination: Home . AVS Declined  Performed by:  Rachelle Bue, RN

## 2024-03-18 ENCOUNTER — Ambulatory Visit

## 2024-03-18 VITALS — BP 135/87 | HR 89 | Temp 98.6°F | Resp 20 | Ht 64.0 in | Wt 290.0 lb

## 2024-03-18 DIAGNOSIS — D509 Iron deficiency anemia, unspecified: Secondary | ICD-10-CM | POA: Diagnosis not present

## 2024-03-18 MED ORDER — IRON SUCROSE 20 MG/ML IV SOLN
200.0000 mg | Freq: Once | INTRAVENOUS | Status: AC
  Administered 2024-03-18: 200 mg via INTRAVENOUS
  Filled 2024-03-18: qty 10

## 2024-03-18 NOTE — Progress Notes (Signed)
 Diagnosis: Iron  Deficiency Anemia  Provider:  Praveen Mannam MD  Procedure: IV Push  IV Type: Peripheral, IV Location: R Antecubital  Venofer  (Iron  Sucrose), Dose: 200 mg  Post Infusion IV Care: Patient declined observation  Discharge: Condition: Good, Destination: Home . AVS Declined  Performed by:  Rachelle Bue, RN

## 2024-03-21 ENCOUNTER — Encounter (HOSPITAL_COMMUNITY): Payer: Self-pay | Admitting: Obstetrics and Gynecology

## 2024-03-21 NOTE — Progress Notes (Signed)
 Spoke w/ via phone for pre-op interview--- Mikeal Alder Lab needs dos----   CBC and UPT      Lab results------ COVID test -----patient states asymptomatic no test needed Arrive at -------0900 NPO after MN NO Solid Food.  Clear liquids from MN until---0800 Pre-Surgery Ensure or G2:  Med rec completed Medications to take morning of surgery ----- Wellbutrin  and Zoloft  Diabetic medication -----  GLP1 agonist last dose: GLP1 instructions:  Patient instructed no nail polish to be worn day of surgery Patient instructed to bring photo id and insurance card day of surgery Patient aware to have Driver (ride ) / caregiver    for 24 hours after surgery - Joeseph Muss Patient Special Instructions ----- Shower with antibacterial soap. Pre-Op special Instructions -----  Patient verbalized understanding of instructions that were given at this phone interview. Patient denies chest pain, sob, fever, cough at the interview.

## 2024-03-27 ENCOUNTER — Ambulatory Visit: Admitting: Family Medicine

## 2024-03-27 ENCOUNTER — Telehealth (INDEPENDENT_AMBULATORY_CARE_PROVIDER_SITE_OTHER): Admitting: Family Medicine

## 2024-03-27 ENCOUNTER — Encounter: Payer: Self-pay | Admitting: Family Medicine

## 2024-03-27 VITALS — Wt 289.0 lb

## 2024-03-27 DIAGNOSIS — F419 Anxiety disorder, unspecified: Secondary | ICD-10-CM | POA: Diagnosis not present

## 2024-03-27 DIAGNOSIS — F32A Depression, unspecified: Secondary | ICD-10-CM

## 2024-03-27 NOTE — Progress Notes (Signed)
 Virtual Visit via Video   I connected with patient on 03/27/24 at  8:00 AM EDT by a video enabled telemedicine application and verified that I am speaking with the correct person using two identifiers.  Location patient: Home Location provider: [facility], Office Persons participating in the virtual visit: Patient, Provider, CMA Mia Adam F)  I discussed the limitations of evaluation and management by telemedicine and the availability of in person appointments. The patient expressed understanding and agreed to proceed.  Subjective:   HPI:   Anxiety/Depression- last visit we started Sertraline .  This was in addition to her Wellbutrin  and Trazodone .  Pt reports feeling 'a lot better'.  Pt reports feeling like herself again, feels that there is a balance at this time.  ROS:   See pertinent positives and negatives per HPI.  Patient Active Problem List   Diagnosis Date Noted   Iron  deficiency anemia 09/12/2021   Vitamin D  deficiency 09/12/2021   Status post robot-assisted surgical procedure 06/01/2020   S/P Nissen fundoplication (without gastrostomy tube) procedure 06/01/2020   GERD (gastroesophageal reflux disease) 05/01/2019   Knee osteoarthritis 09/12/2017   Morbid obesity (HCC) 08/13/2017   Anxiety and depression 04/11/2017   Insomnia 04/11/2017   Fatty liver 09/27/2016   Physical exam 02/21/2013    Social History   Tobacco Use   Smoking status: Never   Smokeless tobacco: Never  Substance Use Topics   Alcohol use: Yes    Comment: occasionally    Current Outpatient Medications:    buPROPion  (WELLBUTRIN  XL) 300 MG 24 hr tablet, TAKE 1 TABLET BY MOUTH EVERY DAY, Disp: 90 tablet, Rfl: 3   busPIRone  (BUSPAR ) 15 MG tablet, Take 1 tablet (15 mg total) by mouth 2 (two) times daily., Disp: 60 tablet, Rfl: 3   Cholecalciferol (VITAMIN D3 PO), Take 3,000 Units by mouth daily at 12 noon. , Disp: , Rfl:    ibuprofen  (ADVIL ) 800 MG tablet, Take 1 tablet (800 mg total) by  mouth every 8 (eight) hours as needed for moderate pain, mild pain or cramping., Disp: 30 tablet, Rfl: 7   Prenatal Vit-Fe Fumarate-FA (PRENATAL MULTIVITAMIN) TABS tablet, Take 1 tablet by mouth daily at 12 noon., Disp: , Rfl:    sertraline  (ZOLOFT ) 50 MG tablet, Take 1 tablet (50 mg total) by mouth daily., Disp: 30 tablet, Rfl: 3   traZODone  (DESYREL ) 100 MG tablet, Take 1 tablet (100 mg total) by mouth at bedtime., Disp: 90 tablet, Rfl: 1   Vitamin D , Ergocalciferol , (DRISDOL ) 1.25 MG (50000 UNIT) CAPS capsule, Take 1 capsule (50,000 Units total) by mouth every 7 (seven) days., Disp: 12 capsule, Rfl: 0  Allergies  Allergen Reactions   Penicillins Hives, Nausea And Vomiting and Rash    Has patient had a PCN reaction causing immediate rash, facial/tongue/throat swelling, SOB or lightheadedness with hypotension: no Has patient had a PCN reaction causing severe rash involving mucus membranes or skin necrosis: unknown Has patient had a PCN reaction that required hospitalization unknown Has patient had a PCN reaction occurring within the last 10 years: unknown If all of the above answers are NO, then may proceed with Cephalosporin use.  Has patient had a PCN reaction causing immediate rash, facial/tongue/throat swelling, SOB or lightheadedness with hypotension: no Has patient had a PCN reaction causing severe rash involving mucus membranes or skin necrosis: unknown Has patient had a PCN reaction that required hospitalization unknown Has patient had a PCN reaction occurring within the last 10 years: unknown If all of the  above answers are NO, then may proceed with Cephalosporin use. Has patient had a PCN reaction causing immediat    Objective:   Wt 289 lb (131.1 kg)   LMP 03/26/2024 (Exact Date)   BMI 49.61 kg/m  AAOx3, NAD NCAT, EOMI No obvious CN deficits Coloring WNL Pt is able to speak clearly, coherently without shortness of breath or increased work of breathing.  Thought  process is linear.  Mood is appropriate.   Assessment and Plan:   Anxiety/Depression- much improved since starting Sertraline .  She is smiling, looking forward to summer, overall feeling better.  No med changes at this time.  Continue Sertraline  50mg  daily and Wellbutrin .  Trazodone  nightly.  Pt expressed understanding and is in agreement w/ plan.    Laymon Priest, MD 03/27/2024

## 2024-03-30 MED ORDER — PROPOFOL 10 MG/ML IV BOLUS
INTRAVENOUS | Status: AC
  Filled 2024-03-30: qty 20

## 2024-03-30 MED ORDER — PROPOFOL 10 MG/ML IV BOLUS
INTRAVENOUS | Status: AC
Start: 2024-03-30 — End: 2024-03-30
  Filled 2024-03-30: qty 20

## 2024-04-01 ENCOUNTER — Encounter (HOSPITAL_COMMUNITY): Payer: Self-pay | Admitting: Obstetrics and Gynecology

## 2024-04-01 NOTE — Progress Notes (Signed)
 Spoke w/ via phone for pre-op interview--- Krystal Harris Lab needs dos----   CBC and UPT per surgeon.      Lab results------ COVID test -----patient states asymptomatic no test needed Arrive at -------0530 NPO after MN NO Solid Food.  Clear liquids from MN until--- 0430 Pre-Surgery Ensure or G2:  Med rec completed Medications to take morning of surgery -----Wellbutrin  and Zoloft . Diabetic medication -----  GLP1 agonist last dose: GLP1 instructions:  Patient instructed no nail polish to be worn day of surgery Patient instructed to bring photo id and insurance card day of surgery Patient aware to have Driver (ride ) / caregiver    for 24 hours after surgery - Friend Johanne Mutton Patient Special Instructions ----- Shower with antibacterial soap. Pre-Op special Instructions -----  Patient verbalized understanding of instructions that were given at this phone interview. Patient denies chest pain, sob, fever, cough at the interview.

## 2024-04-03 NOTE — Anesthesia Preprocedure Evaluation (Addendum)
 Anesthesia Evaluation  Patient identified by MRN, date of birth, ID band Patient awake    Reviewed: Allergy & Precautions, NPO status , Patient's Chart, lab work & pertinent test results  History of Anesthesia Complications (+) PONV and history of anesthetic complications  Airway Mallampati: I  TM Distance: >3 FB Neck ROM: Full    Dental no notable dental hx. (+) Teeth Intact, Dental Advisory Given   Pulmonary neg pulmonary ROS   Pulmonary exam normal breath sounds clear to auscultation       Cardiovascular negative cardio ROS Normal cardiovascular exam Rhythm:Regular Rate:Normal     Neuro/Psych  PSYCHIATRIC DISORDERS Anxiety Depression    negative neurological ROS     GI/Hepatic Neg liver ROS,GERD  ,,  Endo/Other    Class 4 obesity (BMI 50)  Renal/GU negative Renal ROS  negative genitourinary   Musculoskeletal  (+) Arthritis ,    Abdominal   Peds  Hematology negative hematology ROS (+)   Anesthesia Other Findings   Reproductive/Obstetrics                             Anesthesia Physical Anesthesia Plan  ASA: 3  Anesthesia Plan: General   Post-op Pain Management: Tylenol  PO (pre-op)* and Precedex    Induction: Intravenous  PONV Risk Score and Plan: 4 or greater and Ondansetron , Dexamethasone , Midazolam , TIVA and Scopolamine  patch - Pre-op  Airway Management Planned: LMA  Additional Equipment:   Intra-op Plan:   Post-operative Plan: Extubation in OR  Informed Consent: I have reviewed the patients History and Physical, chart, labs and discussed the procedure including the risks, benefits and alternatives for the proposed anesthesia with the patient or authorized representative who has indicated his/her understanding and acceptance.     Dental advisory given  Plan Discussed with: CRNA  Anesthesia Plan Comments:        Anesthesia Quick Evaluation

## 2024-04-04 ENCOUNTER — Encounter (HOSPITAL_COMMUNITY)
Admission: RE | Disposition: A | Discharge status: Home / Self Care | Payer: Self-pay | Source: Home / Self Care | Attending: Obstetrics and Gynecology

## 2024-04-04 ENCOUNTER — Ambulatory Visit (HOSPITAL_COMMUNITY): Payer: Self-pay | Admitting: Anesthesiology

## 2024-04-04 ENCOUNTER — Other Ambulatory Visit: Payer: Self-pay

## 2024-04-04 ENCOUNTER — Encounter (HOSPITAL_COMMUNITY): Payer: Self-pay | Admitting: Obstetrics and Gynecology

## 2024-04-04 ENCOUNTER — Ambulatory Visit (HOSPITAL_COMMUNITY)
Admission: RE | Admit: 2024-04-04 | Discharge: 2024-04-04 | Disposition: A | Discharge status: Home / Self Care | Attending: Obstetrics and Gynecology | Admitting: Obstetrics and Gynecology

## 2024-04-04 DIAGNOSIS — D250 Submucous leiomyoma of uterus: Secondary | ICD-10-CM

## 2024-04-04 DIAGNOSIS — Z6841 Body Mass Index (BMI) 40.0 and over, adult: Secondary | ICD-10-CM

## 2024-04-04 DIAGNOSIS — F418 Other specified anxiety disorders: Secondary | ICD-10-CM

## 2024-04-04 DIAGNOSIS — N920 Excessive and frequent menstruation with regular cycle: Secondary | ICD-10-CM | POA: Insufficient documentation

## 2024-04-04 DIAGNOSIS — K219 Gastro-esophageal reflux disease without esophagitis: Secondary | ICD-10-CM | POA: Diagnosis not present

## 2024-04-04 DIAGNOSIS — E6689 Other obesity not elsewhere classified: Secondary | ICD-10-CM | POA: Diagnosis not present

## 2024-04-04 LAB — CBC
HCT: 37.7 % (ref 36.0–46.0)
Hemoglobin: 11.9 g/dL — ABNORMAL LOW (ref 12.0–15.0)
MCH: 28 pg (ref 26.0–34.0)
MCHC: 31.6 g/dL (ref 30.0–36.0)
MCV: 88.7 fL (ref 80.0–100.0)
Platelets: 377 10*3/uL (ref 150–400)
RBC: 4.25 MIL/uL (ref 3.87–5.11)
RDW: 14.1 % (ref 11.5–15.5)
WBC: 7.5 10*3/uL (ref 4.0–10.5)
nRBC: 0 % (ref 0.0–0.2)

## 2024-04-04 LAB — POCT PREGNANCY, URINE: Preg Test, Ur: NEGATIVE

## 2024-04-04 SURGERY — HYSTEROSCOPY WITH MYOMECTOMY
Anesthesia: General | Site: Vagina

## 2024-04-04 MED ORDER — OXYCODONE HCL 5 MG/5ML PO SOLN: 5.0000 mg | Freq: Once | ORAL | Status: AC | PRN

## 2024-04-04 MED ORDER — SCOPOLAMINE 1 MG/3DAYS TD PT72
1.0000 | MEDICATED_PATCH | TRANSDERMAL | Status: DC
  Administered 2024-04-04: 1.5 mg via TRANSDERMAL

## 2024-04-04 MED ORDER — ONDANSETRON HCL 4 MG/2ML IJ SOLN
INTRAMUSCULAR | Status: AC
  Filled 2024-04-04: qty 2

## 2024-04-04 MED ORDER — FENTANYL CITRATE (PF) 100 MCG/2ML IJ SOLN
25.0000 ug | INTRAMUSCULAR | Status: DC | PRN
  Administered 2024-04-04: 50 ug via INTRAVENOUS
  Administered 2024-04-04: 25 ug via INTRAVENOUS
  Administered 2024-04-04: 50 ug via INTRAVENOUS
  Administered 2024-04-04: 25 ug via INTRAVENOUS

## 2024-04-04 MED ORDER — OXYCODONE HCL 5 MG PO TABS
5.0000 mg | ORAL_TABLET | Freq: Once | ORAL | Status: AC | PRN
  Administered 2024-04-04: 5 mg via ORAL

## 2024-04-04 MED ORDER — SCOPOLAMINE 1 MG/3DAYS TD PT72
MEDICATED_PATCH | TRANSDERMAL | Status: DC
Start: 2024-04-04 — End: 2024-04-04
  Filled 2024-04-04: qty 1

## 2024-04-04 MED ORDER — DEXAMETHASONE SODIUM PHOSPHATE 10 MG/ML IJ SOLN
INTRAMUSCULAR | Status: DC | PRN
  Administered 2024-04-04: 5 mg via INTRAVENOUS

## 2024-04-04 MED ORDER — FENTANYL CITRATE (PF) 100 MCG/2ML IJ SOLN
INTRAMUSCULAR | Status: AC
  Filled 2024-04-04: qty 2

## 2024-04-04 MED ORDER — ROCURONIUM BROMIDE 10 MG/ML (PF) SYRINGE
PREFILLED_SYRINGE | INTRAVENOUS | Status: DC | PRN
  Administered 2024-04-04: 10 mg via INTRAVENOUS
  Administered 2024-04-04: 50 mg via INTRAVENOUS
  Administered 2024-04-04: 10 mg via INTRAVENOUS

## 2024-04-04 MED ORDER — ESMOLOL HCL 100 MG/10ML IV SOLN
INTRAVENOUS | Status: AC
  Filled 2024-04-04: qty 10

## 2024-04-04 MED ORDER — CHLORHEXIDINE GLUCONATE 0.12 % MT SOLN
15.0000 mL | Freq: Once | OROMUCOSAL | Status: AC
  Administered 2024-04-04: 15 mL via OROMUCOSAL

## 2024-04-04 MED ORDER — KETOROLAC TROMETHAMINE 30 MG/ML IJ SOLN
INTRAMUSCULAR | Status: DC | PRN
  Administered 2024-04-04: 30 mg via INTRAVENOUS

## 2024-04-04 MED ORDER — KETAMINE HCL 50 MG/5ML IJ SOSY
PREFILLED_SYRINGE | INTRAMUSCULAR | Status: AC
  Filled 2024-04-04: qty 5

## 2024-04-04 MED ORDER — KETOROLAC TROMETHAMINE 30 MG/ML IJ SOLN
INTRAMUSCULAR | Status: AC
  Filled 2024-04-04: qty 1

## 2024-04-04 MED ORDER — POVIDONE-IODINE 10 % EX SWAB: 2.0000 | Freq: Once | CUTANEOUS | Status: DC

## 2024-04-04 MED ORDER — DEXMEDETOMIDINE HCL IN NACL 80 MCG/20ML IV SOLN
INTRAVENOUS | Status: AC
  Filled 2024-04-04: qty 20

## 2024-04-04 MED ORDER — SODIUM CHLORIDE (PF) 0.9 % IJ SOLN
INTRAMUSCULAR | Status: AC
  Filled 2024-04-04: qty 10

## 2024-04-04 MED ORDER — LACTATED RINGERS IV SOLN: INTRAVENOUS | Status: DC

## 2024-04-04 MED ORDER — ACETAMINOPHEN 500 MG PO TABS
ORAL_TABLET | ORAL | Status: DC
Start: 2024-04-04 — End: 2024-04-04
  Filled 2024-04-04: qty 2

## 2024-04-04 MED ORDER — OXYCODONE HCL 5 MG PO TABS
ORAL_TABLET | ORAL | Status: AC
  Filled 2024-04-04: qty 1

## 2024-04-04 MED ORDER — ROCURONIUM BROMIDE 10 MG/ML (PF) SYRINGE
PREFILLED_SYRINGE | INTRAVENOUS | Status: AC
Start: 2024-04-04 — End: 2024-04-04
  Filled 2024-04-04: qty 10

## 2024-04-04 MED ORDER — DEXAMETHASONE SODIUM PHOSPHATE 10 MG/ML IJ SOLN
INTRAMUSCULAR | Status: AC
Start: 2024-04-04 — End: 2024-04-04
  Filled 2024-04-04: qty 1

## 2024-04-04 MED ORDER — ALBUTEROL SULFATE HFA 108 (90 BASE) MCG/ACT IN AERS
INHALATION_SPRAY | RESPIRATORY_TRACT | Status: DC | PRN
Start: 2024-04-04 — End: 2024-04-04
  Administered 2024-04-04: 2 via RESPIRATORY_TRACT

## 2024-04-04 MED ORDER — SODIUM CHLORIDE (PF) 0.9 % IJ SOLN
INTRAMUSCULAR | Status: AC
  Filled 2024-04-04: qty 100

## 2024-04-04 MED ORDER — DEXMEDETOMIDINE HCL IN NACL 80 MCG/20ML IV SOLN
INTRAVENOUS | Status: DC | PRN
  Administered 2024-04-04 (×3): 8 ug via INTRAVENOUS

## 2024-04-04 MED ORDER — LIDOCAINE 2% (20 MG/ML) 5 ML SYRINGE
INTRAMUSCULAR | Status: AC
  Filled 2024-04-04: qty 5

## 2024-04-04 MED ORDER — ONDANSETRON HCL 4 MG/2ML IJ SOLN
INTRAMUSCULAR | Status: DC | PRN
  Administered 2024-04-04: 4 mg via INTRAVENOUS

## 2024-04-04 MED ORDER — AMISULPRIDE (ANTIEMETIC) 5 MG/2ML IV SOLN: 10.0000 mg | Freq: Once | INTRAVENOUS | Status: DC | PRN

## 2024-04-04 MED ORDER — LIDOCAINE 2% (20 MG/ML) 5 ML SYRINGE
INTRAMUSCULAR | Status: DC | PRN
  Administered 2024-04-04 (×2): 100 mg via INTRAVENOUS

## 2024-04-04 MED ORDER — MIDAZOLAM HCL 2 MG/2ML IJ SOLN
INTRAMUSCULAR | Status: AC
  Filled 2024-04-04: qty 2

## 2024-04-04 MED ORDER — IBUPROFEN 800 MG PO TABS
ORAL_TABLET | ORAL | Status: AC
  Filled 2024-04-04: qty 1

## 2024-04-04 MED ORDER — PROPOFOL 500 MG/50ML IV EMUL
INTRAVENOUS | Status: DC | PRN
  Administered 2024-04-04: 175 ug/kg/min via INTRAVENOUS
  Administered 2024-04-04: 200 mg via INTRAVENOUS

## 2024-04-04 MED ORDER — LABETALOL HCL 5 MG/ML IV SOLN: INTRAVENOUS | Status: DC | PRN

## 2024-04-04 MED ORDER — FENTANYL CITRATE (PF) 100 MCG/2ML IJ SOLN
INTRAMUSCULAR | Status: DC | PRN
  Administered 2024-04-04: 100 ug via INTRAVENOUS

## 2024-04-04 MED ORDER — VASOPRESSIN 20 UNIT/ML IV SOLN
INTRAVENOUS | Status: AC
  Filled 2024-04-04: qty 1

## 2024-04-04 MED ORDER — PHENYLEPHRINE 80 MCG/ML (10ML) SYRINGE FOR IV PUSH (FOR BLOOD PRESSURE SUPPORT)
PREFILLED_SYRINGE | INTRAVENOUS | Status: AC
  Filled 2024-04-04: qty 10

## 2024-04-04 MED ORDER — CHLORHEXIDINE GLUCONATE 0.12 % MT SOLN
OROMUCOSAL | Status: AC
  Filled 2024-04-04: qty 15

## 2024-04-04 MED ORDER — FENTANYL CITRATE (PF) 100 MCG/2ML IJ SOLN
INTRAMUSCULAR | Status: AC
Start: 2024-04-04 — End: 2024-04-04
  Filled 2024-04-04: qty 2

## 2024-04-04 MED ORDER — VASOPRESSIN 20 UNIT/ML IV SOLN
INTRAVENOUS | Status: DC | PRN
  Administered 2024-04-04: 20 mL via INTRAMUSCULAR

## 2024-04-04 MED ORDER — ACETAMINOPHEN 500 MG PO TABS
1000.0000 mg | ORAL_TABLET | Freq: Once | ORAL | Status: AC
  Administered 2024-04-04: 1000 mg via ORAL

## 2024-04-04 MED ORDER — IBUPROFEN 800 MG PO TABS: 800.0000 mg | ORAL_TABLET | Freq: Three times a day (TID) | ORAL | 4 refills | Status: AC | PRN

## 2024-04-04 MED ORDER — KETAMINE HCL 50 MG/5ML IJ SOSY
PREFILLED_SYRINGE | INTRAMUSCULAR | Status: DC | PRN
  Administered 2024-04-04: 30 mg via INTRAVENOUS

## 2024-04-04 MED ORDER — ORAL CARE MOUTH RINSE: 15.0000 mL | Freq: Once | OROMUCOSAL | Status: AC

## 2024-04-04 MED ORDER — SUGAMMADEX SODIUM 200 MG/2ML IV SOLN
INTRAVENOUS | Status: DC | PRN
  Administered 2024-04-04: 200 mg via INTRAVENOUS

## 2024-04-04 MED ORDER — MIDAZOLAM HCL 2 MG/2ML IJ SOLN
INTRAMUSCULAR | Status: DC | PRN
  Administered 2024-04-04: 2 mg via INTRAVENOUS

## 2024-04-04 MED ORDER — ESMOLOL HCL 100 MG/10ML IV SOLN
INTRAVENOUS | Status: DC | PRN
  Administered 2024-04-04: 30 mg via INTRAVENOUS

## 2024-04-04 MED ORDER — SODIUM CHLORIDE 0.9 % IR SOLN
Status: DC | PRN
  Administered 2024-04-04: 3000 mL

## 2024-04-04 MED ORDER — PROPOFOL 10 MG/ML IV BOLUS
INTRAVENOUS | Status: AC
Start: 2024-04-04 — End: 2024-04-04
  Filled 2024-04-04: qty 20

## 2024-04-04 SURGICAL SUPPLY — 13 items
CATH ROBINSON RED A/P 16FR (CATHETERS) ×2 IMPLANT
DEVICE MYOSURE LITE (MISCELLANEOUS) IMPLANT
DEVICE MYOSURE REACH (MISCELLANEOUS) IMPLANT
GLOVE ECLIPSE 6.5 STRL STRAW (GLOVE) ×2 IMPLANT
GLOVE SURG UNDER POLY LF SZ7 (GLOVE) ×4 IMPLANT
GOWN STRL REUS W/ TWL LRG LVL3 (GOWN DISPOSABLE) ×4 IMPLANT
KIT PROCEDURE FLUENT (KITS) ×2 IMPLANT
KIT TURNOVER KIT B (KITS) ×2 IMPLANT
PACK VAGINAL MINOR WOMEN LF (CUSTOM PROCEDURE TRAY) ×2 IMPLANT
SEAL ROD LENS SCOPE MYOSURE (ABLATOR) ×2 IMPLANT
SYSTEM TISS REMOVAL MYOSURE XL (MISCELLANEOUS) IMPLANT
TOWEL GREEN STERILE FF (TOWEL DISPOSABLE) ×2 IMPLANT
UNDERPAD 30X36 HEAVY ABSORB (UNDERPADS AND DIAPERS) ×2 IMPLANT

## 2024-04-04 NOTE — Op Note (Unsigned)
 Krystal Harris, NAULT MEDICAL RECORD NO: [id number] ACCOUNT NO: [id number] DATE OF BIRTH: [birth year] FACILITY: [facility] LOCATION: [facility] PHYSICIAN: Aubree Doody A. Lesta Rater, MD  Operative Report   DATE OF PROCEDURE: 04/04/2024  PREOPERATIVE DIAGNOSES: 1.  Menorrhagia. 2.  Submucosal fibroids.  PROCEDURE: 1.  Diagnostic hysteroscopy. 2.  Hysteroscopic resection of submucosal fibroids using MyoSure. 3.  Dilation and curettage.  POSTOPERATIVE DIAGNOSES: 1.  Menorrhagia. 2.  Submucosal fibroids.  ANESTHESIA:  General.  SURGEON:  Ivery Marking, MD.  ASSISTANT:  None.  DESCRIPTION OF PROCEDURE:  Under adequate general anesthesia, the patient was placed in the dorsal lithotomy position.  The patient was sterilely prepped and draped in a usual fashion.  The bladder was catheterized for a moderate amount of urine.  A  bivalve speculum was placed in the vagina.  A single-tooth tenaculum was placed on the anterior lip of the cervix.  The cervix was serially dilated up to a #19 Pratt dilator.  A diagnostic hysteroscope was introduced into the uterine cavity.  A distorted  uterine cavity was noted.  The left tubal ostia could be seen.  The right was not easily seen.  Of note was a fundal submucosal fibroid and a left lateral large submucosal fibroid.  Using the REACH resectoscope, the fundal fibroid was entirely resected.   The resectoscope was removed.  A Wolf #22-gauge needle was injected with a dilute solution of 20 in a 100 of Pitressin into the large left lateral fibroid in multiple locations, a total of 20 mL was used.  The needle was removed.  The REACH XL  resectoscope was then inserted, and the entire lateral submucosal fibroid was resected.  Once that was done, the endometrial cavity was also resected.  At that point, the procedure was felt to be complete.  The endocervical canal was without any lesions.   All instruments were then removed from the vagina.  SPECIMEN:  Labeled  endometrial curetting with fibroid resection was sent to pathology.  ESTIMATED BLOOD LOSS:  Probably 15 mL.  FLUID DEFICIT:  1130.  INTRAOPERATIVE FLUIDS:  1 liter.  COMPLICATIONS:  None.  The patient tolerated the procedure well and was transferred to the recovery room in stable condition.   PUS D: 04/04/2024 9:17:31 am T: 04/04/2024 4:24:00 pm  JOB: [id number]/ [id number]

## 2024-04-04 NOTE — Brief Op Note (Signed)
 04/04/2024  9:10 AM  PATIENT:  Krystal Harris  38 y.o. female  PRE-OPERATIVE DIAGNOSIS:  Menorrhagia with regular cycle, submucosal fribroid  POST-OPERATIVE DIAGNOSIS:  Menorrhagia with regular cycle, submucosal fribroid  PROCEDURE:  diagnostic hysteroscopy, hysteroscopic resection of submucosal fibroids using myosure, dilation and curettage  SURGEON:  Surgeons and Role:    * Ivery Marking, MD - Primary  PHYSICIAN ASSISTANT:   ASSISTANTS: none   ANESTHESIA:   general Findings: fundal submucosal fibroid, distorted left side withlarge lateral SM fibroid. Left tubal os seen , right not seen EBL:  15ml   BLOOD ADMINISTERED:none  DRAINS: none   LOCAL MEDICATIONS USED:  NONE  SPECIMEN:  Source of Specimen:  EMC with fibroid resections  DISPOSITION OF SPECIMEN:  PATHOLOGY  COUNTS:  YES  TOURNIQUET:  * No tourniquets in log *  DICTATION: .Other Dictation: Dictation Number [id number]  PLAN OF CARE: Discharge to home after PACU  PATIENT DISPOSITION:  PACU - hemodynamically stable.   Delay start of Pharmacological VTE agent (>24hrs) due to surgical blood loss or risk of bleeding: no

## 2024-04-04 NOTE — Transfer of Care (Signed)
 Immediate Anesthesia Transfer of Care Note  Patient: Krystal Harris  Procedure(s) Performed: HYSTEROSCOPY WITH MYOMECTOMY USING MYOSURE (Vagina ) DILATATION AND CURETTAGE /HYSTEROSCOPY (Vagina )  Patient Location: PACU  Anesthesia Type:General  Level of Consciousness: oriented, drowsy, and patient cooperative  Airway & Oxygen Therapy: Patient Spontanous Breathing and Patient connected to nasal cannula oxygen  Post-op Assessment: Report given to RN, Post -op Vital signs reviewed and stable, Patient moving all extremities X 4, and Patient able to stick tongue midline  Post vital signs: Reviewed and stable  Last Vitals:  Vitals Value Taken Time  BP 127/94 04/04/24 09:12  Temp 97.7   Pulse 99 04/04/24 09:12  Resp 29 04/04/24 09:12  SpO2 96 % 04/04/24 09:12  Vitals shown include unfiled device data.  Last Pain:  Vitals:   04/04/24 0645  TempSrc: Oral  PainSc: 3       Patients Stated Pain Goal: 3 (04/04/24 0645)  Complications: No notable events documented.

## 2024-04-04 NOTE — Anesthesia Postprocedure Evaluation (Signed)
 Anesthesia Post Note  Patient: Krystal Harris  Procedure(s) Performed: HYSTEROSCOPY WITH MYOMECTOMY USING MYOSURE (Vagina ) DILATATION AND CURETTAGE /HYSTEROSCOPY (Vagina )     Patient location during evaluation: PACU Anesthesia Type: General Level of consciousness: awake and alert Pain management: pain level controlled Vital Signs Assessment: post-procedure vital signs reviewed and stable Respiratory status: spontaneous breathing, nonlabored ventilation, respiratory function stable and patient connected to nasal cannula oxygen Cardiovascular status: blood pressure returned to baseline and stable Postop Assessment: no apparent nausea or vomiting Anesthetic complications: no  No notable events documented.  Last Vitals:  Vitals:   04/04/24 1000 04/04/24 1030  BP: (!) 140/90 (!) 140/87  Pulse:    Resp:  18  Temp:  36.6 C  SpO2:  95%    Last Pain:  Vitals:   04/04/24 1000  TempSrc:   PainSc: Asleep                 Jamell Opfer L Maks Cavallero

## 2024-04-04 NOTE — Discharge Instructions (Addendum)
 CALL  IF TEMP>100.4, NOTHING PER VAGINA X 2 WK, CALL IF SOAKING A MAXI  PAD EVERY HOUR OR MORE FREQUENTLY Post Anesthesia Home Care Instructions  Activity: Get plenty of rest for the remainder of the day. A responsible individual must stay with you for 24 hours following the procedure.  For the next 24 hours, DO NOT: -Drive a car -Advertising copywriter -Drink alcoholic beverages -Take any medication unless instructed by your physician -Make any legal decisions or sign important papers.  Meals: Start with liquid foods such as gelatin or soup. Progress to regular foods as tolerated. Avoid greasy, spicy, heavy foods. If nausea and/or vomiting occur, drink only clear liquids until the nausea and/or vomiting subsides. Call your physician if vomiting continues.  Special Instructions/Symptoms: Your throat may feel dry or sore from the anesthesia or the breathing tube placed in your throat during surgery. If this causes discomfort, gargle with warm salt water . The discomfort should disappear within 24 hours.  If you had a scopolamine  patch placed behind your ear for the management of post- operative nausea and/or vomiting:  1. The medication in the patch is effective for 72 hours, after which it should be removed.  Wrap patch in a tissue and discard in the trash. Wash hands thoroughly with soap and water . 2. You may remove the patch earlier than 72 hours if you experience unpleasant side effects which may include dry mouth, dizziness or visual disturbances. 3. Avoid touching the patch. Wash your hands with soap and water  after contact with the patch.      D & C Home care Instructions:   Personal hygiene:  Used sanitary napkins for vaginal drainage not tampons. Shower or tub bathe the day after your procedure. No douching until bleeding stops. Always wipe from front to back after  Elimination.  Activity: Do not drive or operate any equipment today. The effects of the anesthesia are still present  and drowsiness may result. Rest today, not necessarily flat bed rest, just take it easy. You may resume your normal activity in one to 2 days.  Sexual activity: No intercourse for one week or as indicated by your physician  Diet: Eat a light diet as desired this evening. You may resume a regular diet tomorrow.  Return to work: One to 2 days.  General Expectations of your surgery: Vaginal bleeding should be no heavier than a normal period. Spotting may continue up to 10 days. Mild cramps may continue for a couple of days. You may have a regular period in 2-6 weeks.  Unexpected observations call your doctor if these occur: persistent or heavy bleeding. Severe abdominal cramping or pain. Elevation of temperature greater than 100F.  Call for an appointment in one week.

## 2024-04-04 NOTE — Anesthesia Procedure Notes (Signed)
 Procedure Name: Intubation Date/Time: 04/04/2024 7:59 AM  Performed by: Trenton Frock, CRNAPre-anesthesia Checklist: Patient identified, Emergency Drugs available, Suction available and Patient being monitored Patient Re-evaluated:Patient Re-evaluated prior to induction Oxygen Delivery Method: Circle system utilized Preoxygenation: Pre-oxygenation with 100% oxygen Induction Type: IV induction Ventilation: Mask ventilation without difficulty Laryngoscope Size: Mac and 3 Grade View: Grade I Tube type: Oral Tube size: 7.0 mm Number of attempts: 1 Airway Equipment and Method: Stylet and Bite block Placement Confirmation: ETT inserted through vocal cords under direct vision, positive ETCO2 and breath sounds checked- equal and bilateral Secured at: 22 cm Tube secured with: Tape Dental Injury: Teeth and Oropharynx as per pre-operative assessment

## 2024-04-04 NOTE — Interval H&P Note (Signed)
 History and Physical Interval Note:  04/04/2024 7:14 AM  Krystal Harris  has presented today for surgery, with the diagnosis of Menorrhagia with regular cycle, submucosal fribroid.  The various methods of treatment have been discussed with the patient and family. After consideration of risks, benefits and other options for treatment, the patient has consented to  diagnotic hysteroscopy, hysteroscopic resection of submucosal fibroids using myosure, dilation and curettage as a surgical intervention.  The patient's history has been reviewed, patient examined, no change in status, stable for surgery.  I have reviewed the patient's chart and labs.  Questions were answered to the patient's satisfaction.     Jacquiline Zurcher A Akansha Wyche

## 2024-04-04 NOTE — H&P (Signed)
 Krystal Harris is an 38 y.o. female. Presents for dx hysteroscopy, D&C, resection of SM fibroid using myosure due to menorrhagia, multiple SM fibroids  Pertinent Gynecological History: Menses: menorrhagia Bleeding: menorrhagia Contraception: none DES exposure: denies Blood transfusions: none Sexually transmitted diseases: no past history Previous GYN Procedures: DNC  Last mammogram: n/a Date: n/a Last pap: normal Date: 2024 OB History: G0, P0   Menstrual History: Menarche age: n/a Patient's last menstrual period was 02/22/2024 (approximate).    Past Medical History:  Diagnosis Date   Anemia    Anxiety    Chicken pox    Depression    Gallbladder problem    GERD (gastroesophageal reflux disease)    PONV (postoperative nausea and vomiting)    vomiting after surgery gallbladder removal 2016   Vitamin D  deficiency     Past Surgical History:  Procedure Laterality Date   BRAVO PH STUDY N/A 01/22/2020   Procedure: BRAVO PH STUDY;  Surgeon: Janel Medford, MD;  Location: [facility];  Service: Endoscopy;  Laterality: N/A;   CHOLECYSTECTOMY     laprascopic   CYST EXCISION Right 12/29/2016   Procedure: EXCISION OF RIGHT AXILLARY CYST;  Surgeon: Shela Derby, MD;  Location: [facility];  Service: General;  Laterality: Right;   DILATATION & CURETTAGE/HYSTEROSCOPY WITH MYOSURE N/A 05/05/2022   Procedure: DILATATION & CURETTAGE/HYSTEROSCOPY WITH MYOSURE;  Surgeon: Ivery Marking, MD;  Location: [facility];  Service: Gynecology;  Laterality: N/A;   ESOPHAGOGASTRODUODENOSCOPY N/A 08/04/2020   Procedure: ESOPHAGOGASTRODUODENOSCOPY (EGD) with balloon dilation;  Surgeon: Shela Derby, MD;  Location: [facility];  Service: General;  Laterality: N/A;   ESOPHAGOGASTRODUODENOSCOPY (EGD) WITH PROPOFOL  N/A 01/22/2020   Procedure: ESOPHAGOGASTRODUODENOSCOPY (EGD) WITH PROPOFOL ;  Surgeon: Janel Medford, MD;  Location: [facility];  Service: Endoscopy;  Laterality: N/A;   FOOT  SURGERY  02/2015   scar tissue and keloid scar removal   FOREIGN BODY REMOVAL Right 09/17/2013   Procedure: REMOVAL FOREIGN BODY NEEDLE PLANTAR ASPECT OF THE FOOT;  Surgeon: Timothy Ford, MD;  Location: [facility];  Service: Orthopedics;  Laterality: Right;  Remove Foreign Body Right Foot   LAPAROSCOPIC NISSEN FUNDOPLICATION  06/01/2020   WISDOM TOOTH EXTRACTION      Family History  Problem Relation Age of Onset   Diabetes Mother    Hypertension Mother    Thyroid  disease Mother    Obesity Mother    Stroke Maternal Grandmother    Diabetes Maternal Grandfather    Stroke Maternal Grandfather    Hypertension Maternal Grandfather    Lung cancer Maternal Grandfather    Stomach cancer Neg Hx    Colon cancer Neg Hx    Rectal cancer Neg Hx    Pancreatic cancer Neg Hx     Social History:  reports that she has never smoked. She has never used smokeless tobacco. She reports current alcohol use. She reports that she does not use drugs.  Allergies:  Allergies  Allergen Reactions   Penicillins Hives, Nausea And Vomiting and Rash    Has patient had a PCN reaction causing immediate rash, facial/tongue/throat swelling, SOB or lightheadedness with hypotension: no Has patient had a PCN reaction causing severe rash involving mucus membranes or skin necrosis: unknown Has patient had a PCN reaction that required hospitalization unknown Has patient had a PCN reaction occurring within the last 10 years: unknown If all of the above answers are NO, then may proceed with Cephalosporin use.  Has patient had a PCN reaction causing immediate rash,  facial/tongue/throat swelling, SOB or lightheadedness with hypotension: no Has patient had a PCN reaction causing severe rash involving mucus membranes or skin necrosis: unknown Has patient had a PCN reaction that required hospitalization unknown Has patient had a PCN reaction occurring within the last 10 years: unknown If all of the above answers are NO, then may  proceed with Cephalosporin use. Has patient had a PCN reaction causing immediat    No medications prior to admission.    Review of Systems  All other systems reviewed and are negative.   Last menstrual period 02/22/2024. Physical Exam Constitutional:      Appearance: Normal appearance.  HENT:     Head: Atraumatic.   Eyes:     Extraocular Movements: Extraocular movements intact.    Cardiovascular:     Rate and Rhythm: Regular rhythm.     Heart sounds: Normal heart sounds.  Pulmonary:     Breath sounds: Normal breath sounds.  Abdominal:     Palpations: Abdomen is soft.  Genitourinary:    General: Normal vulva.     Comments: Vagina; nl Cervix nulliparous Uterus irreg enlarged AV Adnexa nl  Musculoskeletal:        General: Normal range of motion.     Cervical back: Neck supple.   Skin:    General: Skin is warm and dry.   Neurological:     Mental Status: She is alert and oriented to person, place, and time.   Psychiatric:        Mood and Affect: Mood normal.        Behavior: Behavior normal.     No results found for this or any previous visit (from the past 24 hours).  No results found.  Assessment/Plan: Menorrhagia Submucosal fibroids P) dx hysteroscopy, hysteroscopic resection of SM fibroids using myosure. Procedure explained. Risk of surgery reviewed including infection, bleeding, possible need for blood transfusion and its risk, thermal injury, inability to complete resection in one setting, fluid overload and its mgmt, uterine perforation ( 10/998) and its risk, injury to surrounding organ structures. All ? answered  Milton Sagona A Sadao Weyer 04/04/2024, 12:24 AM

## 2024-04-05 ENCOUNTER — Encounter (HOSPITAL_COMMUNITY): Payer: Self-pay | Admitting: Obstetrics and Gynecology

## 2024-04-07 ENCOUNTER — Encounter: Payer: Self-pay | Admitting: Family

## 2024-04-07 ENCOUNTER — Ambulatory Visit: Admitting: Family Medicine

## 2024-04-07 ENCOUNTER — Ambulatory Visit: Admitting: Family

## 2024-04-07 VITALS — BP 127/85 | HR 102 | Temp 98.2°F | Ht 64.0 in | Wt 295.0 lb

## 2024-04-07 DIAGNOSIS — B370 Candidal stomatitis: Secondary | ICD-10-CM | POA: Diagnosis not present

## 2024-04-07 DIAGNOSIS — J029 Acute pharyngitis, unspecified: Secondary | ICD-10-CM | POA: Diagnosis not present

## 2024-04-07 LAB — SURGICAL PATHOLOGY

## 2024-04-07 LAB — POCT RAPID STREP A (OFFICE): Rapid Strep A Screen: NEGATIVE

## 2024-04-07 MED ORDER — NYSTATIN 100000 UNIT/ML MT SUSP: 5.0000 mL | Freq: Three times a day (TID) | OROMUCOSAL | 0 refills | Status: DC | PRN

## 2024-04-07 NOTE — Progress Notes (Signed)
 Patient ID: Krystal Harris, female    DOB: 1986/01/13, 38 y.o.   MRN: [id number]  Chief Complaint  Patient presents with   Sore Throat    Had surgery on Friday and was told from breathing tube I would only have discomfort for 24hrs but its been 3 days; still very sore, can only eat soft foods, and coughing up blood  Discussed the use of AI scribe software for clinical note transcription with the patient, who gave verbal consent to proceed.  History of Present Illness Krystal Harris is a 38 year old female who presents with a sore throat and hemoptysis following a recent surgical procedure.  She underwent fibroid removal and a dilation and curettage (D&C) on Friday. Postoperatively, she was informed she might experience a sore throat for 24 hours, but her throat pain is more severe than expected. The throat pain is constant, worsens with swallowing, and is accompanied by a sensation of an extended uvula causing frequent gagging. She manages the pain with ibuprofen  800 mg every eight hours, Tylenol , warm salt water  gargles, soft foods, and hot tea. She reports coughing up small amounts of blood, which she notices in tissues after clearing her throat. This symptom persists without improvement. She avoids harder foods like bacon due to significant discomfort. She has not been on antibiotics and has no history of thrush. She was given a mouthwash to gargle with before the surgery. She is experiencing vaginal bleeding post-surgery, which she was informed could last up to ten days.  Assessment & Plan Postoperative sore throat - Persistent sore throat post-surgery. Pain poorly managed with ibuprofen , acetaminophen , and salt water  gargles. No impact on breathing, but pain with consumption of liquids and soft foods. Throat with erythema and swollen tonsils, oral thrush on right posterior throat and tongue noted, rapid strep negative. Advised that thrush can cause a sore throat, possibly worsening her pain  from the ET tube, and sending thrush medication, but she feels her pain is more related to the ET tube and concerned about any damage. Offered urgent ENT referral, prefers to hold off for now.  - Continue ibuprofen  and acetaminophen  as directed. - Sending Magic mouthwash, gargle for 1 minute, swallow or spit, advised on possible SE. - Advised on importance of hydration and continued soft food consumption. - Call back if throat pain is still not improved by end of the week.   Subjective:    Outpatient Medications Prior to Visit  Medication Sig Dispense Refill   buPROPion  (WELLBUTRIN  XL) 300 MG 24 hr tablet TAKE 1 TABLET BY MOUTH EVERY DAY 90 tablet 3   busPIRone  (BUSPAR ) 15 MG tablet Take 1 tablet (15 mg total) by mouth 2 (two) times daily. 60 tablet 3   Cholecalciferol (VITAMIN D3 PO) Take 3,000 Units by mouth daily at 12 noon.      ibuprofen  (ADVIL ) 800 MG tablet Take 1 tablet (800 mg total) by mouth every 8 (eight) hours as needed for moderate pain (pain score 4-6). 30 tablet 4   Prenatal Vit-Fe Fumarate-FA (PRENATAL MULTIVITAMIN) TABS tablet Take 1 tablet by mouth daily at 12 noon.     sertraline  (ZOLOFT ) 50 MG tablet Take 1 tablet (50 mg total) by mouth daily. 30 tablet 3   traZODone  (DESYREL ) 100 MG tablet Take 1 tablet (100 mg total) by mouth at bedtime. 90 tablet 1   Vitamin D , Ergocalciferol , (DRISDOL ) 1.25 MG (50000 UNIT) CAPS capsule Take 1 capsule (50,000 Units total) by mouth every 7 (seven)  days. 12 capsule 0   No facility-administered medications prior to visit.   Past Medical History:  Diagnosis Date   Anemia    Anxiety    Chicken pox    Depression    Gallbladder problem    GERD (gastroesophageal reflux disease)    PONV (postoperative nausea and vomiting)    vomiting after surgery gallbladder removal 2016   Vitamin D  deficiency    Past Surgical History:  Procedure Laterality Date   BRAVO PH STUDY N/A 01/22/2020   Procedure: BRAVO PH STUDY;  Surgeon: Teressa Toribio SQUIBB,  MD;  Location: [facility];  Service: Endoscopy;  Laterality: N/A;   CHOLECYSTECTOMY     laprascopic   CYST EXCISION Right 12/29/2016   Procedure: EXCISION OF RIGHT AXILLARY CYST;  Surgeon: Lynda Leos, MD;  Location: [facility];  Service: General;  Laterality: Right;   DILATATION & CURETTAGE/HYSTEROSCOPY WITH MYOSURE N/A 05/05/2022   Procedure: DILATATION & CURETTAGE/HYSTEROSCOPY WITH MYOSURE;  Surgeon: Rutherford Gain, MD;  Location: [facility];  Service: Gynecology;  Laterality: N/A;   ESOPHAGOGASTRODUODENOSCOPY N/A 08/04/2020   Procedure: ESOPHAGOGASTRODUODENOSCOPY (EGD) with balloon dilation;  Surgeon: Leos Lynda, MD;  Location: [facility];  Service: General;  Laterality: N/A;   ESOPHAGOGASTRODUODENOSCOPY (EGD) WITH PROPOFOL  N/A 01/22/2020   Procedure: ESOPHAGOGASTRODUODENOSCOPY (EGD) WITH PROPOFOL ;  Surgeon: Teressa Toribio SQUIBB, MD;  Location: [facility];  Service: Endoscopy;  Laterality: N/A;   FOOT SURGERY  02/2015   scar tissue and keloid scar removal   FOREIGN BODY REMOVAL Right 09/17/2013   Procedure: REMOVAL FOREIGN BODY NEEDLE PLANTAR ASPECT OF THE FOOT;  Surgeon: Jerona LULLA Sage, MD;  Location: [facility];  Service: Orthopedics;  Laterality: Right;  Remove Foreign Body Right Foot   HYSTEROSCOPY WITH D & C  04/04/2024   Procedure: DILATATION AND CURETTAGE /HYSTEROSCOPY;  Surgeon: Rutherford Gain, MD;  Location: [facility];  Service: Gynecology;;   HYSTEROSCOPY WITH MYOMECTOMY N/A 04/04/2024   Procedure: HYSTEROSCOPY WITH MYOMECTOMY USING MYOSURE;  Surgeon: Rutherford Gain, MD;  Location: [facility];  Service: Gynecology;  Laterality: N/A;  confirmed on 6/16 rep will cover case CS   LAPAROSCOPIC NISSEN FUNDOPLICATION  06/01/2020   WISDOM TOOTH EXTRACTION     Allergies  Allergen Reactions   Penicillins Hives, Nausea And Vomiting and Rash    Has patient had a PCN reaction causing immediate rash, facial/tongue/throat swelling, SOB or lightheadedness with hypotension: no Has  patient had a PCN reaction causing severe rash involving mucus membranes or skin necrosis: unknown Has patient had a PCN reaction that required hospitalization unknown Has patient had a PCN reaction occurring within the last 10 years: unknown If all of the above answers are NO, then may proceed with Cephalosporin use.  Has patient had a PCN reaction causing immediate rash, facial/tongue/throat swelling, SOB or lightheadedness with hypotension: no Has patient had a PCN reaction causing severe rash involving mucus membranes or skin necrosis: unknown Has patient had a PCN reaction that required hospitalization unknown Has patient had a PCN reaction occurring within the last 10 years: unknown If all of the above answers are NO, then may proceed with Cephalosporin use. Has patient had a PCN reaction causing immediat      Objective:    Physical Exam Vitals and nursing note reviewed.  Constitutional:      Appearance: Normal appearance. She is obese.  HENT:     Mouth/Throat:     Mouth: No injury.     Tongue: Lesions (white patches noted, not thick) present.  Palate: No mass and lesions.     Pharynx: Uvula midline. Oropharyngeal exudate (right side) and posterior oropharyngeal erythema present. No uvula swelling or postnasal drip.     Tonsils: 2+ on the right. 2+ on the left.   Cardiovascular:     Rate and Rhythm: Normal rate and regular rhythm.  Pulmonary:     Effort: Pulmonary effort is normal.     Breath sounds: Normal breath sounds.   Musculoskeletal:        General: Normal range of motion.   Skin:    General: Skin is warm and dry.   Neurological:     Mental Status: She is alert.   Psychiatric:        Mood and Affect: Mood normal.        Behavior: Behavior normal.    BP 127/85   Pulse (!) 102   Temp 98.2 F (36.8 C)   Ht 5' 4 (1.626 m)   Wt 295 lb (133.8 kg)   LMP 03/27/2024 (Approximate)   SpO2 98%   BMI 50.64 kg/m  Wt Readings from Last 3 Encounters:   04/07/24 295 lb (133.8 kg)  04/04/24 289 lb (131.1 kg)  03/27/24 289 lb (131.1 kg)      Lucius Krabbe, NP

## 2024-04-08 DIAGNOSIS — Z6841 Body Mass Index (BMI) 40.0 and over, adult: Secondary | ICD-10-CM | POA: Diagnosis not present

## 2024-04-08 DIAGNOSIS — D250 Submucous leiomyoma of uterus: Secondary | ICD-10-CM | POA: Diagnosis not present

## 2024-04-08 DIAGNOSIS — F418 Other specified anxiety disorders: Secondary | ICD-10-CM | POA: Diagnosis not present

## 2024-05-23 ENCOUNTER — Inpatient Hospital Stay: Attending: Hematology and Oncology

## 2024-06-13 ENCOUNTER — Other Ambulatory Visit: Payer: Self-pay | Admitting: Family Medicine

## 2024-07-23 ENCOUNTER — Ambulatory Visit: Payer: Self-pay

## 2024-07-23 NOTE — Telephone Encounter (Signed)
 FYI Only or Action Required?: FYI only for provider.  Patient was last seen in primary care on 04/07/2024 by Lucius Krabbe, NP.  Called Nurse Triage reporting Anxiety.  Triage Disposition: See PCP Within 2 Weeks  Patient/caregiver understands and will follow disposition?: Yes      Copied from CRM [id number]. Topic: Clinical - Red Word Triage >> Jul 23, 2024  2:46 PM [facility] ORN wrote: Red Word that prompted transfer to Nurse Triage: Increased anxiety. Depression. Pt would like to see PCP this Friday      Reason for Disposition  [1] Symptoms of anxiety or panic attack AND [2] is a chronic symptom (recurrent or ongoing AND present > 4 weeks)  Answer Assessment - Initial Assessment Questions Patient calling in for her anxiety. Patient declined answering any triage questions and insisted on an appointment with Dr. Mahlon. Video appointment scheduled for 10/16 per her request.  Protocols used: Anxiety and Panic Attack-A-AH

## 2024-07-23 NOTE — Telephone Encounter (Signed)
 Called patient about being seen sooner, nothing Friday but we can get her in on Monday

## 2024-07-29 ENCOUNTER — Ambulatory Visit: Admitting: Student in an Organized Health Care Education/Training Program

## 2024-07-31 ENCOUNTER — Telehealth: Admitting: Family Medicine

## 2024-07-31 ENCOUNTER — Encounter: Payer: Self-pay | Admitting: Family Medicine

## 2024-07-31 ENCOUNTER — Ambulatory Visit: Admitting: Family Medicine

## 2024-07-31 DIAGNOSIS — F32A Depression, unspecified: Secondary | ICD-10-CM

## 2024-07-31 DIAGNOSIS — F419 Anxiety disorder, unspecified: Secondary | ICD-10-CM

## 2024-07-31 MED ORDER — BUPROPION HCL ER (XL) 150 MG PO TB24: 150.0000 mg | ORAL_TABLET | Freq: Every day | ORAL | 3 refills | Status: DC

## 2024-07-31 NOTE — Progress Notes (Signed)
 Virtual Visit via Video   I connected with patient on 07/31/24 at 10:20 AM EDT by a video enabled telemedicine application and verified that I am speaking with the correct person using two identifiers.  Location patient: Home Location provider: [facility], Office Persons participating in the virtual visit: Patient, Provider, CMA (Tonya H)  I discussed the limitations of evaluation and management by telemedicine and the availability of in person appointments. The patient expressed understanding and agreed to proceed.  Subjective:   HPI:   Anxiety/Depression- pt reports she has been off medication.  Currently working in 1st grade, doing principal internship at the same time.  Reports she lost her Wellbutrin  and has been off medication for a few weeks.  Currently on Sertraline  and Trazodone .    ROS:   See pertinent positives and negatives per HPI.  Patient Active Problem List   Diagnosis Date Noted   Iron  deficiency anemia 09/12/2021   Vitamin D  deficiency 09/12/2021   Status post robot-assisted surgical procedure 06/01/2020   S/P Nissen fundoplication (without gastrostomy tube) procedure 06/01/2020   GERD (gastroesophageal reflux disease) 05/01/2019   Knee osteoarthritis 09/12/2017   Morbid obesity (HCC) 08/13/2017   Anxiety and depression 04/11/2017   Insomnia 04/11/2017   Fatty liver 09/27/2016   Physical exam 02/21/2013    Social History   Tobacco Use   Smoking status: Never   Smokeless tobacco: Never  Substance Use Topics   Alcohol use: Yes    Comment: occasionally    Current Outpatient Medications:    Cholecalciferol (VITAMIN D3 PO), Take 3,000 Units by mouth daily at 12 noon. , Disp: , Rfl:    ibuprofen  (ADVIL ) 800 MG tablet, Take 1 tablet (800 mg total) by mouth every 8 (eight) hours as needed for moderate pain (pain score 4-6)., Disp: 30 tablet, Rfl: 4   sertraline  (ZOLOFT ) 50 MG tablet, Take 1 tablet (50 mg total) by mouth daily., Disp: 30 tablet,  Rfl: 3   traZODone  (DESYREL ) 100 MG tablet, Take 1 tablet (100 mg total) by mouth at bedtime., Disp: 90 tablet, Rfl: 1   Vitamin D , Ergocalciferol , (DRISDOL ) 1.25 MG (50000 UNIT) CAPS capsule, Take 1 capsule (50,000 Units total) by mouth every 7 (seven) days., Disp: 12 capsule, Rfl: 0  Allergies  Allergen Reactions   Penicillins Hives, Nausea And Vomiting and Rash    Has patient had a PCN reaction causing immediate rash, facial/tongue/throat swelling, SOB or lightheadedness with hypotension: no Has patient had a PCN reaction causing severe rash involving mucus membranes or skin necrosis: unknown Has patient had a PCN reaction that required hospitalization unknown Has patient had a PCN reaction occurring within the last 10 years: unknown If all of the above answers are NO, then may proceed with Cephalosporin use.  Has patient had a PCN reaction causing immediate rash, facial/tongue/throat swelling, SOB or lightheadedness with hypotension: no Has patient had a PCN reaction causing severe rash involving mucus membranes or skin necrosis: unknown Has patient had a PCN reaction that required hospitalization unknown Has patient had a PCN reaction occurring within the last 10 years: unknown If all of the above answers are NO, then may proceed with Cephalosporin use. Has patient had a PCN reaction causing immediat    Objective:   There were no vitals taken for this visit. AAOx3, NAD NCAT, EOMI No obvious CN deficits Coloring WNL Pt is able to speak clearly, coherently without shortness of breath or increased work of breathing.  Thought process is linear.  Mood  is appropriate.   Assessment and Plan:   Anxiety/Depression- pt needs to restart Wellbutrin .  Since she has been off medication for weeks, will restart at lower dose.  New prescription sent to pharmacy.  Pt expressed understanding and is in agreement w/ plan.    Comer Greet, MD 07/31/2024

## 2024-08-22 ENCOUNTER — Inpatient Hospital Stay: Attending: Hematology and Oncology

## 2024-08-22 ENCOUNTER — Other Ambulatory Visit: Payer: Self-pay

## 2024-08-22 DIAGNOSIS — D500 Iron deficiency anemia secondary to blood loss (chronic): Secondary | ICD-10-CM | POA: Insufficient documentation

## 2024-08-22 DIAGNOSIS — N920 Excessive and frequent menstruation with regular cycle: Secondary | ICD-10-CM | POA: Insufficient documentation

## 2024-08-28 ENCOUNTER — Telehealth: Payer: Self-pay | Admitting: Physician Assistant

## 2024-08-29 ENCOUNTER — Inpatient Hospital Stay: Admitting: Physician Assistant

## 2024-08-30 ENCOUNTER — Other Ambulatory Visit: Payer: Self-pay | Admitting: Family Medicine

## 2024-09-03 ENCOUNTER — Inpatient Hospital Stay

## 2024-09-03 DIAGNOSIS — D500 Iron deficiency anemia secondary to blood loss (chronic): Secondary | ICD-10-CM | POA: Diagnosis present

## 2024-09-03 DIAGNOSIS — N920 Excessive and frequent menstruation with regular cycle: Secondary | ICD-10-CM | POA: Diagnosis present

## 2024-09-03 LAB — CBC WITH DIFFERENTIAL (CANCER CENTER ONLY)
Abs Immature Granulocytes: 0.01 K/uL (ref 0.00–0.07)
Basophils Absolute: 0 K/uL (ref 0.0–0.1)
Basophils Relative: 1 %
Eosinophils Absolute: 0.1 K/uL (ref 0.0–0.5)
Eosinophils Relative: 1 %
HCT: 35.1 % — ABNORMAL LOW (ref 36.0–46.0)
Hemoglobin: 11.5 g/dL — ABNORMAL LOW (ref 12.0–15.0)
Immature Granulocytes: 0 %
Lymphocytes Relative: 18 %
Lymphs Abs: 1.2 K/uL (ref 0.7–4.0)
MCH: 27.4 pg (ref 26.0–34.0)
MCHC: 32.8 g/dL (ref 30.0–36.0)
MCV: 83.6 fL (ref 80.0–100.0)
Monocytes Absolute: 0.4 K/uL (ref 0.1–1.0)
Monocytes Relative: 6 %
Neutro Abs: 4.6 K/uL (ref 1.7–7.7)
Neutrophils Relative %: 74 %
Platelet Count: 352 K/uL (ref 150–400)
RBC: 4.2 MIL/uL (ref 3.87–5.11)
RDW: 13.6 % (ref 11.5–15.5)
WBC Count: 6.3 K/uL (ref 4.0–10.5)
nRBC: 0 % (ref 0.0–0.2)

## 2024-09-03 LAB — CMP (CANCER CENTER ONLY)
ALT: 7 U/L (ref 0–44)
AST: 18 U/L (ref 15–41)
Albumin: 4 g/dL (ref 3.5–5.0)
Alkaline Phosphatase: 85 U/L (ref 38–126)
Anion gap: 10 (ref 5–15)
BUN: 8 mg/dL (ref 6–20)
CO2: 24 mmol/L (ref 22–32)
Calcium: 9.6 mg/dL (ref 8.9–10.3)
Chloride: 104 mmol/L (ref 98–111)
Creatinine: 0.75 mg/dL (ref 0.44–1.00)
GFR, Estimated: >60 mL/min (ref >60)
Glucose, Bld: 93 mg/dL (ref 70–99)
Potassium: 4.4 mmol/L (ref 3.5–5.1)
Sodium: 137 mmol/L (ref 135–145)
Total Bilirubin: 0.3 mg/dL (ref 0.0–1.2)
Total Protein: 8.1 g/dL (ref 6.5–8.1)

## 2024-09-03 LAB — IRON AND IRON BINDING CAPACITY (CC-WL,HP ONLY)
Iron: 28 ug/dL (ref 28–170)
Saturation Ratios: 9 % — ABNORMAL LOW (ref 10.4–31.8)
TIBC: 301 ug/dL (ref 250–450)
UIBC: 273 ug/dL

## 2024-09-03 LAB — FERRITIN: Ferritin: 48 ng/mL (ref 11–307)

## 2024-09-09 ENCOUNTER — Telehealth: Payer: Self-pay

## 2024-09-09 ENCOUNTER — Inpatient Hospital Stay: Admitting: Physician Assistant

## 2024-09-09 ENCOUNTER — Encounter: Payer: Self-pay | Admitting: Physician Assistant

## 2024-09-09 DIAGNOSIS — D500 Iron deficiency anemia secondary to blood loss (chronic): Secondary | ICD-10-CM

## 2024-09-09 NOTE — Progress Notes (Signed)
 [facility] [phone]   [phone]  PROGRESS NOTE  I connected with Krystal Harris  on 09/09/24 by telephone visit and verified that I am speaking with the correct person using two identifiers.   I discussed the limitations, risks, security and privacy concerns of performing an evaluation and management service by telemedicine and the availability of in-person appointments. I also discussed with the patient that there may be a patient responsible charge related to this service. The patient expressed understanding and agreed to proceed.  Patient's location: Home Provider's location: Office  Patient Care Team: Mahlon Comer BRAVO, MD as PCP - General (Family Medicine) Rutherford Gain, MD as Consulting Physician (Obstetrics and Gynecology)  Hematological/Oncological History # Iron  Deficiency Anemia 2/2 to GYN Bleeding 06/06/2022: WBC 6.8, Hgb 9.7, MCV 78, Plt 436, ferritin 5, iron  sat 6% 11/16/2022: WBC 5.9, Hgb 11.2, MCV 84, Plt 373, Iron  sat 8%, ferritin 8 12/11/2022: establish care with Dr. Federico  12/25/2022-01/01/2023: Received IV feraheme  510 mg x 2 doses 08/09/2023:  IV feraheme  510 mg  03/03/24-03/18/24: Received IV venofer  200 mg x 3 doses  CHIEF COMPLAINTS/PURPOSE OF CONSULTATION:   Iron  Deficiency Anemia 2/2 to GYN Bleeding   HISTORY OF PRESENTING ILLNESS:  Krystal Harris 38 y.o. female returns for a follow up for iron  deficiency anemia.  Patient was last evaluated on 02/15/24. In the interim, she received IV venofer  x 3 doses and underwent resection of fibroids on 04/04/2024.   [name] reports her energy level are fairly unchanged.  She has fatigue but contributes this to having  a lot on her plate .  She adds that her energy levels are impacted by the stress in her life. She recently resumed Wellbutrin  therapy. She reports her menstrual cycles have improved since undergoing fibroid resection.. Her last cycle was two weeks. late but  lasted 5 days with only one day of heavy bleeding. She denies fevers, chills, sweats, shortness of breath, chest pain, nausea, vomiting, bowel habit changes, overt signs of bleeding or pica. Rest of the ROS is below.   MEDICAL HISTORY:  Past Medical History:  Diagnosis Date   Anemia    Anxiety    Chicken pox    Depression    Gallbladder problem    GERD (gastroesophageal reflux disease)    PONV (postoperative nausea and vomiting)    vomiting after surgery gallbladder removal 2016   Vitamin D  deficiency     SURGICAL HISTORY: Past Surgical History:  Procedure Laterality Date   BRAVO PH STUDY N/A 01/22/2020   Procedure: BRAVO PH STUDY;  Surgeon: Teressa Toribio SQUIBB, MD;  Location: [facility];  Service: Endoscopy;  Laterality: N/A;   CHOLECYSTECTOMY     laprascopic   CYST EXCISION Right 12/29/2016   Procedure: EXCISION OF RIGHT AXILLARY CYST;  Surgeon: Lynda Leos, MD;  Location: [facility];  Service: General;  Laterality: Right;   DILATATION & CURETTAGE/HYSTEROSCOPY WITH MYOSURE N/A 05/05/2022   Procedure: DILATATION & CURETTAGE/HYSTEROSCOPY WITH MYOSURE;  Surgeon: Rutherford Gain, MD;  Location: [facility];  Service: Gynecology;  Laterality: N/A;   ESOPHAGOGASTRODUODENOSCOPY N/A 08/04/2020   Procedure: ESOPHAGOGASTRODUODENOSCOPY (EGD) with balloon dilation;  Surgeon: Leos Lynda, MD;  Location: [facility];  Service: General;  Laterality: N/A;   ESOPHAGOGASTRODUODENOSCOPY (EGD) WITH PROPOFOL  N/A 01/22/2020   Procedure: ESOPHAGOGASTRODUODENOSCOPY (EGD) WITH PROPOFOL ;  Surgeon: Teressa Toribio SQUIBB, MD;  Location: [facility];  Service: Endoscopy;  Laterality: N/A;   FOOT SURGERY  02/2015   scar tissue and  keloid scar removal   FOREIGN BODY REMOVAL Right 09/17/2013   Procedure: REMOVAL FOREIGN BODY NEEDLE PLANTAR ASPECT OF THE FOOT;  Surgeon: Jerona LULLA Sage, MD;  Location: [facility];  Service: Orthopedics;  Laterality: Right;  Remove Foreign Body Right Foot   HYSTEROSCOPY WITH D & C   04/04/2024   Procedure: DILATATION AND CURETTAGE /HYSTEROSCOPY;  Surgeon: Rutherford Gain, MD;  Location: [facility];  Service: Gynecology;;   HYSTEROSCOPY WITH MYOMECTOMY N/A 04/04/2024   Procedure: HYSTEROSCOPY WITH MYOMECTOMY USING MYOSURE;  Surgeon: Rutherford Gain, MD;  Location: [facility];  Service: Gynecology;  Laterality: N/A;  confirmed on 6/16 rep will cover case CS   LAPAROSCOPIC NISSEN FUNDOPLICATION  06/01/2020   WISDOM TOOTH EXTRACTION      SOCIAL HISTORY: Social History   Socioeconomic History   Marital status: Harris    Spouse name: Not on file   Number of children: Not on file   Years of education: Not on file   Highest education level: Master's degree (e.g., MA, MS, MEng, MEd, MSW, MBA)  Occupational History   Not on file  Tobacco Use   Smoking status: Never   Smokeless tobacco: Never  Vaping Use   Vaping status: Never Used  Substance and Sexual Activity   Alcohol use: Yes    Comment: occasionally   Drug use: No   Sexual activity: Yes  Other Topics Concern   Not on file  Social History Narrative   Not on file   Social Drivers of Health   Financial Resource Strain: Not on file  Food Insecurity: Not on file  Transportation Needs: Not on file  Physical Activity: Unknown (05/17/2023)   Exercise Vital Sign    Days of Exercise per Week: 0 days    Minutes of Exercise per Session: Not on file  Stress: No Stress Concern Present (05/17/2023)   [employer] of Occupational Health - Occupational Stress Questionnaire    Feeling of Stress : Only a little  Social Connections: Unknown (05/17/2023)   Social Connection and Isolation Panel    Frequency of Communication with Friends and Family: More than three times a week    Frequency of Social Gatherings with Friends and Family: Twice a week    Attends Religious Services: Not on Marketing Executive or Organizations: No    Attends Engineer, Structural: Not on file    Marital Status: Never married   Catering Manager Violence: Not on file    FAMILY HISTORY: Family History  Problem Relation Age of Onset   Diabetes Mother    Hypertension Mother    Thyroid  disease Mother    Obesity Mother    Stroke Maternal Grandmother    Diabetes Maternal Grandfather    Stroke Maternal Grandfather    Hypertension Maternal Grandfather    Lung cancer Maternal Grandfather    Stomach cancer Neg Hx    Colon cancer Neg Hx    Rectal cancer Neg Hx    Pancreatic cancer Neg Hx     ALLERGIES:  is allergic to penicillins.  MEDICATIONS:  Current Outpatient Medications  Medication Sig Dispense Refill   buPROPion  (WELLBUTRIN  XL) 150 MG 24 hr tablet TAKE 1 TABLET BY MOUTH EVERY DAY 90 tablet 2   Cholecalciferol (VITAMIN D3 PO) Take 3,000 Units by mouth daily at 12 noon.      ibuprofen  (ADVIL ) 800 MG tablet Take 1 tablet (800 mg total) by mouth every 8 (eight) hours as needed for moderate pain (  pain score 4-6). 30 tablet 4   sertraline  (ZOLOFT ) 50 MG tablet Take 1 tablet (50 mg total) by mouth daily. 30 tablet 3   traZODone  (DESYREL ) 100 MG tablet Take 1 tablet (100 mg total) by mouth at bedtime. 90 tablet 1   Vitamin D , Ergocalciferol , (DRISDOL ) 1.25 MG (50000 UNIT) CAPS capsule Take 1 capsule (50,000 Units total) by mouth every 7 (seven) days. 12 capsule 0   No current facility-administered medications for this visit.    REVIEW OF SYSTEMS:   Constitutional: ( - ) fevers, ( - )  chills , ( - ) night sweats Eyes: ( - ) blurriness of vision, ( - ) double vision, ( - ) watery eyes Ears, nose, mouth, throat, and face: ( - ) mucositis, ( - ) sore throat Respiratory: ( - ) cough, ( - ) dyspnea, ( - ) wheezes Cardiovascular: ( - ) palpitation, ( - ) chest discomfort, ( - ) lower extremity swelling Gastrointestinal:  ( - ) nausea, ( - ) heartburn, ( - ) change in bowel habits Skin: ( - ) abnormal skin rashes Lymphatics: ( - ) new lymphadenopathy, ( - ) easy bruising Neurological: ( - ) numbness, ( - )  tingling, ( - ) new weaknesses Behavioral/Psych: ( - ) mood change, ( - ) new changes  All other systems were reviewed with the patient and are negative.  PHYSICAL EXAMINATION: Not performed due to virtual visit.   LABORATORY DATA:  I have reviewed the data as listed    Latest Ref Rng & Units 09/03/2024    9:05 AM 04/04/2024    7:40 AM 02/26/2024    8:34 AM  CBC  WBC 4.0 - 10.5 K/uL 6.3  7.5  6.0   Hemoglobin 12.0 - 15.0 g/dL 88.4  88.0  88.1   Hematocrit 36.0 - 46.0 % 35.1  37.7  37.5   Platelets 150 - 400 K/uL 352  377  357.0        Latest Ref Rng & Units 09/03/2024    9:05 AM 02/26/2024    8:34 AM 02/01/2024    9:57 AM  CMP  Glucose 70 - 99 mg/dL 93  87  89   BUN 6 - 20 mg/dL 8  8  10    Creatinine 0.44 - 1.00 mg/dL 9.24  9.26  9.16   Sodium 135 - 145 mmol/L 137  137  137   Potassium 3.5 - 5.1 mmol/L 4.4  4.0  4.3   Chloride 98 - 111 mmol/L 104  104  106   CO2 22 - 32 mmol/L 24  26  24    Calcium 8.9 - 10.3 mg/dL 9.6  9.1  9.3   Total Protein 6.5 - 8.1 g/dL 8.1  7.9  8.4   Total Bilirubin 0.0 - 1.2 mg/dL 0.3  0.3  0.3   Alkaline Phos 38 - 126 U/L 85  74  72   AST 15 - 41 U/L 18  14  14    ALT 0 - 44 U/L 7  9  7       ASSESSMENT & PLAN Krystal Harris returns for a follow up for iron  deficiency anemia.   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  --unable to tolerate ferrous sulfate  325 mg due to GI upset. Taking Flintstone vitamin + iron .  --Last received IV venofer  200  mg x 3 doses from 03/03/2024-03/18/2024 --Labs from 09/03/2024 showed borderline anemia with Hgb 11.5, MCV 83.6. Iron  panel shows some deficiency with saturation 9%, ferritin 48.  -- Recommend IV venofer  200 mg x 3 doses to bolster levels -- Patient will follow up with our team when she becomes pregnant so we can closely monitor the iron  levels  throughout her pregnancy.  --RTC in 3 months for labs only and 6 months for labs/follow up.   No orders of the defined types were placed in this encounter.   All questions were answered. The patient knows to call the clinic with any problems, questions or concerns.  I have spent a total of 25 minutes minutes of  non-face-to-face time, preparing to see the patient,  counseling and educating the patient, documenting clinical information in the electronic health record, and care coordination.    Johnston Police PA-C Dept of Hematology and Oncology [facility] Phone: [phone]  09/09/2024 9:16 AM

## 2024-09-09 NOTE — Telephone Encounter (Signed)
 Johnston, patient will be scheduled as soon as possible.  Auth Submission: NO AUTH NEEDED Site of care: Site of care: [facility] Payer: [employer] state health plan Medication & CPT/J Code(s) submitted: Venofer  (Iron  Sucrose) J1756 Diagnosis Code:  Route of submission (phone, fax, portal):  Phone # Fax # Auth type: Buy/Bill PB Units/visits requested: 200mg  x 3 doses Reference number:  Approval from: 09/09/24 to 10/15/24

## 2024-09-16 ENCOUNTER — Ambulatory Visit (INDEPENDENT_AMBULATORY_CARE_PROVIDER_SITE_OTHER)

## 2024-09-16 VITALS — BP 130/83 | HR 95 | Temp 98.7°F | Resp 18 | Ht 64.0 in | Wt 277.8 lb

## 2024-09-16 DIAGNOSIS — D509 Iron deficiency anemia, unspecified: Secondary | ICD-10-CM | POA: Diagnosis not present

## 2024-09-16 MED ORDER — IRON SUCROSE 200 MG IVPB - SIMPLE MED
200.0000 mg | Freq: Once | Status: AC
  Administered 2024-09-16: 200 mg via INTRAVENOUS
  Filled 2024-09-16: qty 110

## 2024-09-16 NOTE — Progress Notes (Signed)
 Diagnosis: Iron  Deficiency Anemia  Provider:  Mannam, Praveen MD  Procedure: IV Infusion  IV Type: Peripheral, IV Location: R Antecubital   Venofer  (Iron  Sucrose), Dose: 200 mg  Infusion Start Time: 0834  Infusion Stop Time: 0851  Post Infusion IV Care: Patient declined observation and Peripheral IV Discontinued  Discharge: Condition: Good, Destination: Home . AVS Provided  Performed by:  Maximiano JONELLE Pouch, LPN

## 2024-09-22 ENCOUNTER — Ambulatory Visit

## 2024-09-22 VITALS — BP 114/62 | HR 90 | Temp 98.6°F | Resp 18 | Ht 64.0 in | Wt 279.2 lb

## 2024-09-22 DIAGNOSIS — D509 Iron deficiency anemia, unspecified: Secondary | ICD-10-CM

## 2024-09-22 MED ORDER — IRON SUCROSE 200 MG IVPB - SIMPLE MED
200.0000 mg | Freq: Once | Status: AC
  Administered 2024-09-22: 200 mg via INTRAVENOUS

## 2024-09-22 NOTE — Progress Notes (Signed)
 Diagnosis: Acute Anemia  Provider:  Lonna Coder MD  Procedure: IV Infusion  IV Type: Peripheral, IV Location: L Forearm  Venofer  (Iron  Sucrose), Dose: 200 mg  Infusion Start Time: 1258  Infusion Stop Time: 1315  Post Infusion IV Care: Observation period completed and Peripheral IV Discontinued  Discharge: Condition: Good, Destination: Home . AVS Declined  Performed by:  Donny Childes, RN

## 2024-09-29 ENCOUNTER — Ambulatory Visit

## 2024-10-03 ENCOUNTER — Ambulatory Visit

## 2024-10-03 MED ORDER — IRON SUCROSE 200 MG IVPB - SIMPLE MED: 200.0000 mg | Freq: Once | Status: DC

## 2024-10-07 ENCOUNTER — Ambulatory Visit (INDEPENDENT_AMBULATORY_CARE_PROVIDER_SITE_OTHER)

## 2024-10-07 VITALS — BP 133/83 | HR 88 | Temp 97.7°F | Resp 18 | Ht 64.0 in | Wt 279.0 lb

## 2024-10-07 DIAGNOSIS — D509 Iron deficiency anemia, unspecified: Secondary | ICD-10-CM | POA: Diagnosis not present

## 2024-10-07 MED ORDER — IRON SUCROSE 200 MG IVPB - SIMPLE MED
200.0000 mg | Freq: Once | Status: AC
  Administered 2024-10-07: 200 mg via INTRAVENOUS

## 2024-10-07 NOTE — Progress Notes (Signed)
 Diagnosis: Iron  Deficiency Anemia  Provider:  Praveen Mannam MD  Procedure: IV Infusion  IV Type: Peripheral, IV Location: R Forearm   Venofer  (Iron  Sucrose), Dose: 200 mg  Infusion Start Time: 1457  Infusion Stop Time: 1517  Post Infusion IV Care: Patient declined observation and Peripheral IV Discontinued  Discharge: Condition: Good, Destination: Home . AVS Declined  Performed by:  Maximiano JONELLE Pouch, LPN

## 2024-10-13 ENCOUNTER — Other Ambulatory Visit: Payer: Self-pay | Admitting: Obstetrics and Gynecology

## 2024-11-12 ENCOUNTER — Encounter: Payer: Self-pay | Admitting: Family Medicine

## 2024-11-12 ENCOUNTER — Ambulatory Visit: Admitting: Family Medicine

## 2024-11-12 VITALS — BP 132/82 | HR 80 | Ht 64.0 in | Wt 273.0 lb

## 2024-11-12 DIAGNOSIS — J069 Acute upper respiratory infection, unspecified: Secondary | ICD-10-CM | POA: Diagnosis not present

## 2024-11-12 MED ORDER — PROMETHAZINE-DM 6.25-15 MG/5ML PO SYRP: 5.0000 mL | ORAL_SOLUTION | Freq: Four times a day (QID) | ORAL | 0 refills | Status: AC | PRN

## 2024-11-12 MED ORDER — ALBUTEROL SULFATE HFA 108 (90 BASE) MCG/ACT IN AERS: 2.0000 | INHALATION_SPRAY | Freq: Four times a day (QID) | RESPIRATORY_TRACT | 0 refills | Status: AC | PRN

## 2024-11-12 NOTE — Progress Notes (Signed)
" ° °  Subjective:    Patient ID: Krystal Harris Single, female    DOB: 1986-03-12, 39 y.o.   MRN: [id number]  HPI URI- sxs started w/ sneezing, congestion, and she initially thought it was allergies.  Since then, worsening sinus pressure, HA, tooth pain.  Some improvement w/ Sudafed but then developed cough.  Cough is productive.  No fevers/chills/body aches.  + sick contacts.   Review of Systems For ROS see HPI     Objective:   Physical Exam Vitals reviewed.  Constitutional:      General: She is not in acute distress.    Appearance: Normal appearance. She is not ill-appearing.  HENT:     Head: Normocephalic and atraumatic.     Right Ear: Tympanic membrane and ear canal normal.     Left Ear: Tympanic membrane and ear canal normal.     Nose: Congestion present.     Comments: No TTP over frontal or maxillary sinuses    Mouth/Throat:     Mouth: Mucous membranes are moist.     Pharynx: No oropharyngeal exudate or posterior oropharyngeal erythema.  Eyes:     Extraocular Movements: Extraocular movements intact.     Conjunctiva/sclera: Conjunctivae normal.  Cardiovascular:     Rate and Rhythm: Normal rate and regular rhythm.  Pulmonary:     Effort: Pulmonary effort is normal. No respiratory distress.     Breath sounds: No wheezing or rhonchi.     Comments: + hacking cough Musculoskeletal:     Cervical back: Neck supple.  Lymphadenopathy:     Cervical: No cervical adenopathy.  Neurological:     General: No focal deficit present.     Mental Status: She is alert and oriented to person, place, and time.  Psychiatric:        Mood and Affect: Mood normal.        Behavior: Behavior normal.        Thought Content: Thought content normal.           Assessment & Plan:  URI- new.  Likely viral as there was no evidence of bacterial infxn on PE.  Start cough meds and albuterol  prn.  Reviewed supportive care and red flags that should prompt return.  Pt expressed understanding and is in  agreement w/ plan.  "

## 2024-11-12 NOTE — Patient Instructions (Signed)
 Follow up as needed or as scheduled USE the Albuterol  inhaler- 2 puffs as needed for cough, wheezing, or shortness of breath USE the prescription cough syrup for nights and weekends b/c it may cause drowsiness Try Delsym, Robitussin, or Mucinex  DM for daytime cough Drink LOTS of fluids REST! Call with any questions or concerns Hang in there!

## 2024-11-16 ENCOUNTER — Other Ambulatory Visit: Payer: Self-pay | Admitting: Family Medicine

## 2024-11-16 DIAGNOSIS — G4700 Insomnia, unspecified: Secondary | ICD-10-CM

## 2024-11-17 NOTE — Telephone Encounter (Signed)
 Requested Prescriptions   Pending Prescriptions Disp Refills   traZODone  (DESYREL ) 100 MG tablet [Pharmacy Med Name: TRAZODONE  100 MG TABLET] 90 tablet 1    Sig: TAKE 1 TABLET BY MOUTH EVERYDAY AT BEDTIME     Date of patient request: 11/17/2024 Last office visit: 11/12/2024 Upcoming visit: Visit date not found Date of last refill: 02/26/2024 Last refill amount: 90

## 2024-12-01 ENCOUNTER — Encounter (HOSPITAL_COMMUNITY): Admission: RE | Payer: Self-pay | Source: Home / Self Care

## 2024-12-08 ENCOUNTER — Ambulatory Visit (HOSPITAL_COMMUNITY): Admit: 2024-12-08 | Admitting: Obstetrics and Gynecology
